# Patient Record
Sex: Female | Born: 1985 | Race: Black or African American | Hispanic: No | Marital: Married | Smoking: Never smoker
Health system: Southern US, Community
[De-identification: ages and names within clinical notes are randomized; demographics above are authoritative.]

## PROBLEM LIST (undated history)

## (undated) DIAGNOSIS — N83209 Unspecified ovarian cyst, unspecified side: Secondary | ICD-10-CM

## (undated) DIAGNOSIS — J45909 Unspecified asthma, uncomplicated: Secondary | ICD-10-CM

## (undated) DIAGNOSIS — E559 Vitamin D deficiency, unspecified: Secondary | ICD-10-CM

## (undated) DIAGNOSIS — F419 Anxiety disorder, unspecified: Secondary | ICD-10-CM

## (undated) DIAGNOSIS — D649 Anemia, unspecified: Secondary | ICD-10-CM

## (undated) DIAGNOSIS — M1990 Unspecified osteoarthritis, unspecified site: Secondary | ICD-10-CM

## (undated) DIAGNOSIS — N809 Endometriosis, unspecified: Secondary | ICD-10-CM

## (undated) DIAGNOSIS — K219 Gastro-esophageal reflux disease without esophagitis: Secondary | ICD-10-CM

## (undated) DIAGNOSIS — T7840XA Allergy, unspecified, initial encounter: Secondary | ICD-10-CM

## (undated) HISTORY — DX: Endometriosis, unspecified: N80.9

## (undated) HISTORY — DX: Allergy, unspecified, initial encounter: T78.40XA

## (undated) HISTORY — DX: Unspecified asthma, uncomplicated: J45.909

## (undated) HISTORY — PX: HERNIA REPAIR: SHX51

## (undated) HISTORY — PX: WISDOM TOOTH EXTRACTION: SHX21

## (undated) HISTORY — DX: Anxiety disorder, unspecified: F41.9

## (undated) HISTORY — DX: Anemia, unspecified: D64.9

## (undated) HISTORY — DX: Unspecified osteoarthritis, unspecified site: M19.90

---
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
## 2011-10-11 ENCOUNTER — Ambulatory Visit (INDEPENDENT_AMBULATORY_CARE_PROVIDER_SITE_OTHER): Payer: BC Managed Care – PPO

## 2011-10-11 DIAGNOSIS — M79609 Pain in unspecified limb: Secondary | ICD-10-CM

## 2011-10-11 DIAGNOSIS — B079 Viral wart, unspecified: Secondary | ICD-10-CM

## 2011-11-13 ENCOUNTER — Ambulatory Visit (INDEPENDENT_AMBULATORY_CARE_PROVIDER_SITE_OTHER): Payer: BC Managed Care – PPO | Admitting: Family Medicine

## 2011-11-13 DIAGNOSIS — N898 Other specified noninflammatory disorders of vagina: Secondary | ICD-10-CM

## 2011-11-13 DIAGNOSIS — K219 Gastro-esophageal reflux disease without esophagitis: Secondary | ICD-10-CM | POA: Insufficient documentation

## 2011-11-13 DIAGNOSIS — N939 Abnormal uterine and vaginal bleeding, unspecified: Secondary | ICD-10-CM

## 2011-11-13 DIAGNOSIS — J329 Chronic sinusitis, unspecified: Secondary | ICD-10-CM

## 2011-11-13 DIAGNOSIS — J302 Other seasonal allergic rhinitis: Secondary | ICD-10-CM

## 2011-11-13 DIAGNOSIS — J019 Acute sinusitis, unspecified: Secondary | ICD-10-CM

## 2011-11-13 DIAGNOSIS — Z9109 Other allergy status, other than to drugs and biological substances: Secondary | ICD-10-CM

## 2011-11-13 HISTORY — DX: Gastro-esophageal reflux disease without esophagitis: K21.9

## 2011-11-13 MED ORDER — AMOXICILLIN 875 MG PO TABS: 875.0000 mg | ORAL_TABLET | Freq: Two times a day (BID) | ORAL | Status: AC

## 2011-11-13 MED ORDER — FLUTICASONE PROPIONATE 50 MCG/ACT NA SUSP: 2.0000 | Freq: Every day | NASAL | Status: DC

## 2011-11-13 MED ORDER — HYDROCODONE-HOMATROPINE 5-1.5 MG/5ML PO SYRP: 5.0000 mL | ORAL_SOLUTION | Freq: Three times a day (TID) | ORAL | Status: AC | PRN

## 2011-11-13 NOTE — Progress Notes (Signed)
  Subjective:    Patient ID: Stacey Key, female    DOB: Feb 15, 1986, 26 y.o.   MRN: [id number]  Sore Throat  This is a new problem. The current episode started 1 to 4 weeks ago. The problem has been gradually worsening. Neither side of throat is experiencing more pain than the other. There has been no fever. The pain is at a severity of 6/10. The pain is moderate. Associated symptoms include congestion, coughing (productive of yellow sputum), ear pain and headaches.  Cough Associated symptoms include ear pain and headaches.  Vaginal Bleeding Associated symptoms include headaches.  On OCP for dysmenorrhea,recent menses 10/28/11 heavy lasting 5 days. New to current OCP(recently started on 2nd pack).  OCP changed as prior OCP(?name) not  reducing duration/ severity of bleeding.   Review of Systems  HENT: Positive for ear pain and congestion.   Respiratory: Positive for cough (productive of yellow sputum).   Genitourinary: Positive for vaginal bleeding.  Neurological: Positive for headaches.       Objective:   Physical Exam  Constitutional: She appears well-developed and well-nourished.  HENT:  Head: Head is with raccoon's eyes.  Right Ear: Tympanic membrane is retracted.  Left Ear: Tympanic membrane is retracted.  Nose: Mucosal edema (blue swollen turbinates) present.  Mouth/Throat: Posterior oropharyngeal edema: copious yellow pnd.  Neck: Neck supple. No thyromegaly present.  Cardiovascular: Normal rate, regular rhythm and normal heart sounds.   Pulmonary/Chest: Effort normal and breath sounds normal.  Abdominal: Soft. Bowel sounds are normal. She exhibits no mass.  Lymphadenopathy:    Cervical adenopathy: shoddy anterior nodes.          Assessment & Plan:   1. GERD (gastroesophageal reflux disease)    2. Sinusitis  amoxicillin (AMOXIL) 875 MG tablet, HYDROcodone-homatropine (HYCODAN) 5-1.5 MG/5ML syrup  3. Seasonal allergies  fluticasone (FLONASE) 50 MCG/ACT nasal  spray  4. Vaginal bleeding  Complete 3 packs of OCP.  If menses still heavy please RTC for follow up with Dr. Audria Nine   Follow up INB in 2 days

## 2011-12-05 ENCOUNTER — Ambulatory Visit: Payer: Self-pay | Admitting: Family Medicine

## 2011-12-27 ENCOUNTER — Telehealth: Payer: Self-pay

## 2011-12-27 NOTE — Telephone Encounter (Signed)
Pt called to ask for RFs of her BCP that Dr Audria Nine had switched her to after Sprintec didn't work for her. She had been given a 3 month trial to see how it did and the Dr Audria Nine was going to give her add'l RFs if it worked well. Pt stated that she is doing well on it. Dr Audria Nine, it looks like you switched pt to Lo estrin Fe on Oct 4. Pt stated she is taking Junel Fe. I put the pt's chart in your box, wanted to make sure you wanted to RF for rest of the year since her PAP on 06/06/11. Pt wants RFs to [facility]

## 2011-12-31 ENCOUNTER — Other Ambulatory Visit: Payer: Self-pay | Admitting: Family Medicine

## 2011-12-31 MED ORDER — NORETHIN ACE-ETH ESTRAD-FE 1-20 MG-MCG PO TABS: 1.0000 | ORAL_TABLET | Freq: Every day | ORAL | Status: DC

## 2011-12-31 NOTE — Telephone Encounter (Signed)
Medication has been refilled and available for pick-up from [facility] (refilled for 1 year)

## 2011-12-31 NOTE — Progress Notes (Signed)
OCP (Junel Fe) has been prescribed and routed to [facility]'s [facility]. She has RFs for 1 year.

## 2012-01-01 NOTE — Telephone Encounter (Signed)
LMOM that RFs were sent in. 

## 2012-01-10 ENCOUNTER — Ambulatory Visit (HOSPITAL_COMMUNITY)
Admission: RE | Admit: 2012-01-10 | Discharge: 2012-01-10 | Disposition: A | Discharge status: Home / Self Care | Payer: BC Managed Care – PPO | Source: Ambulatory Visit | Attending: Family Medicine | Admitting: Family Medicine

## 2012-01-10 ENCOUNTER — Ambulatory Visit (INDEPENDENT_AMBULATORY_CARE_PROVIDER_SITE_OTHER): Payer: BC Managed Care – PPO | Admitting: Family Medicine

## 2012-01-10 VITALS — BP 118/78 | HR 79 | Temp 98.2°F | Resp 16 | Ht 68.0 in | Wt 136.6 lb

## 2012-01-10 DIAGNOSIS — M79609 Pain in unspecified limb: Secondary | ICD-10-CM

## 2012-01-10 DIAGNOSIS — M79669 Pain in unspecified lower leg: Secondary | ICD-10-CM

## 2012-01-10 DIAGNOSIS — S91009A Unspecified open wound, unspecified ankle, initial encounter: Secondary | ICD-10-CM

## 2012-01-10 DIAGNOSIS — S81009A Unspecified open wound, unspecified knee, initial encounter: Secondary | ICD-10-CM

## 2012-01-10 DIAGNOSIS — M7989 Other specified soft tissue disorders: Secondary | ICD-10-CM

## 2012-01-10 MED ORDER — NABUMETONE 750 MG PO TABS: 750.0000 mg | ORAL_TABLET | Freq: Two times a day (BID) | ORAL | Status: DC

## 2012-01-10 NOTE — Progress Notes (Signed)
Subjective: Patient is here with a left ankle injury. Yesterday she went to the screen door and slammed his finger across the back of her heel. She jerked backwards when it into her, but then kept going forward because she needs to turn the alarm off. She works at [employer] when she is on her feet for long hours. She is a Consulting civil engineer at [employer]. No prior injuries or problems with her ankle before this. It hurts a lot today all the way up into her calf did sustain a laceration across her Achilles area this lead. She showed me the photograph of that.  Objective pleasant young lady in no acute distress is alert she is sitting still. She has to extreme tenderness of the calf, with positive Homans sign. There is a small laceration of the skin overlying the lower Achilles area which is not requiring any special care. His only about 6 mm. Pedal pulses are present. Edema is absent.  Assessment: Include left heel Calf pain, and post trauma in a patient on oral contraception.  Plan: Ultrasound of calf Often 750 twice a day Elevate and rest her leg. Her crutches. Labor for 3 days. Thank you Relafen

## 2012-01-10 NOTE — Progress Notes (Signed)
Call report of lower extremity US negative. Patient advised to follow current treatment plan and follow up as directed.  Eula Listen, PA-C 01/10/2012 4:05 PM

## 2012-01-10 NOTE — Patient Instructions (Addendum)
Rest the ankle for the next few days. Will leave off work through Saturday. Advise crutches.  If the study reveals a blood clot we would have to do things differently, but I have prescribed some medication for the pain and inflammation.  Venous Doppler ultrasound at [facility] hospital

## 2012-03-30 ENCOUNTER — Inpatient Hospital Stay (HOSPITAL_COMMUNITY)
Admission: AD | Admit: 2012-03-30 | Discharge: 2012-03-31 | Disposition: A | Discharge status: Home / Self Care | Payer: BC Managed Care – PPO | Source: Ambulatory Visit | Attending: Obstetrics and Gynecology | Admitting: Obstetrics and Gynecology

## 2012-03-30 ENCOUNTER — Ambulatory Visit (HOSPITAL_COMMUNITY)
Admission: RE | Admit: 2012-03-30 | Discharge: 2012-03-30 | Disposition: A | Discharge status: Home / Self Care | Payer: BC Managed Care – PPO | Source: Ambulatory Visit | Attending: Emergency Medicine | Admitting: Emergency Medicine

## 2012-03-30 ENCOUNTER — Encounter (HOSPITAL_COMMUNITY): Payer: Self-pay | Admitting: *Deleted

## 2012-03-30 ENCOUNTER — Ambulatory Visit (INDEPENDENT_AMBULATORY_CARE_PROVIDER_SITE_OTHER): Payer: BC Managed Care – PPO | Admitting: Emergency Medicine

## 2012-03-30 ENCOUNTER — Inpatient Hospital Stay (HOSPITAL_COMMUNITY): Payer: BC Managed Care – PPO

## 2012-03-30 VITALS — BP 100/65 | HR 66 | Temp 98.2°F | Resp 16 | Ht 68.0 in | Wt 133.6 lb

## 2012-03-30 DIAGNOSIS — N83299 Other ovarian cyst, unspecified side: Secondary | ICD-10-CM

## 2012-03-30 DIAGNOSIS — R109 Unspecified abdominal pain: Secondary | ICD-10-CM

## 2012-03-30 DIAGNOSIS — N83209 Unspecified ovarian cyst, unspecified side: Secondary | ICD-10-CM | POA: Insufficient documentation

## 2012-03-30 DIAGNOSIS — R300 Dysuria: Secondary | ICD-10-CM

## 2012-03-30 DIAGNOSIS — N949 Unspecified condition associated with female genital organs and menstrual cycle: Secondary | ICD-10-CM | POA: Insufficient documentation

## 2012-03-30 LAB — POCT UA - MICROSCOPIC ONLY
Casts, Ur, LPF, POC: NEGATIVE
Crystals, Ur, HPF, POC: NEGATIVE
Mucus, UA: POSITIVE

## 2012-03-30 LAB — URINALYSIS, ROUTINE W REFLEX MICROSCOPIC
Bilirubin Urine: NEGATIVE
Ketones, ur: NEGATIVE mg/dL
Nitrite: NEGATIVE
Protein, ur: NEGATIVE mg/dL
Specific Gravity, Urine: <1.005 — ABNORMAL LOW (ref 1.005–1.030)
Urobilinogen, UA: 0.2 mg/dL (ref 0.0–1.0)

## 2012-03-30 LAB — POCT CBC
Granulocyte percent: 49 %G (ref 37–80)
MCH, POC: 28 pg (ref 27–31.2)
MID (cbc): 0.4 (ref 0–0.9)
MPV: 9 fL (ref 0–99.8)
POC MID %: 6.4 %M (ref 0–12)
Platelet Count, POC: 315 10*3/uL (ref 142–424)
RBC: 4.64 M/uL (ref 4.04–5.48)
RDW, POC: 12.9 %
WBC: 7 10*3/uL (ref 4.6–10.2)

## 2012-03-30 LAB — POCT URINALYSIS DIPSTICK
Blood, UA: NEGATIVE
Protein, UA: NEGATIVE
Spec Grav, UA: 1.025
Urobilinogen, UA: 0.2

## 2012-03-30 MED ORDER — SODIUM CHLORIDE 0.9 % IV SOLN
INTRAVENOUS | Status: DC
  Administered 2012-03-30: via INTRAVENOUS

## 2012-03-30 MED ORDER — DEXTROSE 5 % IV SOLN
1.0000 g | Freq: Once | INTRAVENOUS | Status: AC
  Administered 2012-03-31: 1 g via INTRAVENOUS
  Filled 2012-03-30: qty 10

## 2012-03-30 MED ORDER — HYDROMORPHONE HCL PF 1 MG/ML IJ SOLN
1.0000 mg | Freq: Once | INTRAMUSCULAR | Status: AC
  Administered 2012-03-31: 1 mg via INTRAVENOUS
  Filled 2012-03-30: qty 1

## 2012-03-30 MED ORDER — ONDANSETRON HCL 4 MG/2ML IJ SOLN
4.0000 mg | Freq: Once | INTRAMUSCULAR | Status: AC
  Administered 2012-03-31: 4 mg via INTRAVENOUS
  Filled 2012-03-30: qty 2

## 2012-03-30 MED ORDER — IOHEXOL 300 MG/ML  SOLN
80.0000 mL | Freq: Once | INTRAMUSCULAR | Status: AC | PRN
  Administered 2012-03-30: 80 mL via INTRAVENOUS

## 2012-03-30 NOTE — MAU Note (Signed)
Pt seen at [facility], dx with ruptured ovarian cyst, sent to MAU for further evaluation.

## 2012-03-30 NOTE — MAU Provider Note (Signed)
History     CSN: [id number]  Arrival date & time 03/30/12  2114   None     Chief Complaint  Patient presents with  . Abdominal Pain  . Ovarian Cyst    HPI Stacey Key is a 26 y.o. female who was sent to [facility]U for [facility] for evaluation of ruptured ovarian cyst. The patient is not pregnant. LMP 03/23/12 and was normal , last pap < one year ago and was normal , last sexual intercourse = never. Takes OC's for severe cramping with menses. Onset of pain in lower abdomen at 5 am today. Sudden onset across lower abdomen. Rates the the pain as 10/10 at onset and 6/10 now. Nausea but no vomiting. The history was provided by the patient and her medical record.  Past Medical History  Diagnosis Date  . No pertinent past medical history     Past Surgical History  Procedure Date  . Hernia repair     Family History  Problem Relation Age of Onset  . Other Neg Hx     History  Substance Use Topics  . Smoking status: Never Smoker   . Smokeless tobacco: Not on file  . Alcohol Use: No    OB History    Grav Para Term Preterm Abortions TAB SAB Ect Mult Living   0               Review of Systems  Constitutional: Negative for fever, chills, diaphoresis and fatigue.  HENT: Negative for ear pain, congestion, sore throat, facial swelling, neck pain, neck stiffness, dental problem and sinus pressure.   Eyes: Negative for photophobia, pain, discharge and visual disturbance.  Respiratory: Negative for cough, chest tightness and wheezing.   Cardiovascular: Negative for chest pain and palpitations.  Gastrointestinal: Positive for nausea, abdominal pain and constipation. Negative for vomiting, diarrhea and abdominal distention.  Genitourinary: Positive for frequency. Negative for dysuria, flank pain, vaginal bleeding, vaginal discharge and difficulty urinating. Pelvic pain: pain with urination.  Musculoskeletal: Positive for back pain. Negative for myalgias and gait problem.    Skin: Negative for color change and rash.  Neurological: Negative for dizziness, speech difficulty, weakness, light-headedness, numbness and headaches.  Psychiatric/Behavioral: Negative for confusion and agitation. The patient is not nervous/anxious.     Allergies  Review of patient's allergies indicates no known allergies.  Home Medications   Current Outpatient Rx  Name Route Sig Dispense Refill  . HYDROCODONE-ACETAMINOPHEN 5-325 MG PO TABS Oral Take 1 tablet by mouth every 4 (four) hours as needed for pain. 10 tablet 0  . IBUPROFEN 600 MG PO TABS Oral Take 1 tablet (600 mg total) by mouth every 6 (six) hours as needed for pain. 30 tablet 0  . PROMETHAZINE HCL 25 MG PO TABS Oral Take 0.5 tablets (12.5 mg total) by mouth every 6 (six) hours as needed for nausea. 20 tablet 0    BP 117/72  Pulse 83  Temp 98.6 F (37 C) (Oral)  Resp 16  Ht 5\' 8"  (1.727 m)  Wt 135 lb 9.6 oz (61.508 kg)  BMI 20.62 kg/m2  LMP 03/23/2012  Physical Exam  Nursing note and vitals reviewed. Constitutional: She is oriented to person, place, and time. She appears well-developed and well-nourished. No distress.  HENT:  Head: Normocephalic.  Eyes: EOM are normal.  Neck: Neck supple.  Cardiovascular: Normal rate.   Pulmonary/Chest: Effort normal.  Abdominal: Soft. There is tenderness in the right lower quadrant and left lower quadrant.  There is no rebound and no CVA tenderness.  Musculoskeletal: Normal range of motion.  Neurological: She is alert and oriented to person, place, and time. No cranial nerve deficit.  Skin: Skin is warm and dry.  Psychiatric: She has a normal mood and affect. Her behavior is normal. Judgment and thought content normal.    Results for orders placed during the hospital encounter of 03/30/12 (from the past 24 hour(s))  URINALYSIS, ROUTINE W REFLEX MICROSCOPIC     Status: Abnormal   Collection Time   03/30/12  9:55 PM      Component Value Range   Color, Urine YELLOW  YELLOW    APPearance CLEAR  CLEAR   Specific Gravity, Urine <1.005 (*) 1.005 - 1.030   pH 6.0  5.0 - 8.0   Glucose, UA NEGATIVE  NEGATIVE mg/dL   Hgb urine dipstick NEGATIVE  NEGATIVE   Bilirubin Urine NEGATIVE  NEGATIVE   Ketones, ur NEGATIVE  NEGATIVE mg/dL   Protein, ur NEGATIVE  NEGATIVE mg/dL   Urobilinogen, UA 0.2  0.0 - 1.0 mg/dL   Nitrite NEGATIVE  NEGATIVE   Leukocytes, UA NEGATIVE  NEGATIVE   ED Course  Procedures    US Transvaginal Non-ob  03/30/2012  *RADIOLOGY REPORT*  Clinical Data: Pelvic pain  TRANSABDOMINAL AND TRANSVAGINAL ULTRASOUND OF PELVIS Technique:  Both transabdominal and transvaginal ultrasound examinations of the pelvis were performed. Transabdominal technique was performed for global imaging of the pelvis including uterus, ovaries, adnexal regions, and pelvic cul-de-sac.  It was necessary to proceed with endovaginal exam following the transabdominal exam to visualize the endometrium and adnexa.  Comparison:  03/30/2012 CT  Findings:  Uterus: Measures 8.3 x 3.6 x 5.2 cm.  There are two intramural fibroids, measuring 1.0 by 0.6 x 0.8 cm anteriorly and 1.1 x 1.1 x 1.2 cm of the fundus.  Endometrium: Measures 9 mm in thickness, normal sonographic appearance.  Right ovary:  Measures 2.4 x 1.0 x 1.6 cm.  Left ovary: Measures 6.9 x 3.3 x 3.5 cm, with a heterogeneous echogenicity mass measuring 5.1 x 3.3 x 3.1 cm with internal cystic components and no internal vascularity. There is an adjacent anechoic cyst which has either paraovarian or also arising from the ovary.  Other findings: Small amount of free fluid.  Color Doppler flow with arterial and venous wave forms documented to both ovaries.  IMPRESSION: Color Doppler flow with arterial and venous wave forms documented to both ovaries.  The cystic lesion questioned on today's CT corresponds to a complex left ovarian lesion, favored to reflect a hemorrhagic cyst. Recommend 6-week ultrasound follow-up to document resolution.  Small  amount of free fluid.  Original Report Authenticated By: Waneta Martins, M.D.   US Pelvis Complete  03/30/2012  *RADIOLOGY REPORT*  Clinical Data: Pelvic pain  TRANSABDOMINAL AND TRANSVAGINAL ULTRASOUND OF PELVIS Technique:  Both transabdominal and transvaginal ultrasound examinations of the pelvis were performed. Transabdominal technique was performed for global imaging of the pelvis including uterus, ovaries, adnexal regions, and pelvic cul-de-sac.  It was necessary to proceed with endovaginal exam following the transabdominal exam to visualize the endometrium and adnexa.  Comparison:  03/30/2012 CT  Findings:  Uterus: Measures 8.3 x 3.6 x 5.2 cm.  There are two intramural fibroids, measuring 1.0 by 0.6 x 0.8 cm anteriorly and 1.1 x 1.1 x 1.2 cm of the fundus.  Endometrium: Measures 9 mm in thickness, normal sonographic appearance.  Right ovary:  Measures 2.4 x 1.0 x 1.6 cm.  Left ovary:  Measures 6.9 x 3.3 x 3.5 cm, with a heterogeneous echogenicity mass measuring 5.1 x 3.3 x 3.1 cm with internal cystic components and no internal vascularity. There is an adjacent anechoic cyst which has either paraovarian or also arising from the ovary.  Other findings: Small amount of free fluid.  Color Doppler flow with arterial and venous wave forms documented to both ovaries.  IMPRESSION: Color Doppler flow with arterial and venous wave forms documented to both ovaries.  The cystic lesion questioned on today's CT corresponds to a complex left ovarian lesion, favored to reflect a hemorrhagic cyst. Recommend 6-week ultrasound follow-up to document resolution.  Small amount of free fluid.  Original Report Authenticated By: Waneta Martins, M.D.   Ct Abdomen Pelvis W Contrast  03/30/2012  *RADIOLOGY REPORT*  Clinical Data: Right lower quadrant pain.  CT ABDOMEN AND PELVIS WITH CONTRAST  Technique:  Multidetector CT imaging of the abdomen and pelvis was performed following the standard protocol during bolus  administration of intravenous contrast.  Contrast: 80mL OMNIPAQUE IOHEXOL 300 MG/ML  SOLN  Comparison: None.  Findings: Lung bases are clear.  No effusions.  Heart is normal size.  Liver, gallbladder, spleen, pancreas, adrenals and kidneys are normal.  Moderate stool throughout the colon.  Urinary bladder and uterus are unremarkable.  Moderate free fluid in the pelvis.  Dilated tubular structure in the left adnexa measuring 5.4 x 3.0 cm, suspect hydrosalpinx.  No right adnexal mass.  Appendix not definitively seen.  No inflammatory process or secondary signs of appendicitis.  Small bowel is decompressed.  No free air or adenopathy.  Aorta is normal caliber.  Incidentally noted is a circumaortic left renal vein.  No acute bony abnormality.  IMPRESSION: Dilated tubular structure in the left adnexa, likely hydrosalpinx. Moderate free fluid in the pelvis.  Appendix not definitively visualized.  No secondary signs of appendicitis.  Moderate stool burden in the colon.  Original Report Authenticated By: Cyndie Chime, M.D.   [place] Art/ven Flow Abd Pelv Doppler  03/30/2012  *RADIOLOGY REPORT*  Clinical Data: Pelvic pain  TRANSABDOMINAL AND TRANSVAGINAL ULTRASOUND OF PELVIS Technique:  Both transabdominal and transvaginal ultrasound examinations of the pelvis were performed. Transabdominal technique was performed for global imaging of the pelvis including uterus, ovaries, adnexal regions, and pelvic cul-de-sac.  It was necessary to proceed with endovaginal exam following the transabdominal exam to visualize the endometrium and adnexa.  Comparison:  03/30/2012 CT  Findings:  Uterus: Measures 8.3 x 3.6 x 5.2 cm.  There are two intramural fibroids, measuring 1.0 by 0.6 x 0.8 cm anteriorly and 1.1 x 1.1 x 1.2 cm of the fundus.  Endometrium: Measures 9 mm in thickness, normal sonographic appearance.  Right ovary:  Measures 2.4 x 1.0 x 1.6 cm.  Left ovary: Measures 6.9 x 3.3 x 3.5 cm, with a heterogeneous echogenicity mass  measuring 5.1 x 3.3 x 3.1 cm with internal cystic components and no internal vascularity. There is an adjacent anechoic cyst which has either paraovarian or also arising from the ovary.  Other findings: Small amount of free fluid.  Color Doppler flow with arterial and venous wave forms documented to both ovaries.  IMPRESSION: Color Doppler flow with arterial and venous wave forms documented to both ovaries.  The cystic lesion questioned on today's CT corresponds to a complex left ovarian lesion, favored to reflect a hemorrhagic cyst. Recommend 6-week ultrasound follow-up to document resolution.  Small amount of free fluid.  Original Report Authenticated By: Waneta Martins, M.D.  Assessment: 25 y.o. female with complex left ovarian hemorrhagic cyst  Plan:  Dilaudid 1 mg. IV   Rocephin 1 gram IV   Zofran 4 mg IV   MDM: 00:30 patient feeling much better. Discussed results with Dr. Emelda Fear will give Toradol IV and d/c patient home with Hydrocodone and ibuprofen. Follow up in GYN Clinic in 6 weeks. Return here as needed.   I have reviewed this patient's vital signs, nurses notes, appropriate labs and imaging. I have discussed in detail the results with the patient and need for follow up. Patient voices understanding.

## 2012-03-30 NOTE — Progress Notes (Signed)
Subjective:    Patient ID: Stacey Key, female    DOB: 1986-01-13, 26 y.o.   MRN: [id number]  HPI patient's pain started at 5 AM this morning she also said some nausea with this pain she does have some pain at the end of urination but no true burning on urination. Patient denies fever or chills. She is on birth control feels for her irregular menses. She finished her menstrual period on Tuesday . She denies ever being sexually active to    Review of Systems     Objective:   Physical Exam  Constitutional: She appears well-developed.  HENT:  Head: Normocephalic.  Eyes: Pupils are equal, round, and reactive to light.  Neck: No thyromegaly present.  Cardiovascular: Normal rate.   Pulmonary/Chest: Effort normal and breath sounds normal.  Abdominal:       The abdomen is bowel sounds are present. There is significant tenderness in the right adnexal area up into the right lower quadrant. There is localized rebound to this area. There is significant guarding to the right lower abdomen and suprapubic area.   Results for orders placed in visit on 03/30/12  POCT UA - MICROSCOPIC ONLY      Component Value Range   WBC, Ur, HPF, POC 3-9     RBC, urine, microscopic 1-2     Bacteria, U Microscopic 1+     Mucus, UA pos     Epithelial cells, urine per micros neg     Crystals, Ur, HPF, POC neg     Casts, Ur, LPF, POC neg     Yeast, UA neg    POCT URINALYSIS DIPSTICK      Component Value Range   Color, UA yellow     Clarity, UA clear     Glucose, UA neg     Bilirubin, UA neg     Ketones, UA neg     Spec Grav, UA 1.025     Blood, UA neg     pH, UA 6.0     Protein, UA neg     Urobilinogen, UA 0.2     Nitrite, UA neg     Leukocytes, UA Trace    POCT URINE PREGNANCY      Component Value Range   Preg Test, Ur Negative    POCT CBC      Component Value Range   WBC 7.0  4.6 - 10.2 K/uL   Lymph, poc 3.1  0.6 - 3.4   POC LYMPH PERCENT 44.6  10 - 50 %L   MID (cbc) 0.4  0 - 0.9   POC MID  % 6.4  0 - 12 %M   POC Granulocyte 3.4  2 - 6.9   Granulocyte percent 49.0  37 - 80 %G   RBC 4.64  4.04 - 5.48 M/uL   Hemoglobin 13.0  12.2 - 16.2 g/dL   HCT, POC 78.2  95.6 - 47.9 %   MCV 92.0  80 - 97 fL   MCH, POC 28.0  27 - 31.2 pg   MCHC 30.4 (*) 31.8 - 35.4 g/dL   RDW, POC 21.3     Platelet Count, POC 315  142 - 424 K/uL   MPV 9.0  0 - 99.8 fL         Assessment & Plan:  Patient has peritoneal signs in the right lower quadrant with pain normal white count and hemoglobin. Will check CT abdomen and pelvis rule out ruptured cyst versus a cyst with  torsion. Appendicitis is less likely in the absence of fever and elevated white count

## 2012-03-31 MED ORDER — PROMETHAZINE HCL 25 MG PO TABS: 12.5000 mg | ORAL_TABLET | Freq: Four times a day (QID) | ORAL | Status: DC | PRN

## 2012-03-31 MED ORDER — HYDROCODONE-ACETAMINOPHEN 5-325 MG PO TABS: 1.0000 | ORAL_TABLET | ORAL | Status: AC | PRN

## 2012-03-31 MED ORDER — IBUPROFEN 600 MG PO TABS: 600.0000 mg | ORAL_TABLET | Freq: Four times a day (QID) | ORAL | Status: AC | PRN

## 2012-03-31 MED ORDER — KETOROLAC TROMETHAMINE 30 MG/ML IJ SOLN
30.0000 mg | Freq: Once | INTRAMUSCULAR | Status: AC
  Administered 2012-03-31: 30 mg via INTRAVENOUS
  Filled 2012-03-31: qty 1

## 2012-03-31 NOTE — Discharge Instructions (Signed)
Someone will call you from the GYN office to schedule a follow  Up appointment . Return here as needed.  Ovarian Cyst The ovaries are small organs that are on each side of the uterus. The ovaries are the organs that produce the female hormones, estrogen and progesterone. An ovarian cyst is a sac filled with fluid that can vary in its size. It is normal for a small cyst to form in women who are in the childbearing age and who have menstrual periods. This type of cyst is called a follicle cyst that becomes an ovulation cyst (corpus luteum cyst) after it produces the women's egg. It later goes away on its own if the woman does not become pregnant. There are other kinds of ovarian cysts that may cause problems and may need to be treated. The most serious problem is a cyst with cancer. It should be noted that menopausal women who have an ovarian cyst are at a higher risk of it being a cancer cyst. They should be evaluated very quickly, thoroughly and followed closely. This is especially true in menopausal women because of the high rate of ovarian cancer in women in menopause. CAUSES AND TYPES OF OVARIAN CYSTS:  FUNCTIONAL CYST: The follicle/corpus luteum cyst is a functional cyst that occurs every month during ovulation with the menstrual cycle. They go away with the next menstrual cycle if the woman does not get pregnant. Usually, there are no symptoms with a functional cyst.   ENDOMETRIOMA CYST: This cyst develops from the lining of the uterus tissue. This cyst gets in or on the ovary. It grows every month from the bleeding during the menstrual period. It is also called a "chocolate cyst" because it becomes filled with blood that turns brown. This cyst can cause pain in the lower abdomen during intercourse and with your menstrual period.   CYSTADENOMA CYST: This cyst develops from the cells on the outside of the ovary. They usually are not cancerous. They can get very big and cause lower abdomen pain and pain  with intercourse. This type of cyst can twist on itself, cut off its blood supply and cause severe pain. It also can easily rupture and cause a lot of pain.   DERMOID CYST: This type of cyst is sometimes found in both ovaries. They are found to have different kinds of body tissue in the cyst. The tissue includes skin, teeth, hair, and/or cartilage. They usually do not have symptoms unless they get very big. Dermoid cysts are rarely cancerous.   POLYCYSTIC OVARY: This is a rare condition with hormone problems that produces many small cysts on both ovaries. The cysts are follicle-like cysts that never produce an egg and become a corpus luteum. It can cause an increase in body weight, infertility, acne, increase in body and facial hair and lack of menstrual periods or rare menstrual periods. Many women with this problem develop type 2 diabetes. The exact cause of this problem is unknown. A polycystic ovary is rarely cancerous.   THECA LUTEIN CYST: Occurs when too much hormone (human chorionic gonadotropin) is produced and over-stimulates the ovaries to produce an egg. They are frequently seen when doctors stimulate the ovaries for invitro-fertilization (test tube babies).   LUTEOMA CYST: This cyst is seen during pregnancy. Rarely it can cause an obstruction to the birth canal during labor and delivery. They usually go away after delivery.  SYMPTOMS   Pelvic pain or pressure.   Pain during sexual intercourse.   Increasing girth (  swelling) of the abdomen.   Abnormal menstrual periods.   Increasing pain with menstrual periods.   You stop having menstrual periods and you are not pregnant.  DIAGNOSIS  The diagnosis can be made during:  Routine or annual pelvic examination (common).   Ultrasound.   X-ray of the pelvis.   CT Scan.   MRI.   Blood tests.  TREATMENT   Treatment may only be to follow the cyst monthly for 2 to 3 months with your caregiver. Many go away on their own, especially  functional cysts.   May be aspirated (drained) with a long needle with ultrasound, or by laparoscopy (inserting a tube into the pelvis through a small incision).   The whole cyst can be removed by laparoscopy.   Sometimes the cyst may need to be removed through an incision in the lower abdomen.   Hormone treatment is sometimes used to help dissolve certain cysts.   Birth control pills are sometimes used to help dissolve certain cysts.  HOME CARE INSTRUCTIONS  Follow your caregiver's advice regarding:  Medicine.   Follow up visits to evaluate and treat the cyst.   You may need to come back or make an appointment with another caregiver, to find the exact cause of your cyst, if your caregiver is not a gynecologist.   Get your yearly and recommended pelvic examinations and Pap tests.   Let your caregiver know if you have had an ovarian cyst in the past.  SEEK MEDICAL CARE IF:   Your periods are late, irregular, they stop, or are painful.   Your stomach (abdomen) or pelvic pain does not go away.   Your stomach becomes larger or swollen.   You have pressure on your bladder or trouble emptying your bladder completely.   You have painful sexual intercourse.   You have feelings of fullness, pressure, or discomfort in your stomach.   You lose weight for no apparent reason.   You feel generally ill.   You become constipated.   You lose your appetite.   You develop acne.   You have an increase in body and facial hair.   You are gaining weight, without changing your exercise and eating habits.   You think you are pregnant.  SEEK IMMEDIATE MEDICAL CARE IF:   You have increasing abdominal pain.   You feel sick to your stomach (nausea) and/or vomit.   You develop a fever that comes on suddenly.   You develop abdominal pain during a bowel movement.   Your menstrual periods become heavier than usual.  Document Released: 09/17/2005 Document Revised: 09/06/2011 Document  Reviewed: 07/21/2009 [facility] Patient Information 2012 [place], [place].

## 2012-04-02 LAB — URINE CULTURE

## 2012-04-05 ENCOUNTER — Emergency Department (HOSPITAL_COMMUNITY): Payer: BC Managed Care – PPO

## 2012-04-05 ENCOUNTER — Emergency Department (HOSPITAL_COMMUNITY)
Admission: EM | Admit: 2012-04-05 | Discharge: 2012-04-05 | Disposition: A | Discharge status: Home / Self Care | Payer: BC Managed Care – PPO | Attending: Emergency Medicine | Admitting: Emergency Medicine

## 2012-04-05 ENCOUNTER — Encounter (HOSPITAL_COMMUNITY): Payer: Self-pay | Admitting: Emergency Medicine

## 2012-04-05 DIAGNOSIS — Z79899 Other long term (current) drug therapy: Secondary | ICD-10-CM | POA: Insufficient documentation

## 2012-04-05 DIAGNOSIS — R109 Unspecified abdominal pain: Secondary | ICD-10-CM | POA: Insufficient documentation

## 2012-04-05 DIAGNOSIS — M549 Dorsalgia, unspecified: Secondary | ICD-10-CM | POA: Insufficient documentation

## 2012-04-05 HISTORY — DX: Unspecified ovarian cyst, unspecified side: N83.209

## 2012-04-05 MED ORDER — NAPROXEN 500 MG PO TABS: 500.0000 mg | ORAL_TABLET | Freq: Two times a day (BID) | ORAL | Status: DC

## 2012-04-05 MED ORDER — HYDROCODONE-ACETAMINOPHEN 5-325 MG PO TABS
2.0000 | ORAL_TABLET | Freq: Once | ORAL | Status: AC
  Administered 2012-04-05: 2 via ORAL
  Filled 2012-04-05: qty 2

## 2012-04-05 MED ORDER — CYCLOBENZAPRINE HCL 10 MG PO TABS: 10.0000 mg | ORAL_TABLET | Freq: Three times a day (TID) | ORAL | Status: AC | PRN
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
## 2012-04-05 NOTE — ED Notes (Signed)
Pt is crying in pain.

## 2012-04-06 NOTE — [facility] Provider Note (Signed)
Attestation of Attending Supervision of Advanced Practitioner: Evaluation and management procedures were performed by the PA/NP/CNM/OB Fellow under my supervision/collaboration. Chart reviewed and agree with management and plan.  Julienne Vogler V 04/06/2012 11:01 AM

## 2012-04-08 NOTE — ED Provider Notes (Signed)
Medical screening examination/treatment/procedure(s) were performed by non-physician practitioner and as supervising physician I was immediately available for consultation/collaboration.   Benny Lennert, MD 04/08/12 2023

## 2012-05-05 ENCOUNTER — Encounter: Payer: Self-pay | Admitting: Obstetrics and Gynecology

## 2012-05-05 ENCOUNTER — Ambulatory Visit (INDEPENDENT_AMBULATORY_CARE_PROVIDER_SITE_OTHER): Payer: BC Managed Care – PPO | Admitting: Obstetrics and Gynecology

## 2012-05-05 VITALS — BP 115/76 | HR 94 | Temp 97.0°F | Ht 68.0 in | Wt 136.6 lb

## 2012-05-05 DIAGNOSIS — N83202 Unspecified ovarian cyst, left side: Secondary | ICD-10-CM | POA: Insufficient documentation

## 2012-05-05 DIAGNOSIS — N83299 Other ovarian cyst, unspecified side: Secondary | ICD-10-CM

## 2012-05-05 DIAGNOSIS — N83209 Unspecified ovarian cyst, unspecified side: Secondary | ICD-10-CM

## 2012-05-05 HISTORY — DX: Other ovarian cyst, unspecified side: N83.299

## 2012-05-05 NOTE — Progress Notes (Signed)
Patient ID: Fenton Malling, female   DOB: 04/11/1986, 26 y.o.   MRN: [id number] 26 yo G0P0 presenting today as a follow-up from [facility] for evaluation of ovarian cyst. Patient had ultrasound on 03/2012 which demonstrated a normal uterus, and right ovary. There was a 5x3cm complex, left ovarian cyst. Patient reports that since her visit to the MAU in June her pain has improved but she will occasionally experience sharp pains. Patient is currently on OCP.  Pelvic exam: Small uterus, no palpable adnexal mass or  tenderness elicited   A/P 26 yo G0 with a left complex ovarian cyst - Follow-up pelvic ultrasound  - Continue current OCP - RTC in 2-3 weeks

## 2012-05-12 ENCOUNTER — Ambulatory Visit (HOSPITAL_COMMUNITY)
Admission: RE | Admit: 2012-05-12 | Discharge: 2012-05-12 | Disposition: A | Discharge status: Home / Self Care | Payer: BC Managed Care – PPO | Source: Ambulatory Visit | Attending: Obstetrics and Gynecology | Admitting: Obstetrics and Gynecology

## 2012-05-12 DIAGNOSIS — D251 Intramural leiomyoma of uterus: Secondary | ICD-10-CM | POA: Insufficient documentation

## 2012-05-12 DIAGNOSIS — N83299 Other ovarian cyst, unspecified side: Secondary | ICD-10-CM

## 2012-05-12 DIAGNOSIS — N83209 Unspecified ovarian cyst, unspecified side: Secondary | ICD-10-CM | POA: Insufficient documentation

## 2012-05-13 ENCOUNTER — Encounter: Payer: Self-pay | Admitting: Family Medicine

## 2012-05-13 ENCOUNTER — Ambulatory Visit: Payer: BC Managed Care – PPO

## 2012-05-13 ENCOUNTER — Ambulatory Visit (INDEPENDENT_AMBULATORY_CARE_PROVIDER_SITE_OTHER): Payer: BC Managed Care – PPO | Admitting: Family Medicine

## 2012-05-13 VITALS — BP 108/76 | HR 73 | Temp 97.8°F | Resp 16 | Ht 68.0 in | Wt 136.8 lb

## 2012-05-13 DIAGNOSIS — Z0000 Encounter for general adult medical examination without abnormal findings: Secondary | ICD-10-CM

## 2012-05-13 NOTE — Progress Notes (Signed)
@  [facility]@  Patient ID: Stacey Key MRN: [id number], DOB: [birth year], 26 y.o. Date of Encounter: 05/13/2012, 3:51 PM  Primary Physician: No primary provider on file.  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 year old female with history below presents with need for TB test   Past Medical History  Diagnosis Date  . No pertinent past medical history   . Ovarian cyst      Home Meds: Prior to Admission medications   Medication Sig Start Date End Date Taking? Authorizing Provider  cyclobenzaprine (FLEXERIL) 10 MG tablet Take 10 mg by mouth as needed.   Yes Historical Provider, MD  diphenhydrAMINE (BENADRYL) 25 MG tablet Take 25 mg by mouth every 6 (six) hours as needed. allergies   Yes Historical Provider, MD  fluticasone (FLONASE) 50 MCG/ACT nasal spray Place 2 sprays into the nose daily. 11/13/11 11/12/12 Yes Dois Davenport, MD  naproxen (NAPROSYN) 500 MG tablet Take 1 tablet (500 mg total) by mouth 2 (two) times daily with a meal. 04/05/12 04/05/13 Yes Angus Seller, PA  norethindrone-ethinyl estradiol (JUNEL FE 1/20) 1-20 MG-MCG tablet Take 1 tablet by mouth daily. 12/31/11 12/30/12 Yes Maurice March, MD  naproxen sodium (ANAPROX) 220 MG tablet Take 220 mg by mouth 2 (two) times daily with a meal.    Historical Provider, MD  promethazine (PHENERGAN) 25 MG tablet Take 0.5 tablets (12.5 mg total) by mouth every 6 (six) hours as needed for nausea. 03/31/12 04/07/12  Hope Orlene Och, NP    Allergies: No Known Allergies  History   Social History  . Marital Status: Single    Spouse Name: N/A    Number of Children: N/A  . Years of Education: N/A   Occupational History  . Not on file.   Social History Main Topics  . Smoking status: Never Smoker   . Smokeless tobacco: Never Used  . Alcohol Use: No  . Drug Use: No  . Sexually Active: No   Other Topics Concern  . Not on file   Social History Narrative  . No narrative on file     Review of  Systems:  Constitutional: negative for chills, fever, night sweats, weight changes, or fatigue  HEENT: negative for vision changes, hearing loss, congestion, rhinorrhea, ST, epistaxis, or sinus pressure Cardiovascular: negative for chest pain or palpitations Respiratory: negative for hemoptysis, wheezing, shortness of breath, or cough Abdominal: negative for abdominal pain, nausea, vomiting, diarrhea, or constipation Dermatological: negative for rash Neurologic: negative for headache, dizziness, or syncope All other systems reviewed and are otherwise negative with the exception to those above and in the HPI.   Physical Exam:  Blood pressure 108/76, pulse 73, temperature 97.8 F (36.6 C), temperature source Oral, resp. rate 16, height 5\' 8"  (1.727 m), weight 136 lb 12.8 oz (62.052 kg), last menstrual period 04/07/2012, SpO2 100.00%., Body mass index is 20.80 kg/(m^2). General: Well developed, well nourished, in no acute distress. Head: Normocephalic, atraumatic, eyes without discharge, sclera non-icteric Extremities/Skin: Warm and dry. No clubbing or cyanosis. No edema. No rashes or suspicious lesions. Neuro: Alert and oriented X 3. Moves all extremities spontaneously. Gait is normal. CNII-XII grossly in tact. Psych:  Responds to questions appropriately with a normal affect.   Labs:   ASSESSMENT AND PLAN:  26 y.o. year old female with pre-employment screening  1. Healthcare maintenance  TB Skin Test    Signed, Elvina Sidle, MD 05/13/2012 3:51 PM

## 2012-05-15 ENCOUNTER — Other Ambulatory Visit: Payer: Self-pay | Admitting: Obstetrics and Gynecology

## 2012-05-15 ENCOUNTER — Ambulatory Visit (INDEPENDENT_AMBULATORY_CARE_PROVIDER_SITE_OTHER): Payer: BC Managed Care – PPO

## 2012-05-15 ENCOUNTER — Telehealth: Payer: Self-pay

## 2012-05-15 DIAGNOSIS — Z111 Encounter for screening for respiratory tuberculosis: Secondary | ICD-10-CM

## 2012-05-15 DIAGNOSIS — N83201 Unspecified ovarian cyst, right side: Secondary | ICD-10-CM

## 2012-05-15 LAB — TB SKIN TEST
Induration: 0 mm
TB Skin Test: NEGATIVE

## 2012-05-15 NOTE — Telephone Encounter (Signed)
Message copied by Faythe Casa on Thu May 15, 2012  8:43 AM ------      Message from: CONSTANT, Gigi Gin      Created: Thu May 15, 2012  8:25 AM       Please inform patient of ultrasound results which demonstrated a normal left ovary without a cyst but with a new cyst found on her right ovary. She will need a repeat ultrasound in 6-12 weeks to make sure that it has resolved. Patient does not need to keep her 9/9 appointment, unless she desires to discuss a different issue. All that will be done at the 9/9 appointment is discuss the results of her ultrasound and schedule her next ultrasound.            Need repeat ultrasound week of 10/21 and gyn appointment soon after ultrasound is performed.            Thanks      [employer]

## 2012-05-15 NOTE — Telephone Encounter (Signed)
Called pt and informed pt of results of [place] as stated below.  Pt stated understanding.  I informed pt that she did not need to come to her 9/9 appt and that I have scheduled an [place] for 07/21/12 @ 0915 and after that appt she can call to see if the schedule has been released to make a f/u gyn appt. Pt stated understanding and had no further questions.

## 2012-06-09 ENCOUNTER — Encounter: Payer: Self-pay | Admitting: Obstetrics and Gynecology

## 2012-06-09 ENCOUNTER — Ambulatory Visit (INDEPENDENT_AMBULATORY_CARE_PROVIDER_SITE_OTHER): Payer: BC Managed Care – PPO | Admitting: Obstetrics and Gynecology

## 2012-06-09 VITALS — BP 116/74 | HR 88 | Temp 97.8°F | Ht 68.0 in | Wt 137.9 lb

## 2012-06-09 DIAGNOSIS — N83209 Unspecified ovarian cyst, unspecified side: Secondary | ICD-10-CM

## 2012-06-09 DIAGNOSIS — N83201 Unspecified ovarian cyst, right side: Secondary | ICD-10-CM

## 2012-06-09 MED ORDER — NORGESTIMATE-ETH ESTRADIOL 0.25-35 MG-MCG PO TABS: 1.0000 | ORAL_TABLET | Freq: Every day | ORAL | Status: DC

## 2012-06-09 NOTE — Progress Notes (Signed)
Patient ID: Stacey Key, female   DOB: June 18, 1986, 26 y.o.   MRN: [id number] 26 yo G0 presenting today to discuss results of ultrasound ordered to follow-up a left ovarian cyst. Results were reviewed and explained to the patient which demonstrated resolution of the left ovarian cyst but with the presence of a 6 cm complex right ovarian cyst. Explained the need to repeat ultrasound in 6-12 weeks to ensure resolution of this new cyst. Patient currently taking Junel for birth control. Will change to Sprintec

## 2012-06-20 ENCOUNTER — Encounter: Payer: Self-pay | Admitting: Family Medicine

## 2012-06-20 ENCOUNTER — Ambulatory Visit (INDEPENDENT_AMBULATORY_CARE_PROVIDER_SITE_OTHER): Payer: BC Managed Care – PPO | Admitting: Family Medicine

## 2012-06-20 VITALS — BP 102/65 | HR 88 | Temp 98.6°F | Resp 16 | Ht 67.5 in | Wt 137.2 lb

## 2012-06-20 DIAGNOSIS — M1990 Unspecified osteoarthritis, unspecified site: Secondary | ICD-10-CM

## 2012-06-20 DIAGNOSIS — Z0289 Encounter for other administrative examinations: Secondary | ICD-10-CM

## 2012-06-20 MED ORDER — CLOBETASOL PROPIONATE 0.05 % EX CREA: TOPICAL_CREAM | Freq: Two times a day (BID) | CUTANEOUS | Status: DC

## 2012-06-20 NOTE — Patient Instructions (Signed)

## 2012-06-25 DIAGNOSIS — M1990 Unspecified osteoarthritis, unspecified site: Secondary | ICD-10-CM

## 2012-06-25 HISTORY — DX: Unspecified osteoarthritis, unspecified site: M19.90

## 2012-06-25 NOTE — Progress Notes (Signed)
  Subjective:    Patient ID: Stacey Key, female    DOB: [birth year], 26 y.o.   MRN: [id number]  HPI  This 26 y.o. 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 She has   generalized arthritis affecting neck, shoulders, wrists and hands as well as lower extremities.  Otherwise, she is in good health.    GYN exam earlier this year at GYN office. Takes Sprintec OCP daily. No side effects.    Review of Systems  Musculoskeletal: Positive for arthralgias. Negative for myalgias, back pain, joint swelling and gait problem.  All other systems reviewed and are negative.        Objective:   Physical Exam  Nursing note and vitals reviewed. Constitutional: She is oriented to person, place, and time. She appears well-developed and well-nourished. No distress.  HENT:  Head: Normocephalic and atraumatic.  Right Ear: Hearing, tympanic membrane and external ear normal.  Left Ear: Hearing, tympanic membrane, external ear and ear canal normal.  Nose: No nasal deformity or septal deviation.  Mouth/Throat: Uvula is midline, oropharynx is clear and moist and mucous membranes are normal. No oral lesions. Normal dentition.  Eyes: Conjunctivae normal and EOM are normal. Pupils are equal, round, and reactive to light. No scleral icterus.  Neck: Normal range of motion. Neck supple. No thyromegaly present.  Cardiovascular: Normal rate, regular rhythm and normal heart sounds.  Exam reveals no gallop and no friction rub.   No murmur heard. Pulmonary/Chest: Effort normal and breath sounds normal. No respiratory distress.  Abdominal: Soft. Normal appearance and bowel sounds are normal. She exhibits no distension and no mass. There is no hepatosplenomegaly. There is no tenderness. There is no guarding and no CVA tenderness.  Musculoskeletal: Normal range of motion. She exhibits tenderness. She exhibits no edema.  Lymphadenopathy:    She has no cervical adenopathy.  Neurological: She  is alert and oriented to person, place, and time. No cranial nerve deficit. Coordination normal.  Skin: Skin is warm and dry.  Psychiatric: She has a normal mood and affect. Her behavior is normal. Judgment and thought content normal.          Assessment & Plan:   1. 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

## 2012-07-08 ENCOUNTER — Ambulatory Visit (HOSPITAL_COMMUNITY)
Admission: RE | Admit: 2012-07-08 | Discharge: 2012-07-08 | Disposition: A | Discharge status: Home / Self Care | Payer: BC Managed Care – PPO | Source: Ambulatory Visit | Attending: Obstetrics and Gynecology | Admitting: Obstetrics and Gynecology

## 2012-07-08 DIAGNOSIS — N83209 Unspecified ovarian cyst, unspecified side: Secondary | ICD-10-CM | POA: Insufficient documentation

## 2012-07-08 DIAGNOSIS — N83201 Unspecified ovarian cyst, right side: Secondary | ICD-10-CM

## 2012-07-08 DIAGNOSIS — N838 Other noninflammatory disorders of ovary, fallopian tube and broad ligament: Secondary | ICD-10-CM | POA: Insufficient documentation

## 2012-07-08 DIAGNOSIS — D259 Leiomyoma of uterus, unspecified: Secondary | ICD-10-CM | POA: Insufficient documentation

## 2012-07-21 ENCOUNTER — Ambulatory Visit (HOSPITAL_COMMUNITY): Payer: BC Managed Care – PPO

## 2012-09-10 ENCOUNTER — Ambulatory Visit: Payer: BC Managed Care – PPO

## 2012-09-10 ENCOUNTER — Ambulatory Visit (INDEPENDENT_AMBULATORY_CARE_PROVIDER_SITE_OTHER): Payer: BC Managed Care – PPO | Admitting: Emergency Medicine

## 2012-09-10 VITALS — BP 126/76 | HR 81 | Temp 98.4°F | Resp 17 | Ht 68.5 in | Wt 140.0 lb

## 2012-09-10 DIAGNOSIS — M545 Low back pain: Secondary | ICD-10-CM

## 2012-09-10 MED ORDER — CYCLOBENZAPRINE HCL 10 MG PO TABS: ORAL_TABLET | ORAL | Status: DC

## 2012-09-10 MED ORDER — MELOXICAM 15 MG PO TABS: ORAL_TABLET | ORAL | Status: DC
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
## 2012-11-10 ENCOUNTER — Ambulatory Visit: Payer: BC Managed Care – PPO

## 2012-11-10 ENCOUNTER — Ambulatory Visit (INDEPENDENT_AMBULATORY_CARE_PROVIDER_SITE_OTHER): Payer: BC Managed Care – PPO | Admitting: Emergency Medicine

## 2012-11-10 VITALS — BP 93/63 | HR 77 | Temp 98.2°F | Resp 18 | Wt 142.0 lb

## 2012-11-10 DIAGNOSIS — R05 Cough: Secondary | ICD-10-CM

## 2012-11-10 DIAGNOSIS — K219 Gastro-esophageal reflux disease without esophagitis: Secondary | ICD-10-CM

## 2012-11-10 MED ORDER — AZITHROMYCIN 250 MG PO TABS: ORAL_TABLET | ORAL | Status: DC

## 2012-11-10 MED ORDER — HYDROCOD POLST-CHLORPHEN POLST 10-8 MG/5ML PO LQCR: 5.0000 mL | Freq: Two times a day (BID) | ORAL | Status: DC | PRN

## 2012-11-10 MED ORDER — LANSOPRAZOLE 30 MG PO CPDR: 30.0000 mg | DELAYED_RELEASE_CAPSULE | Freq: Every day | ORAL | Status: DC

## 2012-11-10 NOTE — Progress Notes (Signed)
[facility] [address] [place] [address] [phone]  Date:  11/10/2012   Name:  Stacey Key   DOB:  12-07-1985   MRN:  [id number]  PCP:  No primary provider on file.    Chief Complaint: Cough and Chest Pain   History of Present Illness:  Stacey Key is a 27 y.o. very pleasant female patient who presents with the following:  3 month history of cough.  Non productive. No coryza, wheezing or shortness of breath.  Some sore throat.  Denies nausea or vomiting.  No waterbrash.  Not taking medication for GERD currently. No indigestion.  Says cough worse at night.   Patient Active Problem List  Diagnosis  . GERD (gastroesophageal reflux disease)  . Ovarian cyst, complex  . Arthritis    Past Medical History  Diagnosis Date  . No pertinent past medical history   . Ovarian cyst   . Allergy   . Arthritis   . Anemia     Past Surgical History  Procedure Laterality Date  . Hernia repair      History  Substance Use Topics  . Smoking status: Never Smoker   . Smokeless tobacco: Never Used  . Alcohol Use: No    Family History  Problem Relation Age of Onset  . Other Neg Hx   . Anemia Mother   . GER disease Mother   . Arthritis Maternal Grandmother   . Hypertension Maternal Grandmother   . Hypertension Maternal Grandfather   . Clotting disorder Maternal Grandfather   . Hypertension Paternal Grandfather     No Known Allergies  Medication list has been reviewed and updated.  Current Outpatient Prescriptions on File Prior to Visit  Medication Sig Dispense Refill  . fluticasone (FLONASE) 50 MCG/ACT nasal spray Place 2 sprays into the nose daily.  1 g  1  . norgestimate-ethinyl estradiol (ORTHO-CYCLEN,SPRINTEC,PREVIFEM) 0.25-35 MG-MCG tablet Take 1 tablet by mouth daily.  1 Package  11   No current facility-administered medications on file prior to visit.    Review of Systems:  As per HPI, otherwise negative.   Physical  Examination: Filed Vitals:   11/10/12 1328  BP: 93/63  Pulse: 77  Temp: 98.2 F (36.8 C)  Resp: 18   Filed Vitals:   11/10/12 1328  Weight: 142 lb (64.411 kg)   Body mass index is 21.27 kg/(m^2). Ideal Body Weight:    GEN: WDWN, NAD, Non-toxic, A & O x 3 HEENT: Atraumatic, Normocephalic. Neck supple. No masses, No LAD. Ears and Nose: No external deformity. CV: RRR, No M/G/R. No JVD. No thrill. No extra heart sounds. PULM: CTA B, no wheezes, crackles, rhonchi. No retractions. No resp. distress. No accessory muscle use. ABD: S, NT, ND, +BS. No rebound. No HSM. EXTR: No c/c/e NEURO Normal gait.  PSYCH: Normally interactive. Conversant. Not depressed or anxious appearing.  Calm demeanor.     Assessment and Plan: Cough Likely related to reflux Will give round of zithromax for pertussis in case Follow up in one month if not improved  Carmelina Dane, MD

## 2012-11-30 ENCOUNTER — Encounter (HOSPITAL_COMMUNITY): Payer: Self-pay | Admitting: Emergency Medicine

## 2012-11-30 ENCOUNTER — Emergency Department (HOSPITAL_COMMUNITY): Payer: BC Managed Care – PPO

## 2012-11-30 ENCOUNTER — Emergency Department (HOSPITAL_COMMUNITY)
Admission: EM | Admit: 2012-11-30 | Discharge: 2012-11-30 | Disposition: A | Discharge status: Home / Self Care | Payer: BC Managed Care – PPO | Attending: Emergency Medicine | Admitting: Emergency Medicine

## 2012-11-30 DIAGNOSIS — N390 Urinary tract infection, site not specified: Secondary | ICD-10-CM | POA: Insufficient documentation

## 2012-11-30 DIAGNOSIS — Z8742 Personal history of other diseases of the female genital tract: Secondary | ICD-10-CM | POA: Insufficient documentation

## 2012-11-30 DIAGNOSIS — Z862 Personal history of diseases of the blood and blood-forming organs and certain disorders involving the immune mechanism: Secondary | ICD-10-CM | POA: Insufficient documentation

## 2012-11-30 DIAGNOSIS — R55 Syncope and collapse: Secondary | ICD-10-CM | POA: Insufficient documentation

## 2012-11-30 DIAGNOSIS — M549 Dorsalgia, unspecified: Secondary | ICD-10-CM | POA: Insufficient documentation

## 2012-11-30 DIAGNOSIS — M129 Arthropathy, unspecified: Secondary | ICD-10-CM | POA: Insufficient documentation

## 2012-11-30 DIAGNOSIS — Z79899 Other long term (current) drug therapy: Secondary | ICD-10-CM | POA: Insufficient documentation

## 2012-11-30 DIAGNOSIS — R5381 Other malaise: Secondary | ICD-10-CM | POA: Insufficient documentation

## 2012-11-30 DIAGNOSIS — Z3202 Encounter for pregnancy test, result negative: Secondary | ICD-10-CM | POA: Insufficient documentation

## 2012-11-30 HISTORY — DX: Vitamin D deficiency, unspecified: E55.9

## 2012-11-30 LAB — URINE MICROSCOPIC-ADD ON

## 2012-11-30 LAB — CBC WITH DIFFERENTIAL/PLATELET
Basophils Absolute: 0 10*3/uL (ref 0.0–0.1)
Basophils Relative: 0 % (ref 0–1)
Lymphocytes Relative: 31 % (ref 12–46)
MCHC: 32.5 g/dL (ref 30.0–36.0)
Neutro Abs: 3.3 10*3/uL (ref 1.7–7.7)
Platelets: 321 10*3/uL (ref 150–400)
RDW: 12.3 % (ref 11.5–15.5)
WBC: 5.3 10*3/uL (ref 4.0–10.5)

## 2012-11-30 LAB — GLUCOSE, CAPILLARY: Glucose-Capillary: 87 mg/dL (ref 70–99)

## 2012-11-30 LAB — URINALYSIS, ROUTINE W REFLEX MICROSCOPIC
Bilirubin Urine: NEGATIVE
Glucose, UA: NEGATIVE mg/dL
Hgb urine dipstick: NEGATIVE
Specific Gravity, Urine: 1.015 (ref 1.005–1.030)

## 2012-11-30 LAB — PREGNANCY, URINE: Preg Test, Ur: NEGATIVE

## 2012-11-30 LAB — COMPREHENSIVE METABOLIC PANEL
ALT: 11 U/L (ref 0–35)
AST: 20 U/L (ref 0–37)
Alkaline Phosphatase: 46 U/L (ref 39–117)
CO2: 24 mEq/L (ref 19–32)
Calcium: 9.6 mg/dL (ref 8.4–10.5)
GFR calc non Af Amer: >90 mL/min (ref >90)
Potassium: 3.5 mEq/L (ref 3.5–5.1)
Sodium: 137 mEq/L (ref 135–145)
Total Protein: 7.9 g/dL (ref 6.0–8.3)

## 2012-11-30 MED ORDER — SULFAMETHOXAZOLE-TRIMETHOPRIM 800-160 MG PO TABS: 1.0000 | ORAL_TABLET | Freq: Two times a day (BID) | ORAL | Status: DC

## 2012-11-30 MED ORDER — SODIUM CHLORIDE 0.9 % IV BOLUS (SEPSIS)
1000.0000 mL | Freq: Once | INTRAVENOUS | Status: AC
  Administered 2012-11-30: 1000 mL via INTRAVENOUS

## 2012-11-30 MED ORDER — ACETAMINOPHEN 325 MG PO TABS
650.0000 mg | ORAL_TABLET | Freq: Once | ORAL | Status: AC
  Administered 2012-11-30: 650 mg via ORAL
  Filled 2012-11-30: qty 2

## 2012-11-30 MED ORDER — AMMONIA AROMATIC IN INHA
0.3000 mL | Freq: Once | RESPIRATORY_TRACT | Status: AC
  Administered 2012-11-30: 0.3 mL via RESPIRATORY_TRACT
  Filled 2012-11-30: qty 10

## 2012-11-30 NOTE — ED Provider Notes (Signed)
History     CSN: [id number]  Arrival date & time 11/30/12  1413   First MD Initiated Contact with Patient 11/30/12 1432      Chief Complaint  Patient presents with  . Loss of Consciousness    (Consider location/radiation/quality/duration/timing/severity/associated sxs/prior treatment) HPI Comments: Pt states that she was singing at church and got very hot and she passed out:pt states that her father carried her to the car and then inside the hospital:pt states that she has done this one other time:pt denies cp, sob, confusion with the episode:pt states that she just feels generally weak at this time put she feels better:pt denies n/v/d  The history is provided by the patient. No language interpreter was used.    Past Medical History  Diagnosis Date  . No pertinent past medical history   . Ovarian cyst   . Allergy   . Arthritis   . Anemia   . Vitamin D deficiency     Past Surgical History  Procedure Laterality Date  . Hernia repair      Family History  Problem Relation Age of Onset  . Other Neg Hx   . Anemia Mother   . GER disease Mother   . Arthritis Maternal Grandmother   . Hypertension Maternal Grandmother   . Hypertension Maternal Grandfather   . Clotting disorder Maternal Grandfather   . Hypertension Paternal Grandfather     History  Substance Use Topics  . Smoking status: Never Smoker   . Smokeless tobacco: Never Used  . Alcohol Use: No    OB History   Grav Para Term Preterm Abortions TAB SAB Ect Mult Living   0               Review of Systems  Constitutional: Negative.   Respiratory: Negative.   Cardiovascular: Negative.     Allergies  Review of patient's allergies indicates no known allergies.  Home Medications   Current Outpatient Rx  Name  Route  Sig  Dispense  Refill  . azithromycin (ZITHROMAX) 250 MG tablet      Take 2 tabs PO x 1 dose, then 1 tab PO QD x 4 days   6 tablet   0   . chlorpheniramine-HYDROcodone (TUSSIONEX  PENNKINETIC ER) 10-8 MG/5ML LQCR   Oral   Take 5 mLs by mouth every 12 (twelve) hours as needed (cough).   60 mL   0   . EXPIRED: fluticasone (FLONASE) 50 MCG/ACT nasal spray   Nasal   Place 2 sprays into the nose daily.   1 g   1   . lansoprazole (PREVACID) 30 MG capsule   Oral   Take 1 capsule (30 mg total) by mouth daily.   30 capsule   2   . norgestimate-ethinyl estradiol (ORTHO-CYCLEN,SPRINTEC,PREVIFEM) 0.25-35 MG-MCG tablet   Oral   Take 1 tablet by mouth daily.   1 Package   11     BP 119/82  Pulse 94  Temp(Src) 98.6 F (37 C) (Rectal)  Resp 20  SpO2 100%  LMP 11/24/2012  Physical Exam  Nursing note and vitals reviewed. Constitutional: She is oriented to person, place, and time. She appears well-developed and well-nourished.  HENT:  Head: Atraumatic.  Eyes: Conjunctivae and EOM are normal.  Neck: Normal range of motion. Neck supple.  Cardiovascular: Normal rate and regular rhythm.   Pulmonary/Chest: Effort normal and breath sounds normal.  Abdominal: Soft. Bowel sounds are normal.  Musculoskeletal: Normal range of motion.  Cervical back: She exhibits bony tenderness.       Thoracic back: Normal.       Lumbar back: Normal.  Neurological: She is alert and oriented to person, place, and time. Coordination normal.  Skin: Skin is warm and dry.    ED Course  Procedures (including critical care time)  Labs Reviewed  URINALYSIS, ROUTINE W REFLEX MICROSCOPIC - Abnormal; Notable for the following:    APPearance CLOUDY (*)    Leukocytes, UA MODERATE (*)    All other components within normal limits  URINE CULTURE  GLUCOSE, CAPILLARY  CBC WITH DIFFERENTIAL  COMPREHENSIVE METABOLIC PANEL  TROPONIN I  PREGNANCY, URINE  URINE MICROSCOPIC-ADD ON   No results found.  Date: 11/30/2012  Rate: 109  Rhythm: sinus tachycardia  QRS Axis: normal  Intervals: normal  ST/T Wave abnormalities: normal  Conduction Disutrbances:none  Narrative Interpretation:    Old EKG Reviewed: none available    1. Syncope   2. UTI (lower urinary tract infection)       MDM  Likely vasovagal:pt is ambulatory without any problem:pt is okay to go home will treat for uti       Teressa Lower, NP 11/30/12 1629

## 2012-11-30 NOTE — ED Notes (Signed)
Per Family pt was at church and had syncopal episode, with LOC. Pt was not responding to sternal rub, pt did become alert and arousable with ammonia swab.  Pt states she did eat breakfast this morning. Pt was standing when she fell. Family states pt did hit her head.

## 2012-12-01 NOTE — ED Provider Notes (Signed)
Medical screening examination/treatment/procedure(s) were performed by non-physician practitioner and as supervising physician I was immediately available for consultation/collaboration.   Richardean Canal, MD 12/01/12 [phone]

## 2012-12-02 LAB — URINE CULTURE

## 2013-03-16 ENCOUNTER — Ambulatory Visit: Payer: BC Managed Care – PPO

## 2013-03-16 ENCOUNTER — Ambulatory Visit (INDEPENDENT_AMBULATORY_CARE_PROVIDER_SITE_OTHER): Payer: BC Managed Care – PPO | Admitting: Family Medicine

## 2013-03-16 VITALS — BP 92/68 | HR 77 | Temp 98.7°F | Resp 18 | Wt 140.0 lb

## 2013-03-16 DIAGNOSIS — M25569 Pain in unspecified knee: Secondary | ICD-10-CM

## 2013-03-16 DIAGNOSIS — M25561 Pain in right knee: Secondary | ICD-10-CM

## 2013-03-16 DIAGNOSIS — M2240 Chondromalacia patellae, unspecified knee: Secondary | ICD-10-CM

## 2013-03-16 DIAGNOSIS — M2241 Chondromalacia patellae, right knee: Secondary | ICD-10-CM

## 2013-03-16 MED ORDER — PREDNISONE 20 MG PO TABS: ORAL_TABLET | ORAL | Status: DC

## 2013-03-16 NOTE — Patient Instructions (Signed)
Chondromalacia Your exam shows your knee pain is likely due to a cartilage swelling and irritation under the knee cap called chondromalacia. The knee cap moves up and down in its groove when you walk, run, or squat. It can become irritated from sports or work activities if the knee cap is not lined up perfectly or your quadriceps muscle is relatively weak. This can cause pain, usually around the knee cap but sometimes the back of the knee. It is most common in young and active people. Climbing stairs, prolonged sitting and rising from a chair will often make the pain worse. Treatment includes rest from activities which make it worse. The pain can be reduced with ice packs and anti-inflammatory pain medicine. Exercises to strengthen the thigh (quadriceps) muscle may help prevent further episodes of this condition. Shoe inserts to correct imbalances in the legs or feet may be prescribed by your doctor or a specialist. Support for the knee cap with a light brace may also be helpful. Call your caregiver if you are not improving after 2 - 3 weeks of treatment.  SEEK MEDICAL CARE IF:  You have increasing pain or your knee becomes hot, swollen, red, or begins to give out or lock up on you. Document Released: 10/25/2004 Document Revised: 12/10/2011 Document Reviewed: 03/15/2009 Exit[employer] Patient Information 2014 [employer].  

## 2013-03-16 NOTE — Progress Notes (Signed)
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  She has to get down on the floor with the infants and getting up and down is painful.  Some swelling and pain radiates down back of calf.  Objective:  NAD Right Knee:  Pain with flexion beyond 90 degrees.  Nontender.  No definite effusion.  No ligamentous laxity. Skin: normal [facility] reading (PRIMARY) by  Dr. Milus Glazier  Neg right knee.    Assessment:   Knee pain, right - Plan: DG Knee Complete 4 Views Right, predniSONE (DELTASONE) 20 MG tablet  Signed, Elvina Sidle, MD

## 2013-04-03 ENCOUNTER — Ambulatory Visit (INDEPENDENT_AMBULATORY_CARE_PROVIDER_SITE_OTHER): Payer: BC Managed Care – PPO | Admitting: Family Medicine

## 2013-04-03 VITALS — BP 100/66 | HR 80 | Temp 98.1°F | Resp 16 | Ht 67.0 in | Wt 145.0 lb

## 2013-04-03 DIAGNOSIS — J302 Other seasonal allergic rhinitis: Secondary | ICD-10-CM

## 2013-04-03 DIAGNOSIS — N920 Excessive and frequent menstruation with regular cycle: Secondary | ICD-10-CM

## 2013-04-03 DIAGNOSIS — R05 Cough: Secondary | ICD-10-CM

## 2013-04-03 LAB — POCT CBC
Lymph, poc: 2 (ref 0.6–3.4)
MCH, POC: 28.2 pg (ref 27–31.2)
MCHC: 30.8 g/dL — AB (ref 31.8–35.4)
MCV: 91.6 fL (ref 80–97)
MID (cbc): 0.5 (ref 0–0.9)
POC LYMPH PERCENT: 38.4 %L (ref 10–50)
Platelet Count, POC: 321 10*3/uL (ref 142–424)
RDW, POC: 12.2 %
WBC: 5.3 10*3/uL (ref 4.6–10.2)

## 2013-04-03 MED ORDER — FLUTICASONE PROPIONATE 50 MCG/ACT NA SUSP: 2.0000 | Freq: Every day | NASAL | Status: DC

## 2013-04-03 MED ORDER — AZITHROMYCIN 250 MG PO TABS: ORAL_TABLET | ORAL | Status: DC

## 2013-04-03 NOTE — Patient Instructions (Addendum)
Go back on your nasal spray for nasal allergies, and take claritin or zyrtec daily.  Use the azithromycin as directed.    If your bleeding continues to be a problem please let me know   Let me know if your cough is not better soon!

## 2013-04-03 NOTE — Progress Notes (Signed)
[facility] [address] [place] [address] [phone]  Date:  04/03/2013   Name:  Stacey Key   DOB:  June 03, 1986   MRN:  [id number]  PCP:  No primary provider on file.    Chief Complaint: Cough   History of Present Illness:  Stacey Key is a 27 y.o. very pleasant female patient who presents with the following:  She has noted a cough for about 2 weeks.  It started with a ST, but now she just has a persistently productive cough.  Also notes rhinorrhea off an on.  She has noted chills, but no fever.  She has noted some back aches.   She has had post- tussive emesis- she will spit up a small amount after a coughing spell.    She is generally healthy  She had a negative CXR in February of this year.  She has tried some alka- seltzer plus OTC LMP was about 2 weeks ago She is on OCP, but has had her pill changed a lot due to bleeding over the last few years.    She states she uses contraception for bleeding problems, but not for contraception as she has NEVER had sex.  Therefore, she denies any risk of pregnancy  Patient Active Problem List   Diagnosis Date Noted  . Arthritis 06/25/2012  . Ovarian cyst, complex 05/05/2012  . GERD (gastroesophageal reflux disease) 11/13/2011    Past Medical History  Diagnosis Date  . No pertinent past medical history   . Ovarian cyst   . Allergy   . Arthritis   . Anemia   . Vitamin D deficiency     Past Surgical History  Procedure Laterality Date  . Hernia repair      History  Substance Use Topics  . Smoking status: Never Smoker   . Smokeless tobacco: Never Used  . Alcohol Use: No    Family History  Problem Relation Age of Onset  . Other Neg Hx   . Anemia Mother   . GER disease Mother   . Arthritis Maternal Grandmother   . Hypertension Maternal Grandmother   . Hypertension Maternal Grandfather   . Clotting disorder Maternal Grandfather   . Hypertension Paternal Grandfather     No Known  Allergies  Medication list has been reviewed and updated.  Current Outpatient Prescriptions on File Prior to Visit  Medication Sig Dispense Refill  . norgestimate-ethinyl estradiol (ORTHO-CYCLEN,SPRINTEC,PREVIFEM) 0.25-35 MG-MCG tablet Take 1 tablet by mouth daily.  1 Package  11  . fluticasone (FLONASE) 50 MCG/ACT nasal spray Place 2 sprays into the nose daily.  1 g  1  . lansoprazole (PREVACID) 30 MG capsule Take 1 capsule (30 mg total) by mouth daily.  30 capsule  2  . predniSONE (DELTASONE) 20 MG tablet 2 daily with food  10 tablet  1   No current facility-administered medications on file prior to visit.    Review of Systems:  As per HPI- otherwise negative.   Physical Examination: Filed Vitals:   04/03/13 1226  BP: 100/66  Pulse: 80  Temp: 98.1 F (36.7 C)  Resp: 16   Filed Vitals:   04/03/13 1226  Height: 5\' 7"  (1.702 m)  Weight: 145 lb (65.772 kg)   Body mass index is 22.71 kg/(m^2). Ideal Body Weight: Weight in (lb) to have BMI = 25: 159.3  GEN: WDWN, NAD, Non-toxic, A & O x 3 HEENT: Atraumatic, Normocephalic. Neck supple. No masses, No LAD.  Bilateral TM wnl, oropharynx normal.  PEERL,EOMI.   Nasal cavity shows stringy mucus typical of allergic rhinitis Ears and Nose: No external deformity. CV: RRR, No M/G/R. No JVD. No thrill. No extra heart sounds. PULM: CTA B, no wheezes, crackles, rhonchi. No retractions. No resp. distress. No accessory muscle use. ABD: S, NT, ND EXTR: No c/c/e NEURO Normal gait.  PSYCH: Normally interactive. Conversant. Not depressed or anxious appearing.  Calm demeanor.   Results for orders placed in visit on 04/03/13  POCT CBC      Result Value Range   WBC 5.3  4.6 - 10.2 K/uL   Lymph, poc 2.0  0.6 - 3.4   POC LYMPH PERCENT 38.4  10 - 50 %L   MID (cbc) 0.5  0 - 0.9   POC MID % 10.0  0 - 12 %M   POC Granulocyte 2.7  2 - 6.9   Granulocyte percent 51.6  37 - 80 %G   RBC 4.75  4.04 - 5.48 M/uL   Hemoglobin 13.4  12.2 - 16.2 g/dL    HCT, POC 16.1  09.6 - 47.9 %   MCV 91.6  80 - 97 fL   MCH, POC 28.2  27 - 31.2 pg   MCHC 30.8 (*) 31.8 - 35.4 g/dL   RDW, POC 04.5     Platelet Count, POC 321  142 - 424 K/uL   MPV 9.1  0 - 99.8 fL    Assessment and Plan: Cough - Plan: POCT CBC, azithromycin (ZITHROMAX) 250 MG tablet  Menorrhagia - Plan: POCT CBC  Seasonal allergies - Plan: fluticasone (FLONASE) 50 MCG/ACT nasal spray  No anemia.  She will continue her current OCP, and will follow- up with [place] as necessary if her menorrhagia continues.    Signed Abbe Amsterdam, MD

## 2013-04-11 ENCOUNTER — Other Ambulatory Visit: Payer: Self-pay | Admitting: Emergency Medicine

## 2013-06-19 ENCOUNTER — Other Ambulatory Visit: Payer: Self-pay | Admitting: Obstetrics and Gynecology

## 2013-07-27 ENCOUNTER — Ambulatory Visit (INDEPENDENT_AMBULATORY_CARE_PROVIDER_SITE_OTHER): Payer: BC Managed Care – PPO | Admitting: Family Medicine

## 2013-07-27 ENCOUNTER — Ambulatory Visit: Payer: BC Managed Care – PPO

## 2013-07-27 VITALS — BP 118/70 | HR 84 | Temp 98.0°F | Resp 16 | Ht 67.0 in | Wt 145.0 lb

## 2013-07-27 DIAGNOSIS — R05 Cough: Secondary | ICD-10-CM

## 2013-07-27 DIAGNOSIS — R079 Chest pain, unspecified: Secondary | ICD-10-CM

## 2013-07-27 DIAGNOSIS — R059 Cough, unspecified: Secondary | ICD-10-CM

## 2013-07-27 LAB — POCT CBC
Granulocyte percent: 40.5 %G (ref 37–80)
HCT, POC: 42 % (ref 37.7–47.9)
Hemoglobin: 12.8 g/dL (ref 12.2–16.2)
Lymph, poc: 1.7 (ref 0.6–3.4)
MCH, POC: 28.2 pg (ref 27–31.2)
MCHC: 30.5 g/dL — AB (ref 31.8–35.4)
MCV: 92.5 fL (ref 80–97)
MID (cbc): 0.3 (ref 0–0.9)
MPV: 9 fL (ref 0–99.8)
POC Granulocyte: 1.4 — AB (ref 2–6.9)
POC LYMPH PERCENT: 49.7 %L (ref 10–50)
POC MID %: 9.8 %M (ref 0–12)
Platelet Count, POC: 260 10*3/uL (ref 142–424)
RBC: 4.54 M/uL (ref 4.04–5.48)
RDW, POC: 12.7 %
WBC: 3.5 10*3/uL — AB (ref 4.6–10.2)

## 2013-07-27 LAB — TSH: TSH: 1.312 u[IU]/mL (ref 0.350–4.500)

## 2013-07-27 MED ORDER — PREDNISONE 20 MG PO TABS: 40.0000 mg | ORAL_TABLET | Freq: Every day | ORAL | Status: DC

## 2013-07-27 MED ORDER — AZITHROMYCIN 250 MG PO TABS: ORAL_TABLET | ORAL | Status: DC

## 2013-07-27 NOTE — Progress Notes (Signed)
  Subjective:    Patient ID: Stacey Key, female    DOB: 1986/06/08, 27 y.o.   MRN: [id number]  HPI Patient presents with complaints of chest pain for three weeks. For the past couple days has had cough which has increased her pain. She works in a daycare. She complains of pain with cough, the cough is slightly productive, has had some nasal drainage also. She does note she has some seasonal allergies. She denies any travel. She denies any fevers. She is also complaining of aching of her joints as well. She states when she lies down the pain gets worse. 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 Patient denies any wheezing. Patient does indicate she feels cold chills at times. She has no family history of lung disease or sarcoid   Review of Systems     Objective:   Physical Exam  Healthy appearing 27 year old female. Well developed well nourished in no distress. Seated comfortably on the exam table, walks easily from chair to table.  Chest xray and CBC ordered today.   [facility] reading (PRIMARY) by  Dr. Milus Glazier: increased interstitial markings bilaterally. Results for orders placed in visit on 07/27/13  POCT CBC      Result Value Range   WBC 3.5 (*) 4.6 - 10.2 K/uL   Lymph, poc 1.7  0.6 - 3.4   POC LYMPH PERCENT 49.7  10 - 50 %L   MID (cbc) 0.3  0 - 0.9   POC MID % 9.8  0 - 12 %M   POC Granulocyte 1.4 (*) 2 - 6.9   Granulocyte percent 40.5  37 - 80 %G   RBC 4.54  4.04 - 5.48 M/uL   Hemoglobin 12.8  12.2 - 16.2 g/dL   HCT, POC 21.3  08.6 - 47.9 %   MCV 92.5  80 - 97 fL   MCH, POC 28.2  27 - 31.2 pg   MCHC 30.5 (*) 31.8 - 35.4 g/dL   RDW, POC 57.8     Platelet Count, POC 260  142 - 424 K/uL   MPV 9.0  0 - 99.8 fL   .      Assessment & Plan:

## 2013-09-27 ENCOUNTER — Ambulatory Visit: Payer: BC Managed Care – PPO

## 2013-09-27 ENCOUNTER — Ambulatory Visit (INDEPENDENT_AMBULATORY_CARE_PROVIDER_SITE_OTHER): Payer: BC Managed Care – PPO | Admitting: Internal Medicine

## 2013-09-27 VITALS — BP 112/78 | HR 87 | Temp 99.0°F | Resp 17 | Wt 142.0 lb

## 2013-09-27 DIAGNOSIS — R05 Cough: Secondary | ICD-10-CM

## 2013-09-27 DIAGNOSIS — R079 Chest pain, unspecified: Secondary | ICD-10-CM

## 2013-09-27 DIAGNOSIS — J111 Influenza due to unidentified influenza virus with other respiratory manifestations: Secondary | ICD-10-CM

## 2013-09-27 LAB — POCT CBC
Hemoglobin: 13.4 g/dL (ref 12.2–16.2)
MCH, POC: 28.2 pg (ref 27–31.2)
MCV: 91.9 fL (ref 80–97)
MID (cbc): 0.4 (ref 0–0.9)
Platelet Count, POC: 264 10*3/uL (ref 142–424)
RBC: 4.75 M/uL (ref 4.04–5.48)
WBC: 5.5 10*3/uL (ref 4.6–10.2)

## 2013-09-27 IMAGING — CT CT ABD-PELV W/ CM
1 of 2 series · 15 of 32 positions shown, 19 images · IV contrast (OMNIPAQUE 300)
Comparison: None.

CLINICAL DATA: Right lower quadrant pain.

CT ABDOMEN AND PELVIS WITH CONTRAST
TECHNIQUE: Multidetector CT imaging of the abdomen and pelvis was
performed following the standard protocol during bolus
administration of intravenous contrast.
Contrast: 80mL OMNIPAQUE IOHEXOL 300 MG/ML  SOLN

[Series 2: abd/pel with · axial · 0.62mm/px · z∈[+1186,+1571]mm · 15 of 85 slices shown, 19 images]
[im 4/85  soft-tissue]
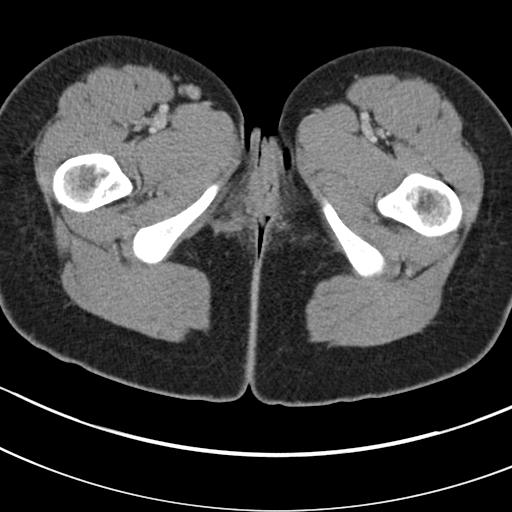
[im 4/85  bone]
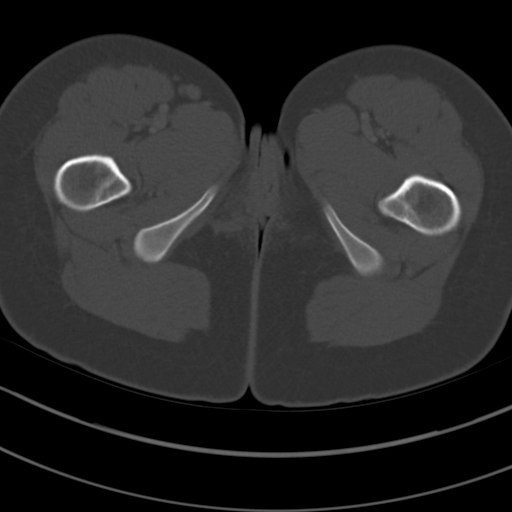
[im 10/85  soft-tissue]
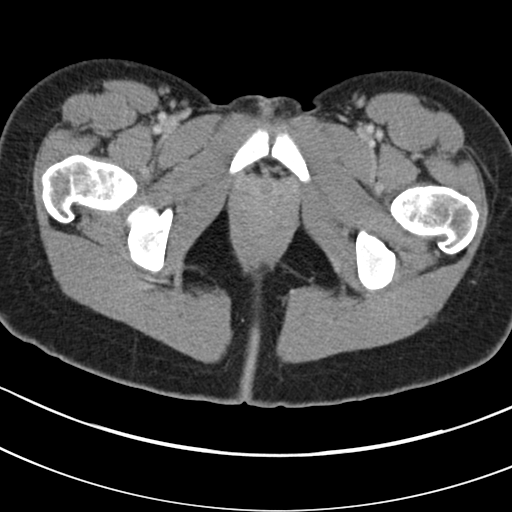
[im 17/85  soft-tissue]
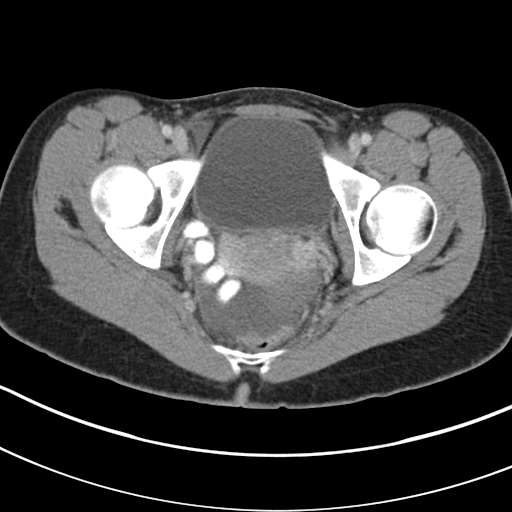
[im 23/85  soft-tissue]
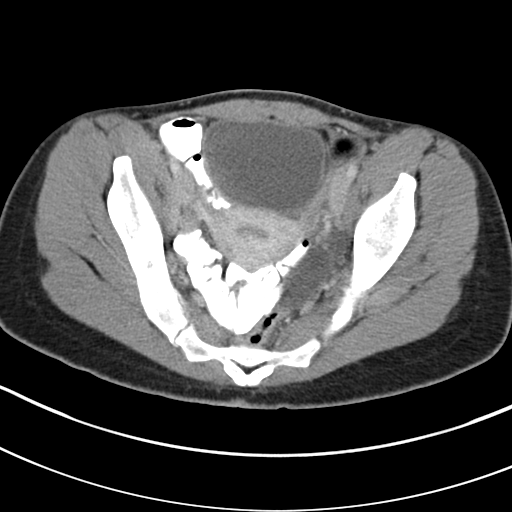
[im 30/85  soft-tissue]
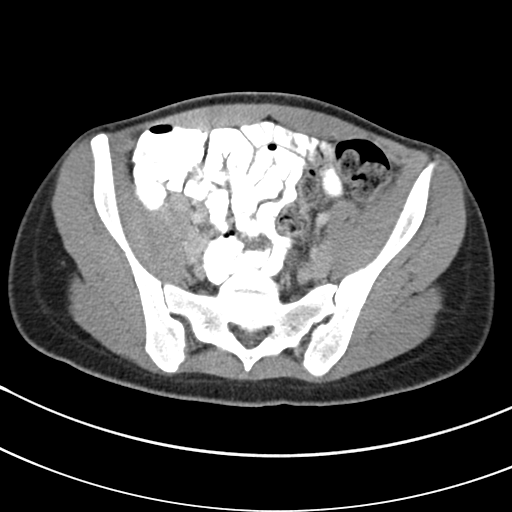
[im 36/85  soft-tissue]
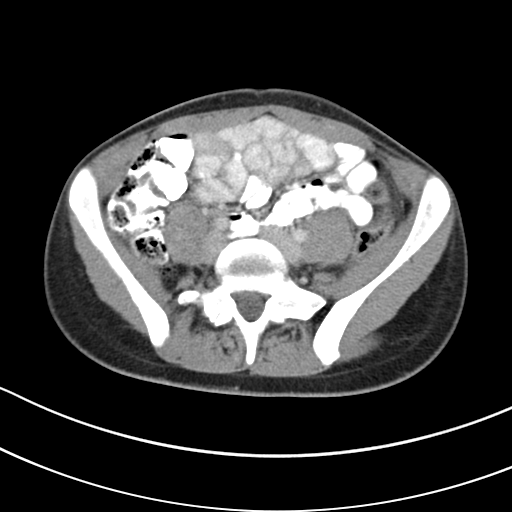
[im 43/85  soft-tissue]
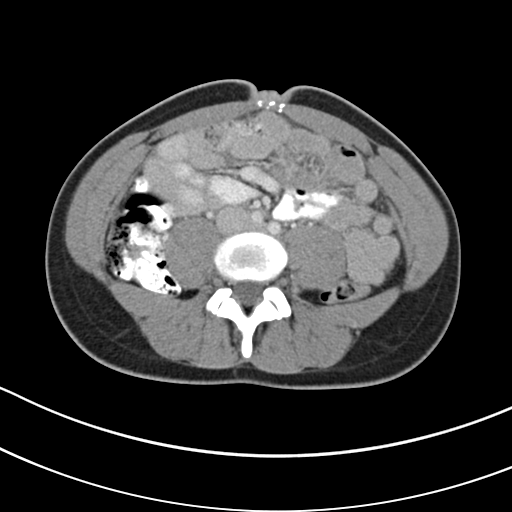
[im 49/85  soft-tissue]
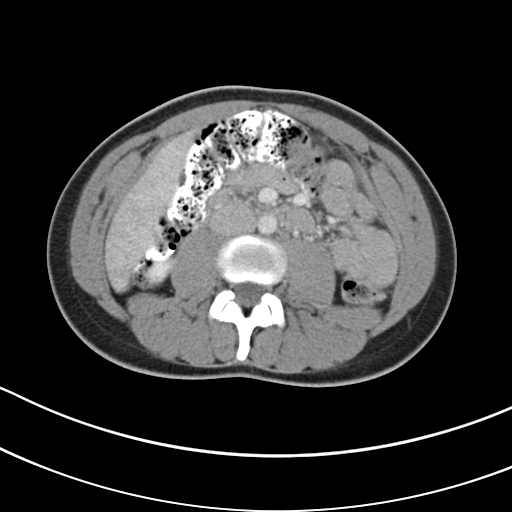
[im 55/85  soft-tissue]
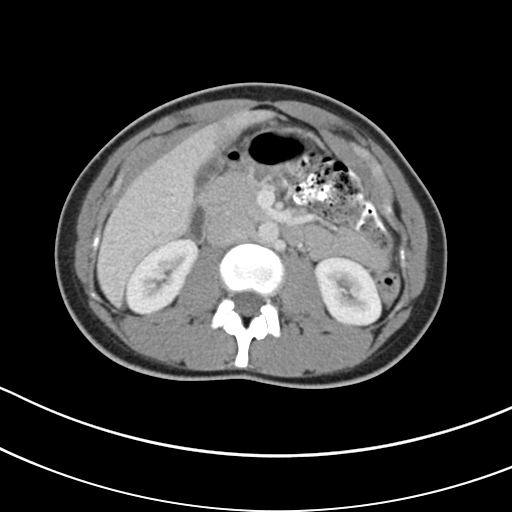
[im 55/85  bone]
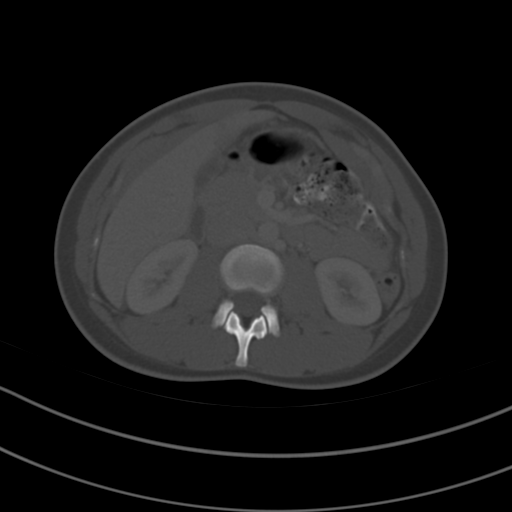
[im 62/85  soft-tissue]
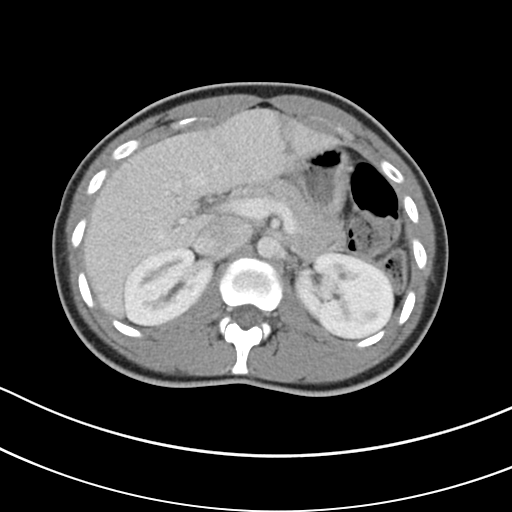
[im 68/85  soft-tissue]
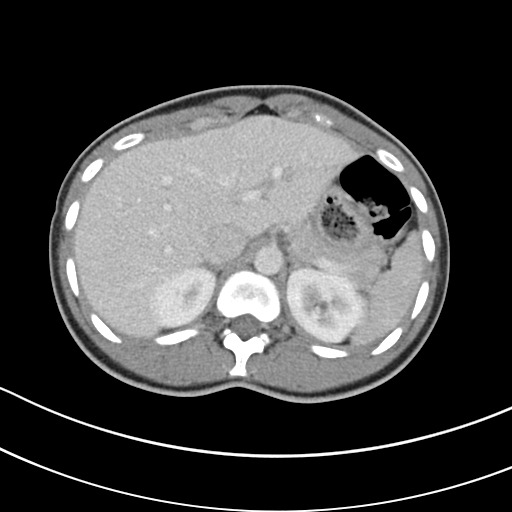
[im 72/85  lung]
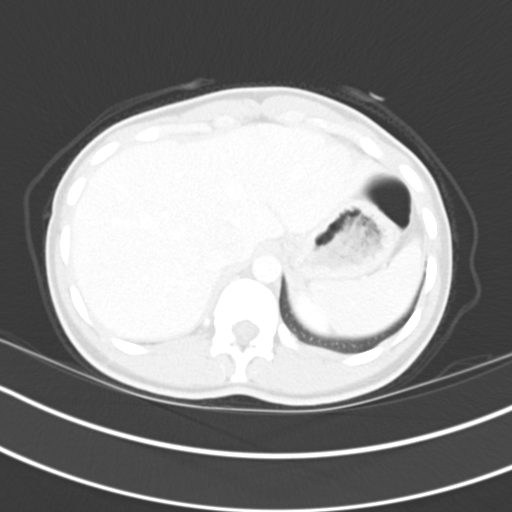
[im 75/85  soft-tissue]
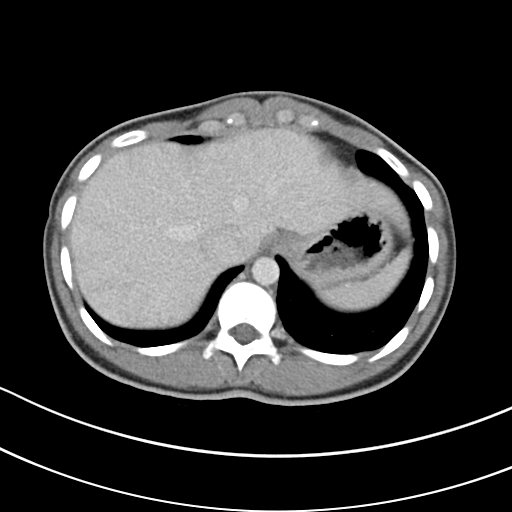
[im 75/85  lung]
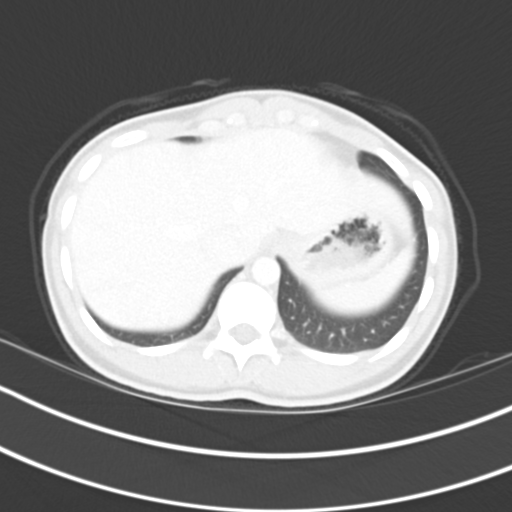
[im 78/85  lung]
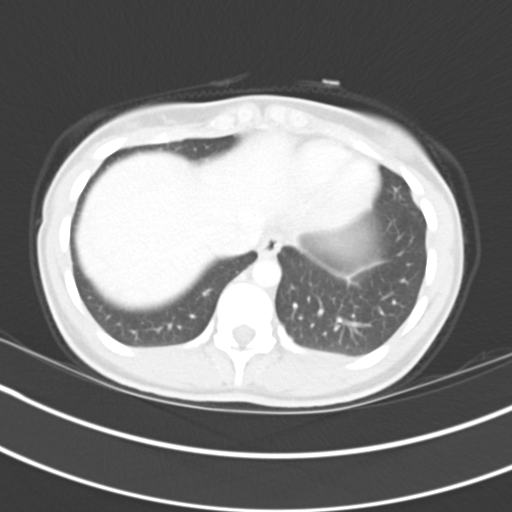
[im 81/85  soft-tissue]
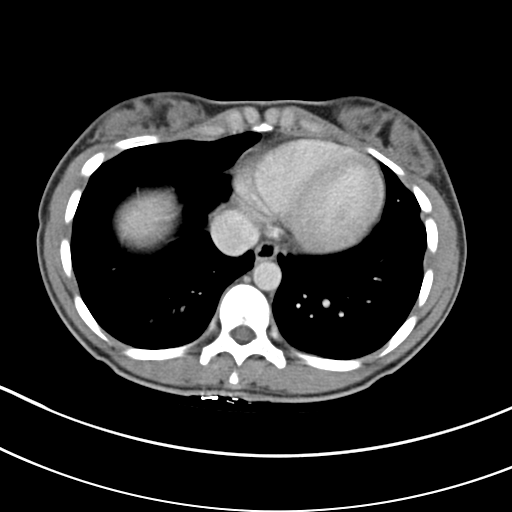
[im 81/85  lung]
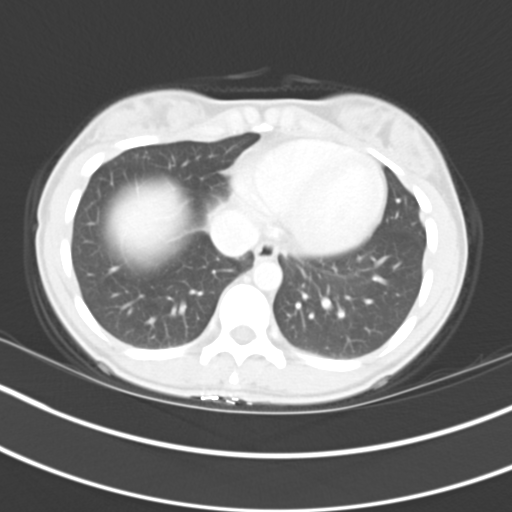

[15 of 32 positions shown; findings below may reference images not displayed]

FINDINGS: Lung bases are clear.  No effusions.  Heart is normal
size.

Liver, gallbladder, spleen, pancreas, adrenals and kidneys are
normal.

Moderate stool throughout the colon.  Urinary bladder and uterus
are unremarkable.  Moderate free fluid in the pelvis.  Dilated
tubular structure in the left adnexa measuring 5.4 x 3.0 cm,
suspect hydrosalpinx.  No right adnexal mass.

Appendix not definitively seen.  No inflammatory process or
secondary signs of appendicitis.  Small bowel is decompressed.  No
free air or adenopathy.  Aorta is normal caliber.  Incidentally
noted is a circumaortic left renal vein.

No acute bony abnormality.
IMPRESSION: Dilated tubular structure in the left adnexa, likely hydrosalpinx.
Moderate free fluid in the pelvis.

Appendix not definitively visualized.  No secondary signs of
appendicitis.

Moderate stool burden in the colon.

## 2013-09-27 MED ORDER — HYDROCODONE-HOMATROPINE 5-1.5 MG/5ML PO SYRP: 5.0000 mL | ORAL_SOLUTION | Freq: Four times a day (QID) | ORAL | Status: DC | PRN

## 2013-09-27 NOTE — Progress Notes (Addendum)
Subjective:    Patient ID: Stacey Key, female    DOB: Mar 02, 1986, 27 y.o.   MRN: [id number]  HPI This chart was scribed for [facility] by Smiley Houseman, Scribe. This patient was seen in room 10 and the patient's care was started at 12:10 PM.  HPI Comments: Stacey Key is a 27 y.o. female who [facility] complaining of constant moderate chest pain that started yesterday and she sneezed very hard. She states pain does radiate between her shoulder blades and into her lower back.  She has associated productive cough, nausea, rhinorrhea, over the past 5 days.  She denies emesis and diarrhea.  She states she does not know if she had a fever at home, temperature now is 77F.  chills last night. Pain with deep breathing today. Mild shortness of breath. No history of asthma    Past Medical History  Diagnosis Date  . No pertinent past medical history   . Ovarian cyst   . Allergy   . Arthritis   . Anemia   . Vitamin D deficiency     Past Surgical History  Procedure Laterality Date  . Hernia repair      Family History  Problem Relation Age of Onset  . Other Neg Hx   . Anemia Mother   . GER disease Mother   . Arthritis Maternal Grandmother   . Hypertension Maternal Grandmother   . Hypertension Maternal Grandfather   . Clotting disorder Maternal Grandfather   . Hypertension Paternal Grandfather     History   Social History  . Marital Status: Single    Spouse Name: N/A    Number of Children: N/A  . Years of Education: N/A   Occupational History  . Not on file.   Social History Main Topics  . Smoking status: Never Smoker   . Smokeless tobacco: Never Used  . Alcohol Use: No  . Drug Use: No  . Sexual Activity: No   Other Topics Concern  . Not on file   Social History Narrative  . No narrative on file    No Known Allergies  Patient Active Problem List   Diagnosis Date Noted  . Arthritis 06/25/2012  . Ovarian  cyst, complex 05/05/2012  . GERD (gastroesophageal reflux disease) 11/13/2011          Review of Systems  Constitutional: Positive for chills. Negative for fever.  HENT: Positive for congestion (nasal) and rhinorrhea.   Respiratory: Negative for cough and shortness of breath.   Cardiovascular: Negative for chest pain.  Gastrointestinal: Positive for nausea. Negative for vomiting, abdominal pain and diarrhea.  Musculoskeletal: Positive for back pain (lower back pain).  Skin: Negative for color change and rash.  All other systems reviewed and are negative.       Objective:   Physical Exam  Nursing note and vitals reviewed. Constitutional: She is oriented to person, place, and time. She appears well-developed and well-nourished.  Appears mildly uncomfortable but not breathless  HENT:  Head: Normocephalic and atraumatic.  Right Ear: External ear normal.  Left Ear: External ear normal.  Nose: Nose normal.  Mouth/Throat: Oropharynx is clear and moist. No oropharyngeal exudate.  Eyes: Conjunctivae and EOM are normal. Right eye exhibits no discharge. Left eye exhibits no discharge.  Neck: Normal range of motion.  Cardiovascular: Normal rate, regular rhythm and normal heart sounds.   No murmur heard. Pulmonary/Chest: Effort normal and breath sounds normal. No respiratory distress. She  has no wheezes. She has no rales.  She is tender to palpation along the costosternal junctions 34 and 5 bilaterally as well as along the parascapular borders bilaterally and  Abdominal: There is no tenderness.  Musculoskeletal: Normal range of motion.  Lymphadenopathy:    She has no cervical adenopathy.  Neurological: She is alert and oriented to person, place, and time.  Skin: Skin is warm and dry.  Psychiatric: She has a normal mood and affect. Her behavior is normal.   DIAGNOSTIC STUDIES: Oxygen Saturation is 96% on RA, normal by my interpretation.    COORDINATION OF CARE: 12:15 PM-Patient  informed of current plan of treatment and evaluation and agrees with plan.     Triage Vitals: BP 112/78  Pulse 87  Temp(Src) 99 F (37.2 C) (Oral)  Resp 17  Wt 142 lb (64.411 kg)  SpO2 96%  LMP 09/13/2013  [facility] reading (PRIMARY) by  Dr.Doolittle= no active infiltrate/no rib fracture seen/no pneumothorax.  Results for orders placed in visit on 09/27/13  POCT CBC      Result Value Range   WBC 5.5  4.6 - 10.2 K/uL   Lymph, poc 2.1  0.6 - 3.4   POC LYMPH PERCENT 37.8  10 - 50 %L   MID (cbc) 0.4  0 - 0.9   POC MID % 6.7  0 - 12 %M   POC Granulocyte 3.1  2 - 6.9   Granulocyte percent 55.5  37 - 80 %G   RBC 4.75  4.04 - 5.48 M/uL   Hemoglobin 13.4  12.2 - 16.2 g/dL   HCT, POC 14.7  82.9 - 47.9 %   MCV 91.9  80 - 97 fL   MCH, POC 28.2  27 - 31.2 pg   MCHC 30.7 (*) 31.8 - 35.4 g/dL   RDW, POC 56.2     Platelet Count, POC 264  142 - 424 K/uL   MPV 9.1  0 - 99.8 fL       Assessment & Plan:  I have completed the patient encounter in its entirety as documented by the scribe, with editing by me where necessary. Robert P. Merla Riches, M.D. Cough secondary to influenza chest wall pain secondary to pulled muscle  Hydrocodone cough syrup Tylenol Fluids Work release

## 2013-10-14 ENCOUNTER — Ambulatory Visit: Payer: BC Managed Care – PPO

## 2013-10-14 ENCOUNTER — Encounter: Payer: Self-pay | Admitting: Family Medicine

## 2013-10-14 ENCOUNTER — Ambulatory Visit (INDEPENDENT_AMBULATORY_CARE_PROVIDER_SITE_OTHER): Payer: BC Managed Care – PPO | Admitting: Family Medicine

## 2013-10-14 VITALS — BP 110/68 | HR 76 | Temp 99.0°F | Resp 16 | Ht 67.5 in | Wt 141.0 lb

## 2013-10-14 DIAGNOSIS — Z309 Encounter for contraceptive management, unspecified: Secondary | ICD-10-CM

## 2013-10-14 DIAGNOSIS — R079 Chest pain, unspecified: Secondary | ICD-10-CM

## 2013-10-14 DIAGNOSIS — IMO0001 Reserved for inherently not codable concepts without codable children: Secondary | ICD-10-CM

## 2013-10-14 DIAGNOSIS — R091 Pleurisy: Secondary | ICD-10-CM

## 2013-10-14 DIAGNOSIS — M940 Chondrocostal junction syndrome [Tietze]: Secondary | ICD-10-CM

## 2013-10-14 LAB — POCT URINALYSIS DIPSTICK
Bilirubin, UA: NEGATIVE
Glucose, UA: NEGATIVE
Leukocytes, UA: NEGATIVE
Nitrite, UA: NEGATIVE
PH UA: 6
PROTEIN UA: NEGATIVE
Urobilinogen, UA: 1

## 2013-10-14 LAB — POCT CBC
Granulocyte percent: 40.8 % (ref 37–80)
HCT, POC: 41 % (ref 37.7–47.9)
Hemoglobin: 12.6 g/dL (ref 12.2–16.2)
Lymph, poc: 2.8 (ref 0.6–3.4)
MCH, POC: 28 pg (ref 27–31.2)
MCHC: 30.7 g/dL — AB (ref 31.8–35.4)
MCV: 91 fL (ref 80–97)
MID (cbc): 0.5 (ref 0–0.9)
MPV: 9.5 fL (ref 0–99.8)
POC Granulocyte: 2.2 (ref 2–6.9)
POC LYMPH PERCENT: 50.3 % — AB (ref 10–50)
POC MID %: 8.9 % (ref 0–12)
Platelet Count, POC: 273 K/uL (ref 142–424)
RBC: 4.5 M/uL (ref 4.04–5.48)
RDW, POC: 12.5 %
WBC: 5.5 K/uL (ref 4.6–10.2)

## 2013-10-14 LAB — POCT UA - MICROSCOPIC ONLY
Bacteria, U Microscopic: NEGATIVE
Casts, Ur, LPF, POC: NEGATIVE
Crystals, Ur, HPF, POC: NEGATIVE
Yeast, UA: NEGATIVE

## 2013-10-14 LAB — POCT URINE PREGNANCY: PREG TEST UR: NEGATIVE

## 2013-10-14 MED ORDER — NAPROXEN 500 MG PO TABS: 500.0000 mg | ORAL_TABLET | Freq: Two times a day (BID) | ORAL | Status: DC

## 2013-10-14 MED ORDER — MEDROXYPROGESTERONE ACETATE 150 MG/ML IM SUSP
150.0000 mg | Freq: Once | INTRAMUSCULAR | Status: AC
  Administered 2013-10-14: 150 mg via INTRAMUSCULAR

## 2013-10-14 MED ORDER — HYDROCODONE-ACETAMINOPHEN 5-325 MG PO TABS
1.0000 | ORAL_TABLET | Freq: Four times a day (QID) | ORAL | Status: DC | PRN
Start: 2013-10-14 — End: 2013-12-10

## 2013-10-14 MED ORDER — PREDNISONE 10 MG PO TABS: ORAL_TABLET | ORAL | Status: DC

## 2013-10-14 NOTE — Progress Notes (Addendum)
This chart was scribed for Delman Cheadle, MD by Einar Pheasant, ED Scribe. This patient was seen in room 2 and the patient's care was started at 9:08 PM. Subjective:    Patient ID: Stacey Key, female    DOB: [birth year], 28 y.o.   MRN: [id number]  Chief Complaint  Patient presents with  . Chest Pain    since  11:00 am today last time this occured pt had the flu    HPI HPI Comments: Stacey Key is a 28 y.o. female who presents to [facility] complaining of Chest pain that started 9 hours ago. Pt states that her current symptom is similar to a pain she experienced when she had the flu. She states that the pain started very sudden onset - really struck her in the center of her chest and now radiates up to her shoulder, down her left arm occ, and is now radiating through to her back. Pt states that she was sitting at the computer typing when she had a sudden onset of chest pain started. She quickly grabbed her chest and bent forward due to the severity of the pain and noticed that her heart was racing. She states that the pain is her chest is exacerbated by deep breathing. Pt was seen 2 weeks ago and again 2 months ago with similar pain. The chest pain from her previous visits were associated to costochondritis and URI type symptoms. She was treated with initially treated w/ Prednisone and azithro and then put on hycodan and tylenol at last visit.    She is also complaining of upper thigh pain that radiates down her legs. She states that the pain started 1 week ago. Pt reports coming to [facility] for a pinched nerve, so she thought this episode was due to her nerve. However, the pain never subsided.   She states that she takes Clinical biochemist for birth control. She just started bleeding when she started the placebo wk on her pills but now her periods hasn't stopped even though she is on a new pack - has been on her cycle for almost 2 wks now.  She started birth  control about 6 mos ago and has been on 4-5 different medications in that time. She had irregular bleeding, more cramping, nausea, headaches - a wide variety of reactions to OCPs. She has been seeing Dr. Leward Quan for this and they discussed that if she didn't tolerate sprintec she may want to switch to Depo-Provera which pt would like to consider.  Had some borderline anemia prior so trying to do high iron diet.   Pt states that recently she always feels cold, and has had to dress in layer at home. 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 Pt denies any recent travels.  No h/o any sexual activity prior.  Birth control started just to control menses and dysmenorhea.    Past Medical History  Diagnosis Date  . No pertinent past medical history   . Ovarian cyst   . Allergy   . Arthritis   . Anemia   . Vitamin D deficiency    No Known Allergies Current Outpatient Prescriptions on File Prior to Visit  Medication Sig Dispense Refill  . fluticasone (FLONASE) 50 MCG/ACT nasal spray Place 2 sprays into the nose daily.  1 g  11  . lansoprazole (PREVACID) 30 MG capsule TAKE ONE CAPSULE BY MOUTH EVERY DAY  30 capsule  2  .  SPRINTEC 28 0.25-35 MG-MCG tablet TAKE 1 TABLET BY MOUTH DAILY.  28 tablet  9  . HYDROcodone-homatropine (HYCODAN) 5-1.5 MG/5ML syrup Take 5 mLs by mouth every 6 (six) hours as needed for cough.  120 mL  0   No current facility-administered medications on file prior to visit.    Review of Systems  Constitutional: Negative for fever, chills, diaphoresis and appetite change.  HENT: Positive for congestion, rhinorrhea and sinus pressure.   Respiratory: Positive for cough, chest tightness and shortness of breath.   Cardiovascular: Positive for chest pain and palpitations. Negative for leg swelling.  Gastrointestinal: Negative for nausea, vomiting and diarrhea.  Genitourinary: Positive for vaginal bleeding and menstrual problem. Negative for dysuria,  vaginal discharge and difficulty urinating.  Musculoskeletal: Positive for arthralgias, back pain and myalgias. Negative for gait problem and joint swelling.  Skin: Negative for rash.  Neurological: Negative for weakness and numbness.  Hematological: Negative for adenopathy. Does not bruise/bleed easily.      Triage vitals: BP 110/68  Pulse 76  Temp(Src) 99 F (37.2 C) (Oral)  Resp 16  Ht 5' 7.5" (1.715 m)  Wt 141 lb (63.957 kg)  BMI 21.75 kg/m2  SpO2 100%  LMP 10/03/2013 Objective:   Physical Exam  Nursing note and vitals reviewed. Constitutional: She appears well-developed and well-nourished. No distress.  HENT:  Head: Normocephalic and atraumatic.  Nose: Mucosal edema and rhinorrhea present.  Mouth/Throat: Oropharynx is clear and moist. No oropharyngeal exudate, posterior oropharyngeal edema or posterior oropharyngeal erythema.  Eyes: Conjunctivae are normal. Right eye exhibits no discharge. Left eye exhibits no discharge.  Neck: Neck supple. No mass and no thyromegaly present.  Cardiovascular: Normal rate, regular rhythm, S1 normal, S2 normal and normal heart sounds.  Exam reveals no gallop and no friction rub.   No murmur heard. Pulmonary/Chest: Effort normal and breath sounds normal. No respiratory distress. She exhibits tenderness.  Left Costal-sternal joint tenderness especially over 4th rib.  Abdominal: Soft. She exhibits no distension. There is no tenderness.  Musculoskeletal: She exhibits no edema and no tenderness.  Legs measured: 7 cm distal to distal patellar edge with right leg at 38.5 and Left leg at 38.5  Lymphadenopathy:       Head (right side): No submandibular, no tonsillar, no preauricular and no posterior auricular adenopathy present.       Head (left side): No submandibular, no tonsillar, no preauricular and no posterior auricular adenopathy present.    She has cervical adenopathy.       Right cervical: Superficial cervical adenopathy present.       Left  cervical: Superficial cervical adenopathy present.       Right: No supraclavicular adenopathy present.       Left: No supraclavicular adenopathy present.  Neurological: She is alert.  Skin: Skin is warm and dry.  Psychiatric: She has a normal mood and affect. Her behavior is normal. Thought content normal.      Results for orders placed in visit on 10/14/13  POCT CBC      Result Value Range   WBC 5.5  4.6 - 10.2 K/uL   Lymph, poc 2.8  0.6 - 3.4   POC LYMPH PERCENT 50.3 (*) 10 - 50 %L   MID (cbc) 0.5  0 - 0.9   POC MID % 8.9  0 - 12 %M   POC Granulocyte 2.2  2 - 6.9   Granulocyte percent 40.8  37 - 80 %G   RBC 4.50  4.04 - 5.48  M/uL   Hemoglobin 12.6  12.2 - 16.2 g/dL   HCT, POC 41.0  37.7 - 47.9 %   MCV 91.0  80 - 97 fL   MCH, POC 28.0  27 - 31.2 pg   MCHC 30.7 (*) 31.8 - 35.4 g/dL   RDW, POC 12.5     Platelet Count, POC 273  142 - 424 K/uL   MPV 9.5  0 - 99.8 fL  POCT UA - MICROSCOPIC ONLY      Result Value Range   WBC, Ur, HPF, POC 1-5     RBC, urine, microscopic 2-4     Bacteria, U Microscopic neg     Mucus, UA small     Epithelial cells, urine per micros 0-2     Crystals, Ur, HPF, POC neg     Casts, Ur, LPF, POC neg     Yeast, UA neg    POCT URINALYSIS DIPSTICK      Result Value Range   Color, UA yellow     Clarity, UA clear     Glucose, UA neg     Bilirubin, UA neg     Ketones, UA trace     Spec Grav, UA >=1.030     Blood, UA mod     pH, UA 6.0     Protein, UA neg     Urobilinogen, UA 1.0     Nitrite, UA neg     Leukocytes, UA Negative    POCT URINE PREGNANCY      Result Value Range   Preg Test, Ur Negative     [facility] reading (PRIMARY) by  Dr. Brigitte Pulse. CXR: Normal EKG: NSR, no ischemic changes, completely unchanged from prior EKG of 12/11/12.  Assessment & Plan:  Chest pain - Plan: EKG 12-Lead, DG Chest 2 View, POCT CBC, Sedimentation Rate, POCT UA - Microscopic Only, POCT urinalysis dipstick, POCT urine pregnancy  Pleurisy - susect pleurisy as cause of  sxs, start naproxen tonight w/ prn hydrocodone. Then tomorrow transition to prednisone taper - can resume naprosyn when done.  Reviewed w/ pt that main concern in ddx if PE as she has been on so many different types of birth control over the past sev mos. However, eval for this is completely neg - absolutely no swelling in legs, EKG completley unchanged from prior, VS nml so will hold off on CTA and try treatment for pleurisy but pt counselled that if no sig improvement in 24 hrs of prednisone or if worsening at all, RTC for CTA arrangement or to ER.  Costochondritis  Family planning/mennorrhagia - has failed 4-5 different OCPs due to side effects or continued irreg bleeding. Will switch to Depo-Provera alternatively - gave inj today due to neg UPT and no h/o sexual activity. If sxs cont, rec f/u w/ gyn to cons further w/u needed and poss IUD.  Meds ordered this encounter  Medications  . predniSONE (DELTASONE) 10 MG tablet    Sig: 6 tabs po qam d1, 5 tabs po d2, 4 tabs po d3, 3 tab po d4, 2 tab po d5, 1 tab po d6    Dispense:  21 tablet    Refill:  0  . naproxen (NAPROSYN) 500 MG tablet    Sig: Take 1 tablet (500 mg total) by mouth 2 (two) times daily with a meal.    Dispense:  30 tablet    Refill:  0    Do not take while on prednisone.  Marland Kitchen HYDROcodone-acetaminophen (NORCO/VICODIN) 5-325 MG  per tablet    Sig: Take 1 tablet by mouth every 6 (six) hours as needed for moderate pain.    Dispense:  10 tablet    Refill:  0    I personally performed the services described in this documentation, which was scribed in my presence. The recorded information has been reviewed and considered, and addended by me as needed.  Delman Cheadle, MD MPH

## 2013-10-14 NOTE — Patient Instructions (Signed)
Pleurisy Pleurisy is an inflammation and swelling of the lining of the lungs (pleura). Because of this inflammation, it hurts to breathe. It can be aggravated by coughing, laughing, or deep breathing. Pleurisy is often caused by an underlying infection or disease.  HOME CARE INSTRUCTIONS  Monitor your pleurisy for any changes. The following actions may help to alleviate any discomfort you are experiencing:  Medicine may help with pain. Only take over-the-counter or prescription medicines for pain, discomfort, or fever as directed by your health care provider.  Only take antibiotic medicine as directed. Make sure to finish it even if you start to feel better. SEEK MEDICAL CARE IF:   Your pain is not controlled with medicine or is increasing.  You have an increase in pus-like (purulent) secretions brought up with coughing. SEEK IMMEDIATE MEDICAL CARE IF:   You have blue or dark lips, fingernails, or toenails.  You are coughing up blood.  You have increased difficulty breathing.  You have continuing pain unrelieved by medicine or pain lasting more than 1 week.  You have pain that radiates into your neck, arms, or jaw.  You develop increased shortness of breath or wheezing.  You develop a fever, rash, vomiting, fainting, or other serious symptoms. MAKE SURE YOU:  Understand these instructions.   Will watch your condition.   Will get help right away if you are not doing well or get worse.  Document Released: 09/17/2005 Document Revised: 05/20/2013 Document Reviewed: 03/01/2013 [facility] Patient Information 2014 [place]. Costochondritis Costochondritis, sometimes called Tietze syndrome, is a swelling and irritation (inflammation) of the tissue (cartilage) that connects your ribs with your breastbone (sternum). It causes pain in the chest and rib area. Costochondritis usually goes away on its own over time. It can take up to 6 weeks or longer to get better, especially if you  are unable to limit your activities. CAUSES  Some cases of costochondritis have no known cause. Possible causes include:  Injury (trauma).  Exercise or activity such as lifting.  Severe coughing. SIGNS AND SYMPTOMS  Pain and tenderness in the chest and rib area.  Pain that gets worse when coughing or taking deep breaths.  Pain that gets worse with specific movements. DIAGNOSIS  Your health care provider will do a physical exam and ask about your symptoms. Chest X-rays or other tests may be done to rule out other problems. TREATMENT  Costochondritis usually goes away on its own over time. Your health care provider may prescribe medicine to help relieve pain. HOME CARE INSTRUCTIONS   Avoid exhausting physical activity. Try not to strain your ribs during normal activity. This would include any activities using chest, abdominal, and side muscles, especially if heavy weights are used.  Apply ice to the affected area for the first 2 days after the pain begins.  Put ice in a plastic bag.  Place a towel between your skin and the bag.  Leave the ice on for 20 minutes, 2 3 times a day.  Only take over-the-counter or prescription medicines as directed by your health care provider. SEEK MEDICAL CARE IF:  You have redness or swelling at the rib joints. These are signs of infection.  Your pain does not go away despite rest or medicine. SEEK IMMEDIATE MEDICAL CARE IF:   Your pain increases or you are very uncomfortable.  You have shortness of breath or difficulty breathing.  You cough up blood.  You have worse chest pains, sweating, or vomiting.  You have a fever or  persistent symptoms for more than 2 3 days.  You have a fever and your symptoms suddenly get worse. MAKE SURE YOU:   Understand these instructions.  Will watch your condition.  Will get help right away if you are not doing well or get worse. Document Released: 06/27/2005 Document Revised: 07/08/2013 Document  Reviewed: 04/21/2013 [facility] Patient Information 2014 [place].

## 2013-10-15 LAB — SEDIMENTATION RATE: Sed Rate: 4 mm/hr (ref 0–22)

## 2013-12-10 ENCOUNTER — Ambulatory Visit (INDEPENDENT_AMBULATORY_CARE_PROVIDER_SITE_OTHER): Payer: BC Managed Care – PPO | Admitting: Family Medicine

## 2013-12-10 VITALS — BP 110/74 | HR 90 | Temp 98.5°F | Resp 16 | Ht 68.75 in | Wt 147.6 lb

## 2013-12-10 DIAGNOSIS — M5430 Sciatica, unspecified side: Secondary | ICD-10-CM

## 2013-12-10 DIAGNOSIS — M79609 Pain in unspecified limb: Secondary | ICD-10-CM

## 2013-12-10 DIAGNOSIS — M549 Dorsalgia, unspecified: Secondary | ICD-10-CM

## 2013-12-10 DIAGNOSIS — M79604 Pain in right leg: Secondary | ICD-10-CM

## 2013-12-10 MED ORDER — CYCLOBENZAPRINE HCL 10 MG PO TABS: 10.0000 mg | ORAL_TABLET | Freq: Two times a day (BID) | ORAL | Status: DC | PRN

## 2013-12-10 MED ORDER — PREDNISONE 20 MG PO TABS: ORAL_TABLET | ORAL | Status: DC

## 2013-12-10 MED ORDER — HYDROCODONE-ACETAMINOPHEN 5-325 MG PO TABS: 1.0000 | ORAL_TABLET | Freq: Four times a day (QID) | ORAL | Status: DC | PRN

## 2013-12-10 NOTE — Patient Instructions (Signed)
We are going to treat you for sciatica today- use the prednisone as directed (while you are on prednisone do not use NSAID medications such as aleve or ibuprofen).  You can use the muscle relaxer and/ or the pain medication as needed for pain. Remember both of these can make you feel sleepy; avoid taking both together.    Let me know if you are not better in the next few days- Sooner if worse.   We will be glad to do your Depo shot during your window as on your calender- I'm afraid you are really too early today

## 2013-12-10 NOTE — Progress Notes (Signed)
[facility] [address] [place] [address] [phone]  Date:  12/10/2013   Name:  Stacey Key   DOB:  [birth year]   MRN:  [id number]  PCP:  No PCP Per Patient    Chief Complaint: Back Pain and Depo Shot   History of Present Illness:  Stacey Key is a 28 y.o. very pleasant female patient who presents with the following:  Here today with a back problem.  She had this same sort of problem last year and it eventually got better.  She has had some minor flares over the last year, but worse over the last week or so.   She is not aware of any injury. 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   She has pain in her left lower back and down her left leg.  The left leg feels heavy and painful when she walks  She may notice some difficulty with bending her left knee while walking.  No bowel or bladder incontinence.   She thinks she did have some x-ray in the past- they were ok as far as she knows.  She has never had an MRI She is otherwise generally healthy She would like to get her Depo today if she can- however she is fairly early  She had depo on 10/14/2013; this was her first shot.  She is not due until 4/1  Patient Active Problem List   Diagnosis Date Noted  . Arthritis 06/25/2012  . Ovarian cyst, complex 05/05/2012  . GERD (gastroesophageal reflux disease) 11/13/2011    Past Medical History  Diagnosis Date  . No pertinent past medical history   . Ovarian cyst   . Allergy   . Arthritis   . Anemia   . Vitamin D deficiency     Past Surgical History  Procedure Laterality Date  . Hernia repair      History  Substance Use Topics  . Smoking status: Never Smoker   . Smokeless tobacco: Never Used  . Alcohol Use: No    Family History  Problem Relation Age of Onset  . Other Neg Hx   . Anemia Mother   . GER disease Mother   . Arthritis Maternal Grandmother   . Hypertension Maternal Grandmother   .  Hypertension Maternal Grandfather   . Clotting disorder Maternal Grandfather   . Hypertension Paternal Grandfather     No Known Allergies  Medication list has been reviewed and updated.  Current Outpatient Prescriptions on File Prior to Visit  Medication Sig Dispense Refill  . fluticasone (FLONASE) 50 MCG/ACT nasal spray Place 2 sprays into the nose daily.  1 g  11  . lansoprazole (PREVACID) 30 MG capsule TAKE ONE CAPSULE BY MOUTH EVERY DAY  30 capsule  2  . naproxen (NAPROSYN) 500 MG tablet Take 1 tablet (500 mg total) by mouth 2 (two) times daily with a meal.  30 tablet  0  . HYDROcodone-acetaminophen (NORCO/VICODIN) 5-325 MG per tablet Take 1 tablet by mouth every 6 (six) hours as needed for moderate pain.  10 tablet  0  . HYDROcodone-homatropine (HYCODAN) 5-1.5 MG/5ML syrup Take 5 mLs by mouth every 6 (six) hours as needed for cough.  120 mL  0  . predniSONE (DELTASONE) 10 MG tablet 6 tabs po qam d1, 5 tabs po d2, 4 tabs po d3, 3 tab po d4, 2 tab po d5, 1 tab po  d6  21 tablet  0  . SPRINTEC 28 0.25-35 MG-MCG tablet TAKE 1 TABLET BY MOUTH DAILY.  28 tablet  9   No current facility-administered medications on file prior to visit.    Review of Systems:  As per HPI- otherwise negative.   Physical Examination: Filed Vitals:   12/10/13 1031  BP: 110/74  Pulse: 90  Temp: 98.5 F (36.9 C)  Resp: 16   Filed Vitals:   12/10/13 1031  Height: 5' 8.75" (1.746 m)  Weight: 147 lb 9.6 oz (66.951 kg)   Body mass index is 21.96 kg/(m^2). Ideal Body Weight: Weight in (lb) to have BMI = 25: 167.7  GEN: WDWN, NAD, Non-toxic, A & O x 3, looks well HEENT: Atraumatic, Normocephalic. Neck supple. No masses, No LAD.  Ears and Nose: No external deformity. CV: RRR, No M/G/R. No JVD. No thrill. No extra heart sounds. PULM: CTA B, no wheezes, crackles, rhonchi. No retractions. No resp. distress. No accessory muscle use. EXTR: No c/c/e NEURO Normal gait.  PSYCH: Normally interactive.  Conversant. Not depressed or anxious appearing.  Calm demeanor.  No inner thigh numbness She is tender over the left sciatic notch. She has restricted flexion of her spine. Decreased strength left leg, but normal sensation and DTR.  No saddle anesthesia.  Positive SLR on the left only  Assessment and Plan: Sciatica - Plan: predniSONE (DELTASONE) 20 MG tablet, cyclobenzaprine (FLEXERIL) 10 MG tablet  Pain of back and right lower extremity - Plan: HYDROcodone-acetaminophen (NORCO/VICODIN) 5-325 MG per tablet  Likely sciatica causing nerve pain down the left leg.  Prednisone as directed, vicodin and flexeril as needed.  Cautioned regarding sedation with these medication; if used together take 1/2 of each only.    She reports having had x-rays in the recent past  See patient instructions for more details.      Signed Lamar Blinks, MD

## 2013-12-30 ENCOUNTER — Ambulatory Visit (INDEPENDENT_AMBULATORY_CARE_PROVIDER_SITE_OTHER): Payer: BC Managed Care – PPO | Admitting: *Deleted

## 2013-12-30 DIAGNOSIS — Z309 Encounter for contraceptive management, unspecified: Secondary | ICD-10-CM

## 2013-12-30 MED ORDER — MEDROXYPROGESTERONE ACETATE 150 MG/ML IM SUSP
150.0000 mg | Freq: Once | INTRAMUSCULAR | Status: AC
  Administered 2013-12-30: 150 mg via INTRAMUSCULAR

## 2013-12-30 NOTE — Addendum Note (Signed)
Addended by: Dan Humphreys on: 12/30/2013 09:24 AM   Modules accepted: Level of Service

## 2013-12-30 NOTE — Addendum Note (Signed)
Addended by: Dan Humphreys on: 12/30/2013 09:07 AM   Modules accepted: Orders

## 2014-03-16 ENCOUNTER — Encounter (HOSPITAL_COMMUNITY): Payer: Self-pay | Admitting: Emergency Medicine

## 2014-03-16 ENCOUNTER — Emergency Department (HOSPITAL_COMMUNITY)
Admission: EM | Admit: 2014-03-16 | Discharge: 2014-03-17 | Disposition: A | Discharge status: Home / Self Care | Payer: BC Managed Care – PPO | Attending: Emergency Medicine | Admitting: Emergency Medicine

## 2014-03-16 DIAGNOSIS — Z8742 Personal history of other diseases of the female genital tract: Secondary | ICD-10-CM | POA: Insufficient documentation

## 2014-03-16 DIAGNOSIS — Z862 Personal history of diseases of the blood and blood-forming organs and certain disorders involving the immune mechanism: Secondary | ICD-10-CM | POA: Insufficient documentation

## 2014-03-16 DIAGNOSIS — E860 Dehydration: Secondary | ICD-10-CM | POA: Insufficient documentation

## 2014-03-16 DIAGNOSIS — IMO0002 Reserved for concepts with insufficient information to code with codable children: Secondary | ICD-10-CM | POA: Insufficient documentation

## 2014-03-16 DIAGNOSIS — Z8739 Personal history of other diseases of the musculoskeletal system and connective tissue: Secondary | ICD-10-CM | POA: Insufficient documentation

## 2014-03-16 DIAGNOSIS — Z3202 Encounter for pregnancy test, result negative: Secondary | ICD-10-CM | POA: Insufficient documentation

## 2014-03-16 DIAGNOSIS — R55 Syncope and collapse: Secondary | ICD-10-CM | POA: Insufficient documentation

## 2014-03-16 LAB — CBC
HEMATOCRIT: 37.5 % (ref 36.0–46.0)
Hemoglobin: 12.1 g/dL (ref 12.0–15.0)
MCH: 28.1 pg (ref 26.0–34.0)
MCHC: 32.3 g/dL (ref 30.0–36.0)
MCV: 87 fL (ref 78.0–100.0)
Platelets: 260 10*3/uL (ref 150–400)
RBC: 4.31 MIL/uL (ref 3.87–5.11)
RDW: 12.1 % (ref 11.5–15.5)
WBC: 6.1 10*3/uL (ref 4.0–10.5)

## 2014-03-16 LAB — URINE MICROSCOPIC-ADD ON

## 2014-03-16 LAB — BASIC METABOLIC PANEL WITH GFR
BUN: 8 mg/dL (ref 6–23)
CO2: 23 meq/L (ref 19–32)
Calcium: 9.2 mg/dL (ref 8.4–10.5)
Chloride: 102 meq/L (ref 96–112)
Creatinine, Ser: 0.8 mg/dL (ref 0.50–1.10)
GFR calc Af Amer: >90 mL/min
GFR calc non Af Amer: >90 mL/min
Glucose, Bld: 93 mg/dL (ref 70–99)
Potassium: 4.4 meq/L (ref 3.7–5.3)
Sodium: 140 meq/L (ref 137–147)

## 2014-03-16 LAB — URINALYSIS, ROUTINE W REFLEX MICROSCOPIC
Bilirubin Urine: NEGATIVE
Glucose, UA: NEGATIVE mg/dL
Hgb urine dipstick: NEGATIVE
Ketones, ur: NEGATIVE mg/dL
Nitrite: NEGATIVE
Protein, ur: NEGATIVE mg/dL
Specific Gravity, Urine: 1.015 (ref 1.005–1.030)
Urobilinogen, UA: 1 mg/dL (ref 0.0–1.0)
pH: 6 (ref 5.0–8.0)

## 2014-03-16 LAB — CBG MONITORING, ED: Glucose-Capillary: 97 mg/dL (ref 70–99)

## 2014-03-16 LAB — PREGNANCY, URINE: PREG TEST UR: NEGATIVE

## 2014-03-16 MED ORDER — METOCLOPRAMIDE HCL 5 MG/ML IJ SOLN
10.0000 mg | Freq: Once | INTRAMUSCULAR | Status: AC
  Administered 2014-03-16: 10 mg via INTRAVENOUS
  Filled 2014-03-16: qty 2

## 2014-03-16 MED ORDER — BUTALBITAL-APAP-CAFFEINE 50-325-40 MG PO TABS: 1.0000 | ORAL_TABLET | Freq: Four times a day (QID) | ORAL | Status: DC | PRN

## 2014-03-16 MED ORDER — SODIUM CHLORIDE 0.9 % IV SOLN: INTRAVENOUS | Status: DC

## 2014-03-16 MED ORDER — SODIUM CHLORIDE 0.9 % IV BOLUS (SEPSIS)
1000.0000 mL | Freq: Once | INTRAVENOUS | Status: AC
  Administered 2014-03-16: 1000 mL via INTRAVENOUS

## 2014-03-16 MED ORDER — DEXAMETHASONE SODIUM PHOSPHATE 10 MG/ML IJ SOLN
10.0000 mg | Freq: Once | INTRAMUSCULAR | Status: AC
  Administered 2014-03-16: 10 mg via INTRAVENOUS
  Filled 2014-03-16: qty 1

## 2014-03-16 MED ORDER — DIPHENHYDRAMINE HCL 50 MG/ML IJ SOLN
25.0000 mg | Freq: Once | INTRAMUSCULAR | Status: AC
  Administered 2014-03-16: 25 mg via INTRAVENOUS
  Filled 2014-03-16: qty 1

## 2014-03-16 NOTE — ED Notes (Signed)
Patient is alert and oriented, c spine cleared at scene, c/o right hip/groin pain and

## 2014-03-16 NOTE — ED Notes (Signed)
Per EMS:  At church, syncopal episode, loc for 5 mins, c/o headache before to friends, it was hot at church.  Upon ems arrival patient was unconsciousness, now is awake.  C/o right hip pain, blurred vision with headache and sensitive to light at this time.  CBG 115

## 2014-03-16 NOTE — Discharge Instructions (Signed)
Push fluids: take small frequent sips of water or Gatorade, do not drink any soda, juice or caffeinated beverages.    Please follow with your primary care doctor in the next 2 days for a check-up. They must obtain records for further management.   Do not hesitate to return to the Emergency Department for any new, worsening or concerning symptoms.    Dehydration, Adult Dehydration is when you lose more fluids from the body than you take in. Vital organs like the kidneys, brain, and heart cannot function without a proper amount of fluids and salt. Any loss of fluids from the body can cause dehydration.  CAUSES   Vomiting.  Diarrhea.  Excessive sweating.  Excessive urine output.  Fever. SYMPTOMS  Mild dehydration  Thirst.  Dry lips.  Slightly dry mouth. Moderate dehydration  Very dry mouth.  Sunken eyes.  Skin does not bounce back quickly when lightly pinched and released.  Dark urine and decreased urine production.  Decreased tear production.  Headache. Severe dehydration  Very dry mouth.  Extreme thirst.  Rapid, weak pulse (more than 100 beats per minute at rest).  Cold hands and feet.  Not able to sweat in spite of heat and temperature.  Rapid breathing.  Blue lips.  Confusion and lethargy.  Difficulty being awakened.  Minimal urine production.  No tears. DIAGNOSIS  Your caregiver will diagnose dehydration based on your symptoms and your exam. Blood and urine tests will help confirm the diagnosis. The diagnostic evaluation should also identify the cause of dehydration. TREATMENT  Treatment of mild or moderate dehydration can often be done at home by increasing the amount of fluids that you drink. It is best to drink small amounts of fluid more often. Drinking too much at one time can make vomiting worse. Refer to the home care instructions below. Severe dehydration needs to be treated at the hospital where you will probably be given intravenous (IV)  fluids that contain water and electrolytes. HOME CARE INSTRUCTIONS   Ask your caregiver about specific rehydration instructions.  Drink enough fluids to keep your urine clear or pale yellow.  Drink small amounts frequently if you have nausea and vomiting.  Eat as you normally do.  Avoid:  Foods or drinks high in sugar.  Carbonated drinks.  Juice.  Extremely hot or cold fluids.  Drinks with caffeine.  Fatty, greasy foods.  Alcohol.  Tobacco.  Overeating.  Gelatin desserts.  Wash your hands well to avoid spreading bacteria and viruses.  Only take over-the-counter or prescription medicines for pain, discomfort, or fever as directed by your caregiver.  Ask your caregiver if you should continue all prescribed and over-the-counter medicines.  Keep all follow-up appointments with your caregiver. SEEK MEDICAL CARE IF:  You have abdominal pain and it increases or stays in one area (localizes).  You have a rash, stiff neck, or severe headache.  You are irritable, sleepy, or difficult to awaken.  You are weak, dizzy, or extremely thirsty. SEEK IMMEDIATE MEDICAL CARE IF:   You are unable to keep fluids down or you get worse despite treatment.  You have frequent episodes of vomiting or diarrhea.  You have blood or green matter (bile) in your vomit.  You have blood in your stool or your stool looks black and tarry.  You have not urinated in 6 to 8 hours, or you have only urinated a small amount of very dark urine.  You have a fever.  You faint. MAKE SURE YOU:   Understand  these instructions.  Will watch your condition.  Will get help right away if you are not doing well or get worse. Document Released: 09/17/2005 Document Revised: 12/10/2011 Document Reviewed: 05/07/2011 [facility] Patient Information 2014 [place], [place].

## 2014-03-16 NOTE — ED Provider Notes (Signed)
CSN: [id number]     Arrival date & time 03/16/14  2053 History   None    Chief Complaint  Patient presents with  . Loss of Consciousness     (Consider location/radiation/quality/duration/timing/severity/associated sxs/prior Treatment) HPI   Stacey Key is a 28 y.o. female complaining of syncopal event at church happened about 8 PM tonight. Patient had a headache, was feeling nauseous and thirsty before this happened, she stood up to go to a glass of water and passed out. There was witnessed but none of the people that witnessed the event her in the ED and they cannot be reached on there phones. Unclear if there was head trauma. Patient was told that she was out for 5 minutes. When patient woke up she was not postictal, she did not lose control of bowel or bladder continence. She has a headache which is slightly more severe than her typical headache, it is frontal and she has photophobia. Patient states that she's been eating normally but she only drinks water with meals, states it was very hot in church.  Past Medical History  Diagnosis Date  . No pertinent past medical history   . Ovarian cyst   . Allergy   . Arthritis   . Anemia   . Vitamin D deficiency    Past Surgical History  Procedure Laterality Date  . Hernia repair     Family History  Problem Relation Age of Onset  . Other Neg Hx   . Anemia Mother   . GER disease Mother   . Arthritis Maternal Grandmother   . Hypertension Maternal Grandmother   . Hypertension Maternal Grandfather   . Clotting disorder Maternal Grandfather   . Hypertension Paternal Grandfather    History  Substance Use Topics  . Smoking status: Never Smoker   . Smokeless tobacco: Never Used  . Alcohol Use: No   OB History   Grav Para Term Preterm Abortions TAB SAB Ect Mult Living   0              Review of Systems  10 systems reviewed and found to be negative, except as noted in the HPI.   Allergies  Review of patient's allergies  indicates no known allergies.  Home Medications   Prior to Admission medications   Medication Sig Start Date End Date Taking? Authorizing Provider  butalbital-acetaminophen-caffeine (FIORICET) 50-325-40 MG per tablet Take 1-2 tablets by mouth every 6 (six) hours as needed for headache. 03/16/14 03/16/15  Elmyra Ricks Pisciotta, PA-C  Diphenhyd-Hydrocort-Nystatin (FIRST-DUKES MOUTHWASH) SUSP 1 teaspoon as rinse gargle and spit 4 times a day 03/22/14   Darlyne Russian, MD  fluticasone (FLONASE) 50 MCG/ACT nasal spray Place 2 sprays into both nostrils daily. 03/22/14   Darlyne Russian, MD   BP 112/72  Pulse 90  Temp(Src) 98.9 F (37.2 C) (Rectal)  Resp 18  SpO2 100% Physical Exam  Nursing note and vitals reviewed. Constitutional: She is oriented to person, place, and time. She appears well-developed and well-nourished. No distress.  HENT:  Head: Normocephalic.  Eyes: Conjunctivae and EOM are normal. Pupils are equal, round, and reactive to light.  Neck: Normal range of motion.  No midline C-spine  tenderness to palpation or step-offs appreciated. Patient has full range of motion without pain.   Cardiovascular: Normal rate, regular rhythm and intact distal pulses.   Pulmonary/Chest: Effort normal and breath sounds normal. No stridor. No respiratory distress. She has no wheezes. She has no rales. She exhibits no tenderness.  Abdominal: Soft. Bowel sounds are normal. She exhibits no distension and no mass. There is no tenderness. There is no rebound and no guarding.  Musculoskeletal: Normal range of motion.  Neurological: She is alert and oriented to person, place, and time.  II-Visual fields grossly intact. III/IV/VI-Extraocular movements intact.  Pupils reactive bilaterally. V/VII-Smile symmetric, equal eyebrow raise,  facial sensation intact VIII- Hearing grossly intact IX/X-Normal gag XI-bilateral shoulder shrug XII-midline tongue extension Motor: 5/5 bilaterally with normal tone and  bulk Cerebellar: Normal finger-to-nose  and normal heel-to-shin test.   Romberg negative Ambulates with a coordinated gait   Psychiatric: She has a normal mood and affect.    ED Course  Procedures (including critical care time) Labs Review Labs Reviewed  URINALYSIS, ROUTINE W REFLEX MICROSCOPIC - Abnormal; Notable for the following:    APPearance CLOUDY (*)    Leukocytes, UA SMALL (*)    All other components within normal limits  URINE MICROSCOPIC-ADD ON - Abnormal; Notable for the following:    Squamous Epithelial / LPF FEW (*)    Bacteria, UA FEW (*)    All other components within normal limits  BASIC METABOLIC PANEL  CBC  PREGNANCY, URINE  CBG MONITORING, ED    Imaging Review No results found.   EKG Interpretation   Date/Time:  Tuesday March 16 2014 21:35:20 EDT Ventricular Rate:  97 PR Interval:  178 QRS Duration: 72 QT Interval:  364 QTC Calculation: 462 R Axis:   85 Text Interpretation:  Normal sinus rhythm Normal ECG ED PHYSICIAN  INTERPRETATION AVAILABLE IN [facility] Confirmed by TEST, Record  ([id number]) on 03/18/2014 12:31:10 PM      MDM   Final diagnoses:  Dehydration  Syncope    Filed Vitals:   03/16/14 2107 03/16/14 2245 03/16/14 2259 03/17/14 0001  BP: 117/83  101/67 112/72  Pulse: 97  86 90  Temp: 99 F (37.2 C) 98.9 F (37.2 C)    TempSrc: Oral Rectal    Resp: 20  16 18   SpO2: 100%  100% 100%    Medications  sodium chloride 0.9 % bolus 1,000 mL (0 mLs Intravenous Stopped 03/17/14 0000)  dexamethasone (DECADRON) injection 10 mg (10 mg Intravenous Given 03/16/14 2310)  metoCLOPramide (REGLAN) injection 10 mg (10 mg Intravenous Given 03/16/14 2313)  diphenhydrAMINE (BENADRYL) injection 25 mg (25 mg Intravenous Given 03/16/14 2308)    Stacey Key is a 28 y.o. female presenting with syncope. Mentating at her baseline. No loss of bowel or bladder control or post-ictal state: doubt seizure. EKG with no arrythmia. Pt is not pregnant, no  concern for ectopic.   Evaluation does not show pathology that would require ongoing emergent intervention or inpatient treatment. Pt is hemodynamically stable and mentating appropriately. Discussed findings and plan with patient/guardian, who agrees with care plan. All questions answered. Return precautions discussed and outpatient follow up given.   Discharge Medication List as of 03/16/2014 11:55 PM    START taking these medications   Details  butalbital-acetaminophen-caffeine (FIORICET) 50-325-40 MG per tablet Take 1-2 tablets by mouth every 6 (six) hours as needed for headache., Starting 03/16/2014, Until Wed 03/16/15, [employer], PA-C 03/23/14 1649

## 2014-03-17 NOTE — ED Notes (Signed)
Pt A&O4, wheeled out of ED via wheelchair, NAD 

## 2014-03-22 ENCOUNTER — Ambulatory Visit (INDEPENDENT_AMBULATORY_CARE_PROVIDER_SITE_OTHER): Payer: BC Managed Care – PPO | Admitting: Emergency Medicine

## 2014-03-22 VITALS — HR 96 | Temp 98.4°F | Resp 16 | Ht 68.0 in | Wt 152.6 lb

## 2014-03-22 DIAGNOSIS — J3489 Other specified disorders of nose and nasal sinuses: Secondary | ICD-10-CM

## 2014-03-22 DIAGNOSIS — J029 Acute pharyngitis, unspecified: Secondary | ICD-10-CM

## 2014-03-22 DIAGNOSIS — Z309 Encounter for contraceptive management, unspecified: Secondary | ICD-10-CM

## 2014-03-22 DIAGNOSIS — R0981 Nasal congestion: Secondary | ICD-10-CM

## 2014-03-22 LAB — POCT RAPID STREP A (OFFICE): Rapid Strep A Screen: NEGATIVE

## 2014-03-22 MED ORDER — FIRST-DUKES MOUTHWASH MT SUSP: OROMUCOSAL | Status: DC

## 2014-03-22 MED ORDER — FLUTICASONE PROPIONATE 50 MCG/ACT NA SUSP: 2.0000 | Freq: Every day | NASAL | Status: DC

## 2014-03-22 MED ORDER — MEDROXYPROGESTERONE ACETATE 150 MG/ML IM SUSP
150.0000 mg | Freq: Once | INTRAMUSCULAR | Status: AC
  Administered 2014-03-22: 150 mg via INTRAMUSCULAR

## 2014-03-22 NOTE — Progress Notes (Signed)
   Subjective:    Patient ID: Stacey Key, female    DOB: [birth year], 28 y.o.   MRN: [id number]  HPI 28 year old female pt presents with headache, sore throat, sinus congestion, and depo provera injection. Pt started noticing symptoms last night. Went to the ED by ambulance last Tuesday for heat exhaustion. Was given IV fluids. When she blows her nose she gets brown mucous out. Pt has seasonal allergies. She has had pain in her left ear. Uses flonase for her allergies.    Review of Systems     Objective:   Physical Exam  Constitutional: She appears well-developed and well-nourished.  HENT:  Head: Normocephalic.  Right Ear: External ear normal.  Left Ear: External ear normal.  There is significant nasal congest  Eyes: Pupils are equal, round, and reactive to light.  Neck: Normal range of motion. No tracheal deviation present. No thyromegaly present.  Cardiovascular: Normal rate and regular rhythm.   Skin: Skin is warm and dry.   Results for orders placed in visit on 03/22/14  POCT RAPID STREP A (OFFICE)      Result Value Ref Range   Rapid Strep A Screen Negative  Negative         Assessment & Plan:  Patient with symptoms of allergic rhinitis versus viral pharyngitis. She she was advised to take either Claritin or Zyrtec. Prescription for called in for dukes mouthwash and Flonase.

## 2014-03-22 NOTE — Patient Instructions (Signed)

## 2014-03-24 LAB — CULTURE, GROUP A STREP: Organism ID, Bacteria: NORMAL

## 2014-03-24 NOTE — ED Provider Notes (Signed)
Medical screening examination/treatment/procedure(s) were performed by non-physician practitioner and as supervising physician I was immediately available for consultation/collaboration.   EKG Interpretation   Date/Time:  Tuesday March 16 2014 21:35:20 EDT Ventricular Rate:  97 PR Interval:  178 QRS Duration: 72 QT Interval:  364 QTC Calculation: 462 R Axis:   85 Text Interpretation:  Normal sinus rhythm Normal ECG ED PHYSICIAN  INTERPRETATION AVAILABLE IN [facility] Confirmed by TEST, Record  ([id number]) on 03/18/2014 12:31:10 PM       Richarda Blade, MD 03/24/14 [phone]

## 2014-05-21 ENCOUNTER — Ambulatory Visit (INDEPENDENT_AMBULATORY_CARE_PROVIDER_SITE_OTHER): Payer: BC Managed Care – PPO | Admitting: Internal Medicine

## 2014-05-21 VITALS — BP 118/64 | HR 82 | Temp 98.2°F | Resp 16 | Ht 68.0 in | Wt 157.4 lb

## 2014-05-21 DIAGNOSIS — S90569A Insect bite (nonvenomous), unspecified ankle, initial encounter: Secondary | ICD-10-CM

## 2014-05-21 DIAGNOSIS — S80862A Insect bite (nonvenomous), left lower leg, initial encounter: Secondary | ICD-10-CM

## 2014-05-21 DIAGNOSIS — M5430 Sciatica, unspecified side: Secondary | ICD-10-CM

## 2014-05-21 DIAGNOSIS — M5442 Lumbago with sciatica, left side: Secondary | ICD-10-CM

## 2014-05-21 DIAGNOSIS — W57XXXA Bitten or stung by nonvenomous insect and other nonvenomous arthropods, initial encounter: Secondary | ICD-10-CM

## 2014-05-21 DIAGNOSIS — J019 Acute sinusitis, unspecified: Secondary | ICD-10-CM

## 2014-05-21 MED ORDER — FLUOCINONIDE 0.05 % EX CREA: 1.0000 "application " | TOPICAL_CREAM | Freq: Two times a day (BID) | CUTANEOUS | Status: DC

## 2014-05-21 MED ORDER — AMOXICILLIN 875 MG PO TABS: 875.0000 mg | ORAL_TABLET | Freq: Two times a day (BID) | ORAL | Status: DC

## 2014-05-21 MED ORDER — PREDNISONE 20 MG PO TABS: ORAL_TABLET | ORAL | Status: DC

## 2014-05-21 NOTE — Progress Notes (Signed)
Subjective:  This chart was scribed for Stacey Koyanagi, MD by Stacey Key, ED Scribe. This patient was seen in room Room/bed info 8 and the patient's care was started at 1:57 PM.   Patient ID: Stacey Key, female    DOB: [birth year], 28 y.o.   MRN: [id number]  HPI   HPI Comments: Stacey Key is a 28 y.o. female who presents to the [facility] complaining of insect bite to the left leg that occurred last night. She reports she was fine when she went to sleep, however, upon waking, she noticed a small bump on her left leg. She states that, throughout the day, the bump became painful and swollen. Patient denies contact with any wooded areas. She denies fever, chills, nausea, or emesis. Patient has history of arthritis, ovarian cyst, and GERD.  Left ear intermittently pain for 2 months wit ringing. Frequent HA in the frontal areas for the same interval. Pressure pain in head when bending over. Frequent allergy problems since Spring. No fever or cough.  Left leg hurts when the ear hurts with pain radiating down the posterior aspect from thigh to foot. History of college athletics with volleyball, high jump, and long jump, but currently very out of shape. Has some nocturnal and early morning lumbar stiffness. No sensory or motor losses. Now much more sedentary as a college student    Patient Active Problem List   Diagnosis Date Noted  . Arthritis 06/25/2012  . Ovarian cyst, complex 05/05/2012  . GERD (gastroesophageal reflux disease) 11/13/2011   all problems stable No current medications   Review of Systems  Constitutional: Negative for fever and chills.  HENT: Positive for ear pain (left ear) and tinnitus (left ear).   Respiratory: Negative for cough.   Musculoskeletal: Positive for myalgias (left leg).  Neurological: Positive for headaches.       Objective:   Physical Exam  HENT:  Eyes clear. TMs intact. Nares boggy. Throat clear. Tender left  frontal sinus to percussion. No cervical nodes. Thyroid nontender without nodules.  Musculoskeletal:  Straight leg raise positive on the left at 75 degrees with radicular symptoms. DTRs and sensory intact.  Skin:  On the posteriour aspect of the left lower extermity is a bullous lesion 1 cm with slight redness. Clear fluid drained with 18g.    BP 118/64  Pulse 82  Temp(Src) 98.2 F (36.8 C) (Oral)  Resp 16  Ht 5\' 8"  (1.727 m)  Wt 157 lb 6.4 oz (71.396 kg)  BMI 23.94 kg/m2  SpO2 100%     Assessment & Plan:   Left-sided low back pain with left-sided sciatica  2 start a stretching and strengthening program Acute sinusitis, unspecified  Meds below Insect bite of leg, left, initial encounter  Keep clean Meds ordered this encounter  Medications  . fluocinonide cream (LIDEX) 0.05 %    Sig: Apply 1 application topically 2 (two) times daily.    Dispense:  30 g    Refill:  0  . amoxicillin (AMOXIL) 875 MG tablet    Sig: Take 1 tablet (875 mg total) by mouth 2 (two) times daily.    Dispense:  20 tablet    Refill:  0  . predniSONE (DELTASONE) 20 MG tablet    Sig: 3/3/2/2/1/1 single daily dose for 6 days    Dispense:  12 tablet    Refill:  0     I have completed the patient encounter in its entirety as documented  by the scribe, with editing by me where necessary. Stacey Key P. Laney Pastor, M.D.

## 2014-06-05 ENCOUNTER — Ambulatory Visit (INDEPENDENT_AMBULATORY_CARE_PROVIDER_SITE_OTHER): Payer: BC Managed Care – PPO

## 2014-06-05 DIAGNOSIS — Z309 Encounter for contraceptive management, unspecified: Secondary | ICD-10-CM

## 2014-06-05 MED ORDER — MEDROXYPROGESTERONE ACETATE 150 MG/ML IM SUSP
150.0000 mg | Freq: Once | INTRAMUSCULAR | Status: AC
  Administered 2014-06-05: 150 mg via INTRAMUSCULAR

## 2014-06-05 NOTE — Progress Notes (Signed)
   Subjective:    Patient ID: Stacey Key, female    DOB: 26-Nov-1985, 26 y.o.   MRN: [id number]  HPI Pt here for Depo-Provera shot. Two days early, but able to get it per [employer].   Review of Systems     Objective:   Physical Exam        Assessment & Plan:  Administered Depo-Provera shot.

## 2014-08-14 ENCOUNTER — Ambulatory Visit (INDEPENDENT_AMBULATORY_CARE_PROVIDER_SITE_OTHER): Payer: BC Managed Care – PPO | Admitting: Family Medicine

## 2014-08-14 VITALS — BP 110/70 | HR 85 | Temp 98.8°F | Resp 16 | Ht 69.0 in | Wt 163.0 lb

## 2014-08-14 DIAGNOSIS — H6522 Chronic serous otitis media, left ear: Secondary | ICD-10-CM

## 2014-08-14 MED ORDER — LEVOFLOXACIN 500 MG PO TABS: 500.0000 mg | ORAL_TABLET | Freq: Every day | ORAL | Status: DC

## 2014-08-14 NOTE — Patient Instructions (Signed)
Continue the Flonase (fluticasone) nasal spray. Take the new antibiotic once a day. Call by Monday if the pain is not decreasing significantly.

## 2014-08-14 NOTE — Progress Notes (Signed)
   Subjective:    Patient ID: Stacey Key, female    DOB: 1986/06/26, 28 y.o.   MRN: [id number]  HPI Chief Complaint  Patient presents with  . Otalgia    Left ear x 2 weeks   This chart was scribed for [name], MD by Thea Alken, ED Scribe. This patient was seen in room 2 and the patient's care was started at 3:15 PM.  HPI Comments: Stacey Key is a 28 y.o. female who presents to the [facility] complaining of sharp, stabbing left otalgia with associated nausea, cough, dizziness, tinnitus, and congestion. Pt states she was seen her in the past for similar symptoms and was diagnosed with sinus infection and has tried multiple medications. Pt states she was doing well while taking the medication but as soon as she finished the dose symptoms came back.  Past Medical History  Diagnosis Date  . No pertinent past medical history   . Ovarian cyst   . Allergy   . Arthritis   . Anemia   . Vitamin D deficiency    No Known Allergies Prior to Admission medications   Medication Sig Start Date End Date Taking? Authorizing Provider  fluticasone (FLONASE) 50 MCG/ACT nasal spray Place 2 sprays into both nostrils daily. 03/22/14  Yes Darlyne Russian, MD  butalbital-acetaminophen-caffeine (FIORICET) [phone] MG per tablet Take 1-2 tablets by mouth every 6 (six) hours as needed for headache. 03/16/14 03/16/15  Monico Blitz, PA-C   Review of Systems  HENT: Positive for congestion, ear pain and tinnitus.   Respiratory: Positive for cough.   Gastrointestinal: Positive for nausea.  Neurological: Positive for dizziness.    Objective:   Physical Exam  Constitutional: She is oriented to person, place, and time. She appears well-developed and well-nourished. No distress.  HENT:  Head: Normocephalic and atraumatic.  Dull left ear drum without erythema or significant effusion.  Eyes: Conjunctivae and EOM are normal.  Neck: Neck supple.  Cardiovascular: Normal rate.     Pulmonary/Chest: Effort normal.  Musculoskeletal: Normal range of motion.  Neurological: She is alert and oriented to person, place, and time.  Skin: Skin is warm and dry.  Psychiatric: She has a normal mood and affect. Her behavior is normal.  Nursing note and vitals reviewed.  Assessment & Plan:   1. Left chronic serous otitis media     Meds ordered this encounter  Medications  . levofloxacin (LEVAQUIN) 500 MG tablet    Sig: Take 1 tablet (500 mg total) by mouth daily.    Dispense:  7 tablet    Refill:  0   .if not improving in the next 2 days, patient will need an ENT referral  Signed, Carola Frost.D.

## 2014-08-28 ENCOUNTER — Ambulatory Visit (INDEPENDENT_AMBULATORY_CARE_PROVIDER_SITE_OTHER): Payer: BC Managed Care – PPO | Admitting: Family Medicine

## 2014-08-28 VITALS — BP 116/62 | HR 86 | Temp 98.3°F | Resp 18 | Ht 68.0 in | Wt 161.4 lb

## 2014-08-28 DIAGNOSIS — K120 Recurrent oral aphthae: Secondary | ICD-10-CM

## 2014-08-28 DIAGNOSIS — H9202 Otalgia, left ear: Secondary | ICD-10-CM

## 2014-08-28 DIAGNOSIS — Z3042 Encounter for surveillance of injectable contraceptive: Secondary | ICD-10-CM

## 2014-08-28 DIAGNOSIS — H6982 Other specified disorders of Eustachian tube, left ear: Secondary | ICD-10-CM

## 2014-08-28 MED ORDER — PREDNISONE 20 MG PO TABS: ORAL_TABLET | ORAL | Status: DC

## 2014-08-28 MED ORDER — MEDROXYPROGESTERONE ACETATE 150 MG/ML IM SUSP
150.0000 mg | Freq: Once | INTRAMUSCULAR | Status: AC
  Administered 2014-08-28: 150 mg via INTRAMUSCULAR

## 2014-08-28 MED ORDER — VALACYCLOVIR HCL 1 G PO TABS: 1000.0000 mg | ORAL_TABLET | Freq: Two times a day (BID) | ORAL | Status: DC

## 2014-08-28 NOTE — Progress Notes (Signed)
Subjective: 28 year old lady who's been having problems with the left ear off and on for a long stretch. She was treated with a tapered dose of prednisone back in the summer. It felt better transiently and then symptoms returned immediately upon completion of the prednisone course. She's been on fluticasone nose spray. She was treated with oral antibiotics a few weeks ago here. She is not really gotten fully well from her symptoms. She notices that the sounds like the children bouncing on the trampolines at work causes her problems and dizziness. She has itching of her ears and tries to clean it with a Q-tip.  Objective: TMs are entirely normal in appearance today. Throat clear. She does have a aphthous ulcer just on the soft palate anterior to the uvula. This appears to be a viral process. Chest clear. Heart regular without murmurs. Carotid bruits.  Assessment Left otalgia Viral ulcer in mouth  Plan: Valtrex for the oral sore ENT referral for evaluation of the ear Repeat one more round of tapered prednisone

## 2014-08-28 NOTE — Patient Instructions (Signed)
Your DepoProvera shot is given you today.  Take the prednisone 3 daily for 2 days, then 2 daily for 2 days, then 1 daily for 2 days. It is best taken earlier in the day, so recommend taking it after breakfast usually  Referral is being made to an ear nose and throat specialist  Return if worse at any time  You have a virus ulcer on the roof of your mouth. These can become very painful. I giving you a antiviral medication to take one twice daily for this.

## 2014-11-22 ENCOUNTER — Ambulatory Visit: Payer: BLUE CROSS/BLUE SHIELD

## 2014-12-02 ENCOUNTER — Ambulatory Visit (INDEPENDENT_AMBULATORY_CARE_PROVIDER_SITE_OTHER): Payer: BLUE CROSS/BLUE SHIELD | Admitting: Physician Assistant

## 2014-12-02 VITALS — HR 85 | Temp 98.5°F | Ht 68.5 in | Wt 164.0 lb

## 2014-12-02 DIAGNOSIS — Z309 Encounter for contraceptive management, unspecified: Secondary | ICD-10-CM

## 2014-12-02 DIAGNOSIS — J309 Allergic rhinitis, unspecified: Secondary | ICD-10-CM

## 2014-12-02 MED ORDER — CETIRIZINE HCL 10 MG PO TABS: 10.0000 mg | ORAL_TABLET | Freq: Every day | ORAL | Status: DC

## 2014-12-02 MED ORDER — MEDROXYPROGESTERONE ACETATE 150 MG/ML IM SUSP
150.0000 mg | Freq: Once | INTRAMUSCULAR | Status: AC
  Administered 2014-12-02: 150 mg via INTRAMUSCULAR

## 2014-12-02 NOTE — Patient Instructions (Signed)
Start taking zyrtec daily. Continue with flonase daily. Put vaseline in nose to moisture the mucosa. Return with any further problems concerns.

## 2014-12-02 NOTE — Progress Notes (Signed)
Subjective:    Patient ID: Stacey Key, female    DOB: 1985/11/05, 29 y.o.   MRN: [id number]  HPI  This is a 29 year old female presenting for with sinus issues and needing a depo injection.  States 1 week ago she woke up with a nosebleed. Since then has had small amount of blood on tissue paper with blowing her nose. No more nosebleeds. Left nostril is very sore. Pt has a history of allergic rhinitis, worse in the winter. She is having some slight cough as well. No sore throat, fever or chills.  She is also presenting for her depo-provera shot. She has been on the depo for the past year and not having any problems.  Review of Systems  Constitutional: Negative for fever and chills.  HENT: Positive for congestion and nosebleeds. Negative for sinus pressure and sore throat.   Eyes: Negative for pain.  Respiratory: Positive for cough.   Gastrointestinal: Negative for nausea, vomiting and diarrhea.  Skin: Negative for rash.  Allergic/Immunologic: Positive for environmental allergies.  Hematological: Negative for adenopathy.   Patient Active Problem List   Diagnosis Date Noted  . Arthritis 06/25/2012  . Ovarian cyst, complex 05/05/2012  . GERD (gastroesophageal reflux disease) 11/13/2011   Prior to Admission medications   Medication Sig Start Date End Date Taking? Authorizing Provider  fluticasone (FLONASE) 50 MCG/ACT nasal spray Place 2 sprays into both nostrils daily. 03/22/14  Yes Darlyne Russian, MD   No Known Allergies  Patient's social and family history were reviewed.     Objective:   Physical Exam  Constitutional: She is oriented to person, place, and time. She appears well-developed and well-nourished. No distress.  HENT:  Head: Normocephalic and atraumatic.  Right Ear: Hearing, tympanic membrane, external ear and ear canal normal.  Left Ear: Hearing, tympanic membrane, external ear and ear canal normal.  Nose: Mucosal edema present. No sinus tenderness.    Mouth/Throat: Uvula is midline, oropharynx is clear and moist and mucous membranes are normal.  Eyes: Conjunctivae and lids are normal. Right eye exhibits no discharge. Left eye exhibits no discharge. No scleral icterus.  Cardiovascular: Normal rate, regular rhythm, normal heart sounds, intact distal pulses and normal pulses.   No murmur heard. Pulmonary/Chest: Effort normal and breath sounds normal. No respiratory distress. She has no wheezes. She has no rhonchi. She has no rales.  Musculoskeletal: Normal range of motion.  Lymphadenopathy:    She has no cervical adenopathy.  Neurological: She is alert and oriented to person, place, and time.  Skin: Skin is warm, dry and intact. No lesion and no rash noted.  Psychiatric: She has a normal mood and affect. Her speech is normal and behavior is normal. Thought content normal.   Pulse 85  Temp(Src) 98.5 F (36.9 C) (Oral)  Ht 5' 8.5" (1.74 m)  Wt 164 lb (74.39 kg)  BMI 24.57 kg/m2  SpO2 98%     Assessment & Plan:  1. Allergic rhinitis, unspecified allergic rhinitis type Encouraged pt to continue daily flonase and start taking zyrtec daily. She will apply vaseline to nasal mucosa for dryness. She will return with further problems/concerns. - cetirizine (ZYRTEC) 10 MG tablet; Take 1 tablet (10 mg total) by mouth daily.  Dispense: 30 tablet; Refill: 11  2. Encounter for contraceptive management, unspecified encounter - medroxyPROGESTERone (DEPO-PROVERA) injection 150 mg; Inject 1 mL (150 mg total) into the muscle once.   Benjaman Pott Drenda Freeze, MHS [facility]  12/02/2014

## 2015-01-03 ENCOUNTER — Ambulatory Visit (INDEPENDENT_AMBULATORY_CARE_PROVIDER_SITE_OTHER): Payer: BLUE CROSS/BLUE SHIELD | Admitting: Physician Assistant

## 2015-01-03 VITALS — BP 100/72 | HR 103 | Temp 98.3°F | Resp 16 | Ht 68.5 in | Wt 169.8 lb

## 2015-01-03 DIAGNOSIS — J029 Acute pharyngitis, unspecified: Secondary | ICD-10-CM

## 2015-01-03 DIAGNOSIS — J302 Other seasonal allergic rhinitis: Secondary | ICD-10-CM | POA: Diagnosis not present

## 2015-01-03 DIAGNOSIS — R0981 Nasal congestion: Secondary | ICD-10-CM

## 2015-01-03 LAB — POCT CBC
GRANULOCYTE PERCENT: 56.9 % (ref 37–80)
HCT, POC: 41.6 % (ref 37.7–47.9)
Hemoglobin: 13.1 g/dL (ref 12.2–16.2)
Lymph, poc: 2.2 (ref 0.6–3.4)
MCH, POC: 27 pg (ref 27–31.2)
MCHC: 31.6 g/dL — AB (ref 31.8–35.4)
MCV: 85.7 fL (ref 80–97)
MID (CBC): 0.4 (ref 0–0.9)
MPV: 7.4 fL (ref 0–99.8)
PLATELET COUNT, POC: 294 10*3/uL (ref 142–424)
POC GRANULOCYTE: 3.4 (ref 2–6.9)
POC LYMPH %: 36.7 % (ref 10–50)
POC MID %: 6.4 %M (ref 0–12)
RBC: 4.86 M/uL (ref 4.04–5.48)
RDW, POC: 13.3 %
WBC: 5.9 10*3/uL (ref 4.6–10.2)

## 2015-01-03 LAB — POCT RAPID STREP A (OFFICE): Rapid Strep A Screen: NEGATIVE

## 2015-01-03 MED ORDER — METHYLPREDNISOLONE ACETATE 80 MG/ML IJ SUSP
80.0000 mg | Freq: Once | INTRAMUSCULAR | Status: AC
  Administered 2015-01-03: 80 mg via INTRAMUSCULAR

## 2015-01-03 NOTE — Patient Instructions (Signed)
Take your Flonase daily 2 sprays in each nostril every morning and night, in times of severe allergies.  You may decrease the dosage to 1 spray in each nostril in the morning and night when your symptoms are well controlled. You may take Zyrtec and Claritin together in times of severe allergies as well. I recommend you try these things in the future to control your symptoms, and it may save you an office visit.

## 2015-01-03 NOTE — Progress Notes (Signed)
01/03/2015 at 4:42 PM  Stacey Key / DOB: 05-04-1986 / MRN: [id number]  The patient has GERD (gastroesophageal reflux disease); Ovarian cyst, complex; and Arthritis on her problem list.  SUBJECTIVE  Chief complaint: Cough; Sore Throat; and Sinus Problem   History of present illness: Stacey Key is 29 y.o. well appearing female presenting for sinus congestion that started 7 days ago.  Stacey Key has a history of "terrible allergies" and started taking zyrtec and Flonase prn at the onset of the illness, and reports that Thursday her symptoms became worse. Stacey Key denies fever, but reports feeling chilled and endorses sore throat, myalgia, and has just started coughing today.  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 Stacey Key has also been taking Tylenol pm at night and reports that it has helped her symptoms mildly. Stacey Key had to take off work today.   Stacey Key  has a past medical history of No pertinent past medical history; Ovarian cyst; Allergy; Arthritis; Anemia; and Vitamin D deficiency.    Stacey Key has a current medication list which includes the following prescription(s): cetirizine and fluticasone, and the following Facility-Administered Medications: methylprednisolone acetate.  Stacey Key has No Known Allergies. Stacey Key  reports that Stacey Key has never smoked. Stacey Key has never used smokeless tobacco. Stacey Key reports that Stacey Key does not drink alcohol or use illicit drugs. Stacey Key  reports that Stacey Key does not engage in sexual activity. The patient  has past surgical history that includes Hernia repair.  Her family history includes Anemia in her mother; Arthritis in her maternal grandmother; Clotting disorder in her maternal grandfather; GER disease in her mother; Hypertension in her maternal grandfather, maternal grandmother, and paternal grandfather. There is no history of Other.  Review of Systems  Constitutional: Positive for fever and chills. Negative for diaphoresis.    HENT: Positive for congestion and sore throat.   Eyes:       Itching  Respiratory: Positive for cough. Negative for sputum production, shortness of breath and wheezing.   Cardiovascular: Negative.   Gastrointestinal: Negative.   Genitourinary: Negative.   Musculoskeletal: Positive for myalgias.  Skin: Negative.   Neurological: Positive for headaches. Negative for dizziness.  Endo/Heme/Allergies: Positive for environmental allergies.    OBJECTIVE  Her  height is 5' 8.5" (1.74 m) and weight is 169 lb 12.8 oz (77.021 kg). Her oral temperature is 98.3 F (36.8 C). Her blood pressure is 100/72 and her pulse is 103. Her respiration is 16 and oxygen saturation is 100%.  The patient's body mass index is 25.44 kg/(m^2). Heart rate checked by me at 90 bpm on auscultation.   Physical Exam  Constitutional: Stacey Key is oriented to person, place, and time. Stacey Key appears well-developed and well-nourished. No distress.  HENT:  Right Ear: Hearing, tympanic membrane, external ear and ear canal normal.  Left Ear: Hearing, tympanic membrane, external ear and ear canal normal.  Nose: Mucosal edema (bluish hue) present. No septal deviation. No epistaxis. Right sinus exhibits no maxillary sinus tenderness and no frontal sinus tenderness. Left sinus exhibits no maxillary sinus tenderness and no frontal sinus tenderness.  Mouth/Throat: Uvula is midline, oropharynx is clear and moist and mucous membranes are normal.  Cardiovascular: Normal rate, regular rhythm and normal heart sounds.   Respiratory: Effort normal and breath sounds normal. Stacey Key has no wheezes. Stacey Key has no rales.  Neurological: Stacey Key is alert and oriented to person, place, and time.  Skin: Skin is warm and dry. Stacey Key  is not diaphoretic.  Psychiatric: Stacey Key has a normal mood and affect.    Results for orders placed or performed in visit on 01/03/15 (from the past 24 hour(s))  POCT rapid strep A     Status: None   Collection Time: 01/03/15  4:27 PM  Result  Value Ref Range   Rapid Strep A Screen Negative Negative  POCT CBC     Status: Abnormal   Collection Time: 01/03/15  4:27 PM  Result Value Ref Range   WBC 5.9 4.6 - 10.2 K/uL   Lymph, poc 2.2 0.6 - 3.4   POC LYMPH PERCENT 36.7 10 - 50 %L   MID (cbc) 0.4 0 - 0.9   POC MID % 6.4 0 - 12 %M   POC Granulocyte 3.4 2 - 6.9   Granulocyte percent 56.9 37 - 80 %G   RBC 4.86 4.04 - 5.48 M/uL   Hemoglobin 13.1 12.2 - 16.2 g/dL   HCT, POC 41.6 37.7 - 47.9 %   MCV 85.7 80 - 97 fL   MCH, POC 27.0 27 - 31.2 pg   MCHC 31.6 (A) 31.8 - 35.4 g/dL   RDW, POC 13.3 %   Platelet Count, POC 294 142 - 424 K/uL   MPV 7.4 0 - 99.8 fL    ASSESSMENT & PLAN  Stacey Key was seen today for cough, sore throat and sinus problem.  Diagnoses and all orders for this visit:  Nasal congestion: 2/2 problem 3.  Orders: -     POCT CBC -     methylPREDNISolone acetate (DEPO-MEDROL) injection 80 mg; Inject 1 mL (80 mg total) into the muscle once.  Sore throat: Likely 2/2 post nasal drip and problem 3 Orders: -     POCT rapid strep A -     POCT CBC -     Culture, Group A Strep  Seasonal allergies: Advised that patient should take her flonase daily, and not symptomatically. Will treat her with the below and advised that this is not the normal treatment course for allergies, as repeated steroid administrations come with long term risks.  Advised via AVS that Stacey Key adjust her allergy treatment plan.  Orders: -     methylPREDNISolone acetate (DEPO-MEDROL) injection 80 mg; Inject 1 mL (80 mg total) into the muscle once.    The patient was advised to call or come back to clinic if Stacey Key does not see an improvement in symptoms, or worsens with the above plan.   Philis Fendt, MHS, PA-C [facility] 01/03/2015 4:42 PM

## 2015-01-04 NOTE — Progress Notes (Signed)
  Medical screening examination/treatment/procedure(s) were performed by non-physician practitioner and as supervising physician I was immediately available for consultation/collaboration.     

## 2015-01-05 LAB — CULTURE, GROUP A STREP: Organism ID, Bacteria: NORMAL

## 2015-02-08 ENCOUNTER — Ambulatory Visit (INDEPENDENT_AMBULATORY_CARE_PROVIDER_SITE_OTHER): Payer: BLUE CROSS/BLUE SHIELD | Admitting: *Deleted

## 2015-02-08 DIAGNOSIS — Z309 Encounter for contraceptive management, unspecified: Secondary | ICD-10-CM | POA: Diagnosis not present

## 2015-02-08 MED ORDER — MEDROXYPROGESTERONE ACETATE 150 MG/ML IM SUSP
150.0000 mg | Freq: Once | INTRAMUSCULAR | Status: AC
  Administered 2015-02-08: 150 mg via INTRAMUSCULAR

## 2015-10-06 ENCOUNTER — Ambulatory Visit (INDEPENDENT_AMBULATORY_CARE_PROVIDER_SITE_OTHER): Payer: BLUE CROSS/BLUE SHIELD

## 2015-10-06 ENCOUNTER — Ambulatory Visit (INDEPENDENT_AMBULATORY_CARE_PROVIDER_SITE_OTHER): Payer: BLUE CROSS/BLUE SHIELD | Admitting: Emergency Medicine

## 2015-10-06 VITALS — BP 108/68 | HR 76 | Temp 98.0°F | Resp 16 | Ht 68.75 in | Wt 164.0 lb

## 2015-10-06 DIAGNOSIS — S161XXA Strain of muscle, fascia and tendon at neck level, initial encounter: Secondary | ICD-10-CM

## 2015-10-06 DIAGNOSIS — S335XXA Sprain of ligaments of lumbar spine, initial encounter: Secondary | ICD-10-CM

## 2015-10-06 MED ORDER — CYCLOBENZAPRINE HCL 5 MG PO TABS: 5.0000 mg | ORAL_TABLET | Freq: Three times a day (TID) | ORAL | Status: DC | PRN

## 2015-10-06 MED ORDER — NAPROXEN SODIUM 550 MG PO TABS: 550.0000 mg | ORAL_TABLET | Freq: Two times a day (BID) | ORAL | Status: DC

## 2015-10-06 MED ORDER — HYDROCODONE-ACETAMINOPHEN 5-325 MG PO TABS: 1.0000 | ORAL_TABLET | ORAL | Status: DC | PRN

## 2015-10-06 NOTE — Patient Instructions (Signed)
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
## 2015-11-15 ENCOUNTER — Encounter: Payer: Self-pay | Admitting: Urgent Care

## 2015-11-15 ENCOUNTER — Ambulatory Visit (INDEPENDENT_AMBULATORY_CARE_PROVIDER_SITE_OTHER): Payer: BLUE CROSS/BLUE SHIELD | Admitting: Urgent Care

## 2015-11-15 VITALS — BP 120/76 | HR 89 | Temp 98.4°F | Resp 16 | Ht 69.0 in | Wt 159.0 lb

## 2015-11-15 DIAGNOSIS — D649 Anemia, unspecified: Secondary | ICD-10-CM

## 2015-11-15 DIAGNOSIS — N83299 Other ovarian cyst, unspecified side: Secondary | ICD-10-CM | POA: Diagnosis not present

## 2015-11-15 DIAGNOSIS — D251 Intramural leiomyoma of uterus: Secondary | ICD-10-CM | POA: Diagnosis not present

## 2015-11-15 DIAGNOSIS — R102 Pelvic and perineal pain: Secondary | ICD-10-CM

## 2015-11-15 DIAGNOSIS — N926 Irregular menstruation, unspecified: Secondary | ICD-10-CM | POA: Diagnosis not present

## 2015-11-15 LAB — POCT CBC
Granulocyte percent: 48.7 %G (ref 37–80)
HCT, POC: 38.4 % (ref 37.7–47.9)
Hemoglobin: 12.8 g/dL (ref 12.2–16.2)
LYMPH, POC: 2 (ref 0.6–3.4)
MCH, POC: 28.1 pg (ref 27–31.2)
MCHC: 33.4 g/dL (ref 31.8–35.4)
MCV: 84.2 fL (ref 80–97)
MID (CBC): 0.4 (ref 0–0.9)
MPV: 7.3 fL (ref 0–99.8)
PLATELET COUNT, POC: 264 10*3/uL (ref 142–424)
POC Granulocyte: 2.3 (ref 2–6.9)
POC LYMPH %: 42.1 % (ref 10–50)
POC MID %: 9.2 %M (ref 0–12)
RBC: 4.57 M/uL (ref 4.04–5.48)
RDW, POC: 13 %
WBC: 4.7 10*3/uL (ref 4.6–10.2)

## 2015-11-15 LAB — FERRITIN: Ferritin: 34 ng/mL (ref 10–154)

## 2015-11-15 MED ORDER — MEDROXYPROGESTERONE ACETATE 150 MG/ML IM SUSP
150.0000 mg | Freq: Once | INTRAMUSCULAR | Status: AC
  Administered 2015-11-15: 150 mg via INTRAMUSCULAR

## 2015-11-15 MED ORDER — NAPROXEN SODIUM 550 MG PO TABS: 550.0000 mg | ORAL_TABLET | Freq: Two times a day (BID) | ORAL | Status: DC

## 2015-11-15 NOTE — Progress Notes (Signed)
    MRN: [id number] DOB: 01-12-1986  Subjective:   Stacey Key is a 30 y.o. female presenting for chief complaint of depo shot  Reports that she would like to restart Depo injections. Patient was started on this several years ago to manage irregular cycles. She had originally got started on OCP but stopped this due to no improvement in her pelvic discomfort secondary to irregular cycles. Subsequent US showed that she had developed complex ovarian cyst, uterine fibroids. She had significant improvement with Depo shots. However, patient lost her insurance and was unable to pay for the shots herself. Since then, patient has started to have irregular cycles again, heavy bleeding, pelvic discomfort very similar to what she had before. She uses Alleve intermittently with some relief. Admits history of anemia, takes iron supplement inconsistently for this.  Madysun has a current medication list which includes the following Facility-Administered Medications: medroxyprogesterone. Also has No Known Allergies.  Thomasene  has a past medical history of No pertinent past medical history; Ovarian cyst; Allergy; Arthritis; Anemia; and Vitamin D deficiency. Also  has past surgical history that includes Hernia repair.  Objective:   Vitals: BP 120/76 mmHg  Pulse 89  Temp(Src) 98.4 F (36.9 C) (Oral)  Resp 16  Ht 5\' 9"  (1.753 m)  Wt 159 lb (72.122 kg)  BMI 23.47 kg/m2  SpO2 98%  Physical Exam  Constitutional: She is oriented to person, place, and time. She appears well-developed and well-nourished.  Cardiovascular: Normal rate, regular rhythm and intact distal pulses.  Exam reveals no gallop and no friction rub.   No murmur heard. Pulmonary/Chest: No respiratory distress. She has no wheezes. She has no rales.  Abdominal: Soft. Bowel sounds are normal. She exhibits no distension and no mass. There is tenderness (left pelvic).  Neurological: She is alert and oriented to person, place, and time.  Skin:  Skin is warm and dry.   Results for orders placed or performed in visit on 11/15/15 (from the past 24 hour(s))  POCT CBC     Status: None   Collection Time: 11/15/15 10:52 AM  Result Value Ref Range   WBC 4.7 4.6 - 10.2 K/uL   Lymph, poc 2.0 0.6 - 3.4   POC LYMPH PERCENT 42.1 10 - 50 %L   MID (cbc) 0.4 0 - 0.9   POC MID % 9.2 0 - 12 %M   POC Granulocyte 2.3 2 - 6.9   Granulocyte percent 48.7 37 - 80 %G   RBC 4.57 4.04 - 5.48 M/uL   Hemoglobin 12.8 12.2 - 16.2 g/dL   HCT, POC 38.4 37.7 - 47.9 %   MCV 84.2 80 - 97 fL   MCH, POC 28.1 27 - 31.2 pg   MCHC 33.4 31.8 - 35.4 g/dL   RDW, POC 13.0 %   Platelet Count, POC 264 142 - 424 K/uL   MPV 7.3 0 - 99.8 fL   Assessment and Plan :   1. Intramural leiomyoma of uterus 2. Complex ovarian cyst 3. Irregular menstrual cycle 4. Anemia, unspecified anemia type 5. Pelvic pain in female - Restart Depo. Pelvic US pending. Start Anaprox for pelvic pain which is likely due to her fibroids. Patient has an ob/gyn that she plans on seeing again for repeat of her pap smear. She denies having had abnormal pap before. RTC in 3 months for repeat Depo injection.  Jaynee Eagles, PA-C [facility] [phone] 11/15/2015 10:29 AM

## 2015-11-15 NOTE — Patient Instructions (Addendum)
Medroxyprogesterone injection [Contraceptive] What is this medicine? MEDROXYPROGESTERONE (me DROX ee proe JES te rone) contraceptive injections prevent pregnancy. They provide effective birth control for 3 months. Depo-subQ Provera 104 is also used for treating pain related to endometriosis. This medicine may be used for other purposes; ask your health care provider or pharmacist if you have questions. What should I tell my health care provider before I take this medicine? They need to know if you have any of these conditions: -frequently drink alcohol -asthma -blood vessel disease or a history of a blood clot in the lungs or legs -bone disease such as osteoporosis -breast cancer -diabetes -eating disorder (anorexia nervosa or bulimia) -high blood pressure -HIV infection or AIDS -kidney disease -liver disease -mental depression -migraine -seizures (convulsions) -stroke -tobacco smoker -vaginal bleeding -an unusual or allergic reaction to medroxyprogesterone, other hormones, medicines, foods, dyes, or preservatives -pregnant or trying to get pregnant -breast-feeding How should I use this medicine? Depo-Provera Contraceptive injection is given into a muscle. Depo-subQ Provera 104 injection is given under the skin. These injections are given by a health care professional. You must not be pregnant before getting an injection. The injection is usually given during the first 5 days after the start of a menstrual period or 6 weeks after delivery of a baby. Talk to your pediatrician regarding the use of this medicine in children. Special care may be needed. These injections have been used in female children who have started having menstrual periods. Overdosage: If you think you have taken too much of this medicine contact a poison control center or emergency room at once. NOTE: This medicine is only for you. Do not share this medicine with others. What if I miss a dose? Try not to miss a  dose. You must get an injection once every 3 months to maintain birth control. If you cannot keep an appointment, call and reschedule it. If you wait longer than 13 weeks between Depo-Provera contraceptive injections or longer than 14 weeks between Depo-subQ Provera 104 injections, you could get pregnant. Use another method for birth control if you miss your appointment. You may also need a pregnancy test before receiving another injection. What may interact with this medicine? Do not take this medicine with any of the following medications: -bosentan This medicine may also interact with the following medications: -aminoglutethimide -antibiotics or medicines for infections, especially rifampin, rifabutin, rifapentine, and griseofulvin -aprepitant -barbiturate medicines such as phenobarbital or primidone -bexarotene -carbamazepine -medicines for seizures like ethotoin, felbamate, oxcarbazepine, phenytoin, topiramate -modafinil -St. John's wort This list may not describe all possible interactions. Give your health care provider a list of all the medicines, herbs, non-prescription drugs, or dietary supplements you use. Also tell them if you smoke, drink alcohol, or use illegal drugs. Some items may interact with your medicine. What should I watch for while using this medicine? This drug does not protect you against HIV infection (AIDS) or other sexually transmitted diseases. Use of this product may cause you to lose calcium from your bones. Loss of calcium may cause weak bones (osteoporosis). Only use this product for more than 2 years if other forms of birth control are not right for you. The longer you use this product for birth control the more likely you will be at risk for weak bones. Ask your health care professional how you can keep strong bones. You may have a change in bleeding pattern or irregular periods. Many females stop having periods while taking this drug. If you have  received your  injections on time, your chance of being pregnant is very low. If you think you may be pregnant, see your health care professional as soon as possible. Tell your health care professional if you want to get pregnant within the next year. The effect of this medicine may last a long time after you get your last injection. What side effects may I notice from receiving this medicine? Side effects that you should report to your doctor or health care professional as soon as possible: -allergic reactions like skin rash, itching or hives, swelling of the face, lips, or tongue -breast tenderness or discharge -breathing problems -changes in vision -depression -feeling faint or lightheaded, falls -fever -pain in the abdomen, chest, groin, or leg -problems with balance, talking, walking -unusually weak or tired -yellowing of the eyes or skin Side effects that usually do not require medical attention (report to your doctor or health care professional if they continue or are bothersome): -acne -fluid retention and swelling -headache -irregular periods, spotting, or absent periods -temporary pain, itching, or skin reaction at site where injected -weight gain This list may not describe all possible side effects. Call your doctor for medical advice about side effects. You may report side effects to FDA at 1-800-FDA-1088. Where should I keep my medicine? This does not apply. The injection will be given to you by a health care professional. NOTE: This sheet is a summary. It may not cover all possible information. If you have questions about this medicine, talk to your doctor, pharmacist, or health care provider.    2016, Elsevier/Gold Standard. (2008-10-08 18:37:56)   Uterine Fibroids Uterine fibroids are tissue masses (tumors) that can develop in the womb (uterus). They are also called leiomyomas. This type of tumor is not cancerous (benign) and does not spread to other parts of the body outside of the  pelvic area, which is between the hip bones. Occasionally, fibroids may develop in the fallopian tubes, in the cervix, or on the support structures (ligaments) that surround the uterus. You can have one or many fibroids. Fibroids can vary in size, weight, and where they grow in the uterus. Some can become quite large. Most fibroids do not require medical treatment. CAUSES A fibroid can develop when a single uterine cell keeps growing (replicating). Most cells in the human body have a control mechanism that keeps them from replicating without control. SIGNS AND SYMPTOMS Symptoms may include:   Heavy bleeding during your period.  Bleeding or spotting between periods.  Pelvic pain and pressure.  Bladder problems, such as needing to urinate more often (urinary frequency) or urgently.  Inability to reproduce offspring (infertility).  Miscarriages. DIAGNOSIS Uterine fibroids are diagnosed through a physical exam. Your health care provider may feel the lumpy tumors during a pelvic exam. Ultrasonography and an MRI may be done to determine the size, location, and number of fibroids. TREATMENT Treatment may include:  Watchful waiting. This involves getting the fibroid checked by your health care provider to see if it grows or shrinks. Follow your health care provider's recommendations for how often to have this checked.  Hormone medicines. These can be taken by mouth or given through an intrauterine device (IUD).  Surgery.  Removing the fibroids (myomectomy) or the uterus (hysterectomy).  Removing blood supply to the fibroids (uterine artery embolization). If fibroids interfere with your fertility and you want to become pregnant, your health care provider may recommend having the fibroids removed.  HOME CARE INSTRUCTIONS  Keep all follow-up visits  as directed by your health care provider. This is important.  Take medicines only as directed by your health care provider.  If you were  prescribed a hormone treatment, take the hormone medicines exactly as directed.  Do not take aspirin, because it can cause bleeding.  Ask your health care provider about taking iron pills and increasing the amount of dark green, leafy vegetables in your diet. These actions can help to boost your blood iron levels, which may be affected by heavy menstrual bleeding.  Pay close attention to your period and tell your health care provider about any changes, such as:  Increased blood flow that requires you to use more pads or tampons than usual per month.  A change in the number of days that your period lasts per month.  A change in symptoms that are associated with your period, such as abdominal cramping or back pain. SEEK MEDICAL CARE IF:  You have pelvic pain, back pain, or abdominal cramps that cannot be controlled with medicines.  You have an increase in bleeding between and during periods.  You soak tampons or pads in a half hour or less.  You feel lightheaded, extra tired, or weak. SEEK IMMEDIATE MEDICAL CARE IF:  You faint.  You have a sudden increase in pelvic pain.   This information is not intended to replace advice given to you by your health care provider. Make sure you discuss any questions you have with your health care provider.   Document Released: 09/14/2000 Document Revised: 10/08/2014 Document Reviewed: 03/16/2014 Elsevier Interactive Patient Education [employer].

## 2015-11-16 ENCOUNTER — Telehealth: Payer: Self-pay

## 2015-11-16 DIAGNOSIS — D251 Intramural leiomyoma of uterus: Secondary | ICD-10-CM

## 2015-11-16 DIAGNOSIS — N83299 Other ovarian cyst, unspecified side: Secondary | ICD-10-CM

## 2015-11-16 NOTE — Telephone Encounter (Signed)
[facility] called stating that a transvaginal ultasound needs to be added to the original referral   Best number [id number]

## 2015-11-16 NOTE — Telephone Encounter (Signed)
I added the transvaginal US.

## 2015-11-21 ENCOUNTER — Ambulatory Visit (INDEPENDENT_AMBULATORY_CARE_PROVIDER_SITE_OTHER): Payer: BLUE CROSS/BLUE SHIELD | Admitting: Family Medicine

## 2015-11-21 VITALS — BP 107/70 | HR 76 | Temp 98.4°F | Resp 16 | Ht 69.0 in | Wt 159.0 lb

## 2015-11-21 DIAGNOSIS — N63 Unspecified lump in unspecified breast: Secondary | ICD-10-CM
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
## 2015-11-21 NOTE — Patient Instructions (Signed)
Breast Cyst A breast cyst is a sac in the breast that is filled with fluid. Breast cysts are common in women. Women can have one or many cysts. When the breasts contain many cysts, it is usually due to a noncancerous (benign) condition called fibrocystic change. These lumps form under the influence of female hormones (estrogen and progesterone). The lumps are most often located in the upper, outer portion of the breast. They are often more swollen, painful, and tender before your period starts. They usually disappear after menopause, unless you are on hormone therapy.  There are several types of cysts:  Macrocyst. This is a cyst that is about 2 in. (5.1 cm) in diameter.   Microcyst. This is a tiny cyst that you cannot feel but can be seen with a mammogram or an ultrasound.   Galactocele. This is a cyst containing milk that may develop if you suddenly stop breastfeeding.   Sebaceous cyst of the skin. This type of cyst is not in the breast tissue itself. Breast cysts do not increase your risk of breast cancer. However, they must be monitored closely because they can be cancerous.  CAUSES  It is not known exactly what causes a breast cyst to form. Possible causes include:  An overgrowth of milk glands and connective tissue in the breast can block the milk glands, causing them to fill with fluid.   Scar tissue in the breast from previous surgery may block the glands, causing a cyst.  RISK FACTORS Estrogen may influence the development of a breast cyst.  SIGNS AND SYMPTOMS   Feeling a smooth, round, soft lump (like a grape) in the breast that is easily moveable.   Breast discomfort or pain.  Increase in size of the lump before your menstrual period and decrease in its size after your menstrual period.  DIAGNOSIS  A cyst can be felt during a physical exam by your health care provider. A breast X-ray exam (mammogram) and ultrasonography will be done to confirm the diagnosis. Fluid may  be removed from the cyst with a needle (fine needle aspiration) to make sure the cyst is not cancerous.  TREATMENT  Treatment may not be necessary. Your health care provider may monitor the cyst to see if it goes away on its own. If treatment is needed, it may include:  Hormone treatment.   Needle aspiration. There is a chance of the cyst coming back after aspiration.   Surgery to remove the whole cyst.  HOME CARE INSTRUCTIONS   Keep all follow-up appointments with your health care provider.  See your health care provider regularly:  Get a yearly exam by your health care provider.  Have a clinical breast exam by a health care provider every 1-3 years if you are 20-40 years of age. After age 40 years, you should have the exam every year.   Get mammogram tests as directed by your health care provider.   Understand the normal appearance and feel of your breasts and perform breast self-exams.   Only take over-the-counter or prescription medicines as directed by your health care provider.   Wear a supportive bra, especially when exercising.   Avoid caffeine.   Reduce your salt intake, especially before your menstrual period. Too much salt can cause fluid retention, breast swelling, and discomfort.  SEEK MEDICAL CARE IF:   You feel, or think you feel, a lump in your breast.   You notice that both breasts look or feel different than usual.   Your   breast is still causing pain after your menstrual period is over.   You need medicine for breast pain and swelling that occurs with your menstrual period.  SEEK IMMEDIATE MEDICAL CARE IF:   You have severe pain, tenderness, redness, or warmth in your breast.   You have nipple discharge or bleeding.   Your breast lump becomes hard and painful.   You find new lumps or bumps that were not there before.   You feel lumps in your armpit (axilla).   You notice dimpling or wrinkling of the breast or nipple.   You  have a fever.  MAKE SURE YOU:  Understand these instructions.  Will watch your condition.  Will get help right away if you are not doing well or get worse.   This information is not intended to replace advice given to you by your health care provider. Make sure you discuss any questions you have with your health care provider.   Document Released: 09/17/2005 Document Revised: 05/20/2013 Document Reviewed: 04/16/2013 Elsevier Interactive Patient Education 2016 Elsevier Inc.  Fibrocystic Breast Changes Fibrocystic breast changes occur when breast ducts become blocked, causing painful, fluid-filled lumps (cysts) to form in the breast. This is a common condition that is noncancerous (benign). It occurs when women go through hormonal changes during their menstrual cycle. Fibrocystic breast changes can affect one or both breasts. CAUSES  The exact cause of fibrocystic breast changes is not known, but it may be related to the female hormones estrogen and progesterone. Family traits that get passed from parent to child (genetics) may also be a factor in some cases. SIGNS AND SYMPTOMS   Tenderness, mild discomfort, or pain.   Swelling.   Rope-like feeling when touching the breast.   Lumpy breast, one or both sides.   Changes in breast size, especially before (larger) and after (smaller) the menstrual period.   Green or dark brown nipple discharge (not blood).  Symptoms are usually worse before menstrual periods start and get better toward the end of the menstrual period.  DIAGNOSIS  To make a diagnosis, your health care provider will ask you questions and perform a physical exam of your breasts. The health care provider may recommend other tests that can examine inside your breasts, such as:  A breast X-ray (mammogram).   Ultrasonography.  An MRI.  If something more than fibrocystic breast changes is suspected, your health care provider may take a breast tissue sample  (breast biopsy) to examine. TREATMENT  Often, treatment is not needed. Your health care provider may recommend over-the-counter pain relievers to help lessen pain or discomfort caused by the fibrocystic breast changes. You may also be asked to change your diet to limit or stop eating foods or drinking beverages that contain caffeine. Foods and beverages that contain caffeine include chocolate, soda, coffee, and tea. Reducing sugar and fat in your diet may also help. Your health care provider may also recommend:  Fine needle aspiration to remove fluid from a cyst that is causing pain.   Surgery to remove a large, persistent, and tender cyst. HOME CARE INSTRUCTIONS   Examine your breasts after every menstrual period. If you do not have menstrual periods, check your breasts the first day of every month. Feel for changes, such as more tenderness, a new growth, a change in breast size, or a change in a lump that has always been there.   Only take over-the-counter or prescription medicine as directed by your health care provider.   Wear a  well-fitted support or sports bra, especially when exercising.   Decrease or avoid caffeine, fat, and sugar in your diet as directed by your health care provider.  SEEK MEDICAL CARE IF:   You have fluid leaking (discharge) from your nipples, especially bloody discharge.   You have new lumps or bumps in the breast.   Your breast or breasts become enlarged, red, and painful.   You have areas of your breast that pucker in.   Your nipples appear flat or indented.    This information is not intended to replace advice given to you by your health care provider. Make sure you discuss any questions you have with your health care provider.   Document Released: 07/04/2006 Document Revised: 06/08/2015 Document Reviewed: 03/08/2013 Elsevier Interactive Patient Education [employer].

## 2015-11-27 ENCOUNTER — Encounter: Payer: Self-pay | Admitting: Urgent Care

## 2015-11-29 ENCOUNTER — Ambulatory Visit
Admission: RE | Admit: 2015-11-29 | Discharge: 2015-11-29 | Disposition: A | Discharge status: Home / Self Care | Payer: BLUE CROSS/BLUE SHIELD | Source: Ambulatory Visit | Attending: Urgent Care | Admitting: Urgent Care

## 2015-11-29 DIAGNOSIS — N83299 Other ovarian cyst, unspecified side: Secondary | ICD-10-CM

## 2015-11-29 DIAGNOSIS — D251 Intramural leiomyoma of uterus: Secondary | ICD-10-CM

## 2015-12-08 ENCOUNTER — Ambulatory Visit
Admission: RE | Admit: 2015-12-08 | Discharge: 2015-12-08 | Disposition: A | Discharge status: Home / Self Care | Payer: BLUE CROSS/BLUE SHIELD | Source: Ambulatory Visit | Attending: Family Medicine | Admitting: Family Medicine

## 2015-12-08 DIAGNOSIS — N63 Unspecified lump in unspecified breast: Secondary | ICD-10-CM

## 2015-12-22 ENCOUNTER — Telehealth: Payer: Self-pay

## 2015-12-22 NOTE — Telephone Encounter (Signed)
Pt. Would like phone consultation regarding Breast ultrasound on 11/21/15   [phone] please call// VM okay// she will be working until 6:30 pm

## 2015-12-23 ENCOUNTER — Encounter: Payer: Self-pay | Admitting: *Deleted

## 2015-12-23 NOTE — Telephone Encounter (Signed)
Spoke with pt, advised ultrasound normal.

## 2015-12-26 ENCOUNTER — Ambulatory Visit (INDEPENDENT_AMBULATORY_CARE_PROVIDER_SITE_OTHER): Payer: BLUE CROSS/BLUE SHIELD | Admitting: Family Medicine

## 2015-12-26 VITALS — BP 126/80 | HR 116 | Temp 100.0°F | Resp 17 | Ht 68.5 in | Wt 165.0 lb

## 2015-12-26 DIAGNOSIS — J09X2 Influenza due to identified novel influenza A virus with other respiratory manifestations: Secondary | ICD-10-CM | POA: Diagnosis not present

## 2015-12-26 DIAGNOSIS — M791 Myalgia, unspecified site: Secondary | ICD-10-CM

## 2015-12-26 DIAGNOSIS — J029 Acute pharyngitis, unspecified: Secondary | ICD-10-CM

## 2015-12-26 DIAGNOSIS — R509 Fever, unspecified: Secondary | ICD-10-CM | POA: Diagnosis not present

## 2015-12-26 LAB — POCT INFLUENZA A/B
Influenza A, POC: POSITIVE — AB
Influenza B, POC: NEGATIVE

## 2015-12-26 MED ORDER — IBUPROFEN 200 MG PO TABS
200.0000 mg | ORAL_TABLET | Freq: Once | ORAL | Status: AC
  Administered 2015-12-26: 200 mg via ORAL

## 2015-12-26 MED ORDER — OSELTAMIVIR PHOSPHATE 75 MG PO CAPS: 75.0000 mg | ORAL_CAPSULE | Freq: Two times a day (BID) | ORAL | Status: DC

## 2015-12-26 NOTE — Patient Instructions (Signed)
For muscle aches, headache, sore throat you can take over the counter acetaminophen or ibuprofen as directed on the package For nasal congestion you can use Afrin nasal spray twice a day for up to 3 days, and /or sudafed, and/or saline nasal spray For cough you can use Delsym cough syrup Please come back to see [place] or go to the emergency department if you are not better in 5 to 7 days or if you develop fever over 101 for more than 48 hours or if you develop wheezing or shortness of breath.     Influenza, Adult Influenza ("the flu") is a viral infection of the respiratory tract. It occurs more often in winter months because people spend more time in close contact with one another. Influenza can make you feel very sick. Influenza easily spreads from person to person (contagious). CAUSES  Influenza is caused by a virus that infects the respiratory tract. You can catch the virus by breathing in droplets from an infected person's cough or sneeze. You can also catch the virus by touching something that was recently contaminated with the virus and then touching your mouth, nose, or eyes. RISKS AND COMPLICATIONS You may be at risk for a more severe case of influenza if you smoke cigarettes, have diabetes, have chronic heart disease (such as heart failure) or lung disease (such as asthma), or if you have a weakened immune system. Elderly people and pregnant women are also at risk for more serious infections. The most common problem of influenza is a lung infection (pneumonia). Sometimes, this problem can require emergency medical care and may be life threatening. SIGNS AND SYMPTOMS  Symptoms typically last 4 to 10 days and may include:  Fever.  Chills.  Headache, body aches, and muscle aches.  Sore throat.  Chest discomfort and cough.  Poor appetite.  Weakness or feeling tired.  Dizziness.  Nausea or vomiting. DIAGNOSIS  Diagnosis of influenza is often made based on your history and a  physical exam. A nose or throat swab test can be done to confirm the diagnosis. TREATMENT  In mild cases, influenza goes away on its own. Treatment is directed at relieving symptoms. For more severe cases, your health care provider may prescribe antiviral medicines to shorten the sickness. Antibiotic medicines are not effective because the infection is caused by a virus, not by bacteria. HOME CARE INSTRUCTIONS  Take medicines only as directed by your health care provider.  Use a cool mist humidifier to make breathing easier.  Get plenty of rest until your temperature returns to normal. This usually takes 3 to 4 days.  Drink enough fluid to keep your urine clear or pale yellow.  Cover yourmouth and nosewhen coughing or sneezing,and wash your handswellto prevent thevirusfrom spreading.  Stay homefromwork orschool untilthe fever is gonefor at least 40full day. PREVENTION  An annual influenza vaccination (flu shot) is the best way to avoid getting influenza. An annual flu shot is now routinely recommended for all adults in the [place] IF:  You experiencechest pain, yourcough worsens,or you producemore mucus.  Youhave nausea,vomiting, ordiarrhea.  Your fever returns or gets worse. SEEK IMMEDIATE MEDICAL CARE IF:  You havetrouble breathing, you become short of breath,or your skin ornails becomebluish.  You have severe painor stiffnessin the neck.  You develop a sudden headache, or pain in the face or ear.  You have nausea or vomiting that you cannot control. MAKE SURE YOU:   Understand these instructions.  Will watch your condition.  Will get help right away if you are not doing well or get worse.   This information is not intended to replace advice given to you by your health care provider. Make sure you discuss any questions you have with your health care provider.   Document Released: 09/14/2000 Document Revised: 10/08/2014 Document  Reviewed: 12/17/2011 Elsevier Interactive Patient Education [employer].

## 2015-12-26 NOTE — Progress Notes (Signed)
Subjective:    Patient ID: Stacey Key, female    DOB: [birth year], 30 y.o.   MRN: [id number]  HPI  This is a pleasant 30 yo female who presents today with 2 days of headache, sore throat, muscle aches. Has been taking tylenol with some relief. Temp. Max 102.4. Last tylenol about 9 hours ago.   Past Medical History  Diagnosis Date  . No pertinent past medical history   . Ovarian cyst   . Allergy   . Arthritis   . Anemia   . Vitamin D deficiency    Past Surgical History  Procedure Laterality Date  . Hernia repair     Family History  Problem Relation Age of Onset  . Other Neg Hx   . Anemia Mother   . GER disease Mother   . Arthritis Maternal Grandmother   . Hypertension Maternal Grandmother   . Hypertension Maternal Grandfather   . Clotting disorder Maternal Grandfather   . Hypertension Paternal Grandfather    Social History  Substance Use Topics  . Smoking status: Never Smoker   . Smokeless tobacco: Never Used  . Alcohol Use: No     Review of Systems  Constitutional: Positive for fever and fatigue.  HENT: Positive for rhinorrhea, sneezing and sore throat.   Respiratory: Positive for cough (minimally productive) and shortness of breath. Negative for wheezing.   Neurological: Positive for headaches.       Objective:   Physical Exam  Constitutional: She is oriented to person, place, and time. She appears well-developed and well-nourished. She appears ill. No distress.  HENT:  Head: Normocephalic and atraumatic.  Right Ear: External ear normal.  Left Ear: External ear normal.  Nose: Nose normal.  Mouth/Throat: Oropharynx is clear and moist.  Eyes: Conjunctivae are normal.  Neck: Normal range of motion. Neck supple.  Cardiovascular: Regular rhythm and normal heart sounds.  Tachycardia present.   Pulmonary/Chest: Effort normal and breath sounds normal.  Musculoskeletal: Normal range of motion.  Neurological: She is alert and oriented to person, place, and  time.  Skin: Skin is warm and dry. She is not diaphoretic.  Psychiatric: She has a normal mood and affect. Her behavior is normal. Judgment and thought content normal.  Vitals reviewed.     BP 126/80 mmHg  Pulse 116  Temp(Src) 100 F (37.8 C) (Oral)  Resp 17  Ht 5' 8.5" (1.74 m)  Wt 165 lb (74.844 kg)  BMI 24.72 kg/m2  SpO2 98%  Wt Readings from Last 3 Encounters:  12/26/15 165 lb (74.844 kg)  11/21/15 159 lb (72.122 kg)  11/15/15 159 lb (72.122 kg)  Recheck HR 100 Results for orders placed or performed in visit on 12/26/15  POCT Influenza A/B  Result Value Ref Range   Influenza A, POC Positive (A) Negative   Influenza B, POC Negative Negative       Assessment & Plan:  1. Fever, unspecified - POCT Influenza A/B - ibuprofen (ADVIL,MOTRIN) tablet 200 mg; Take 1 tablet (200 mg total) by mouth once.  2. Myalgia - POCT Influenza A/B - ibuprofen (ADVIL,MOTRIN) tablet 200 mg; Take 1 tablet (200 mg total) by mouth once.  3. Sore throat - POCT Influenza A/B - ibuprofen (ADVIL,MOTRIN) tablet 200 mg; Take 1 tablet (200 mg total) by mouth once.  4. Influenza due to identified novel influenza A virus with other respiratory manifestations - oseltamivir (TAMIFLU) 75 MG capsule; Take 1 capsule (75 mg total) by mouth 2 (two) times daily.  Dispense:  10 capsule; Refill: 0 -Provided written and verbal information regarding diagnosis and treatment. - RTC precautions reviewed  Clarene Reamer, FNP-BC  [facility]  12/26/2015 9:30 AM

## 2015-12-30 ENCOUNTER — Telehealth: Payer: Self-pay

## 2015-12-30 NOTE — Telephone Encounter (Signed)
Patient stated she is having shortness of breath and chest pain. I advised the patient to come in to be seen. Patient request for an inhaler to be called into the pharmacy. [facility] on [address] and [place]. [phone]

## 2015-12-31 NOTE — Telephone Encounter (Signed)
Attempted to call patient regarding symptoms. No answer. Left her a message to return to clinic if her symptoms are worse.

## 2016-04-20 DIAGNOSIS — Z30430 Encounter for insertion of intrauterine contraceptive device: Secondary | ICD-10-CM | POA: Insufficient documentation

## 2016-08-09 ENCOUNTER — Other Ambulatory Visit: Payer: Self-pay | Admitting: Obstetrics and Gynecology

## 2016-08-14 ENCOUNTER — Encounter (HOSPITAL_COMMUNITY)
Admission: RE | Admit: 2016-08-14 | Discharge: 2016-08-14 | Disposition: A | Discharge status: Home / Self Care | Payer: BLUE CROSS/BLUE SHIELD | Source: Ambulatory Visit | Attending: Obstetrics and Gynecology | Admitting: Obstetrics and Gynecology

## 2016-08-14 ENCOUNTER — Encounter (HOSPITAL_COMMUNITY): Payer: Self-pay

## 2016-08-14 DIAGNOSIS — Z01812 Encounter for preprocedural laboratory examination: Secondary | ICD-10-CM | POA: Diagnosis present

## 2016-08-14 HISTORY — DX: Gastro-esophageal reflux disease without esophagitis: K21.9

## 2016-08-14 LAB — CBC
HCT: 41 % (ref 36.0–46.0)
Hemoglobin: 13.4 g/dL (ref 12.0–15.0)
MCH: 28.4 pg (ref 26.0–34.0)
MCHC: 32.7 g/dL (ref 30.0–36.0)
MCV: 86.9 fL (ref 78.0–100.0)
PLATELETS: 293 10*3/uL (ref 150–400)
RBC: 4.72 MIL/uL (ref 3.87–5.11)
RDW: 12.8 % (ref 11.5–15.5)
WBC: 5.6 10*3/uL (ref 4.0–10.5)

## 2016-08-14 NOTE — Patient Instructions (Signed)
Your procedure is scheduled on:  Wednesday, Nov. 22, 2017  Enter through the [employer] [facility] at:  6:00 AM  Pick up the phone at the desk and dial [phone].  Call this number if you have problems the morning of surgery: [phone].  Remember: Do NOT eat food or drink after:  Midnight Tuesday, Nov. 21, 2017  Take these medicines the morning of surgery with a SIP OF WATER:  None  Stop ALL herbal medications at this time   Do NOT wear jewelry (body piercing), metal hair clips/bobby pins, make-up, or nail polish. Do NOT wear lotions, powders, or perfumes.  You may wear deodorant. Do NOT shave for 48 hours prior to surgery. Do NOT bring valuables to the hospital. Contacts, dentures, or bridgework may not be worn into surgery.  Have a responsible adult drive you home and stay with you for 24 hours after your procedure
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
## 2016-08-21 NOTE — Anesthesia Preprocedure Evaluation (Addendum)
Anesthesia Evaluation  Patient identified by MRN, date of birth, ID band Patient awake    Reviewed: Allergy & Precautions, NPO status , Patient's Chart, lab work & pertinent test results  History of Anesthesia Complications Negative for: history of anesthetic complications  Airway Mallampati: II  TM Distance: >3 FB Neck ROM: Full    Dental no notable dental hx. (+) Dental Advisory Given   Pulmonary neg pulmonary ROS,    Pulmonary exam normal        Cardiovascular negative cardio ROS Normal cardiovascular exam     Neuro/Psych negative neurological ROS  negative psych ROS   GI/Hepatic negative GI ROS, Neg liver ROS,   Endo/Other  negative endocrine ROS  Renal/GU negative Renal ROS  negative genitourinary   Musculoskeletal negative musculoskeletal ROS (+)   Abdominal   Peds negative pediatric ROS (+)  Hematology negative hematology ROS (+)   Anesthesia Other Findings   Reproductive/Obstetrics negative OB ROS                            Anesthesia Physical Anesthesia Plan  ASA: I  Anesthesia Plan: General   Post-op Pain Management:    Induction: Intravenous  Airway Management Planned: Oral ETT  Additional Equipment:   Intra-op Plan:   Post-operative Plan: Extubation in OR  Informed Consent: I have reviewed the patients History and Physical, chart, labs and discussed the procedure including the risks, benefits and alternatives for the proposed anesthesia with the patient or authorized representative who has indicated his/her understanding and acceptance.   Dental advisory given  Plan Discussed with: CRNA and Anesthesiologist  Anesthesia Plan Comments:        Anesthesia Quick Evaluation

## 2016-08-22 ENCOUNTER — Encounter (HOSPITAL_COMMUNITY)
Admission: RE | Disposition: A | Discharge status: Home / Self Care | Payer: Self-pay | Source: Ambulatory Visit | Attending: Obstetrics and Gynecology

## 2016-08-22 ENCOUNTER — Ambulatory Visit (HOSPITAL_COMMUNITY)
Admission: RE | Admit: 2016-08-22 | Discharge: 2016-08-22 | Disposition: A | Discharge status: Home / Self Care | Payer: BLUE CROSS/BLUE SHIELD | Source: Ambulatory Visit | Attending: Obstetrics and Gynecology | Admitting: Obstetrics and Gynecology

## 2016-08-22 ENCOUNTER — Ambulatory Visit (HOSPITAL_COMMUNITY): Payer: BLUE CROSS/BLUE SHIELD | Admitting: Anesthesiology

## 2016-08-22 ENCOUNTER — Encounter (HOSPITAL_COMMUNITY): Payer: Self-pay

## 2016-08-22 DIAGNOSIS — D251 Intramural leiomyoma of uterus: Secondary | ICD-10-CM

## 2016-08-22 DIAGNOSIS — R102 Pelvic and perineal pain unspecified side: Secondary | ICD-10-CM | POA: Diagnosis present

## 2016-08-22 DIAGNOSIS — D252 Subserosal leiomyoma of uterus: Secondary | ICD-10-CM

## 2016-08-22 DIAGNOSIS — Z793 Long term (current) use of hormonal contraceptives: Secondary | ICD-10-CM | POA: Diagnosis not present

## 2016-08-22 DIAGNOSIS — N946 Dysmenorrhea, unspecified: Secondary | ICD-10-CM

## 2016-08-22 DIAGNOSIS — D259 Leiomyoma of uterus, unspecified: Secondary | ICD-10-CM | POA: Insufficient documentation

## 2016-08-22 DIAGNOSIS — G8929 Other chronic pain: Secondary | ICD-10-CM

## 2016-08-22 HISTORY — DX: Pelvic and perineal pain: R10.2

## 2016-08-22 HISTORY — PX: LAPAROSCOPY: SHX197

## 2016-08-22 HISTORY — DX: Dysmenorrhea, unspecified: N94.6

## 2016-08-22 LAB — PREGNANCY, URINE: PREG TEST UR: NEGATIVE

## 2016-08-22 SURGERY — LAPAROSCOPY OPERATIVE
Anesthesia: General | Site: Abdomen

## 2016-08-22 MED ORDER — HYDROCODONE-ACETAMINOPHEN 5-325 MG PO TABS: ORAL_TABLET | ORAL | 0 refills | Status: DC

## 2016-08-22 MED ORDER — SUGAMMADEX SODIUM 200 MG/2ML IV SOLN
INTRAVENOUS | Status: DC | PRN
  Administered 2016-08-22: 200 mg via INTRAVENOUS

## 2016-08-22 MED ORDER — SUGAMMADEX SODIUM 200 MG/2ML IV SOLN
INTRAVENOUS | Status: AC
  Filled 2016-08-22: qty 2

## 2016-08-22 MED ORDER — FENTANYL CITRATE (PF) 250 MCG/5ML IJ SOLN
INTRAMUSCULAR | Status: AC
  Filled 2016-08-22: qty 5

## 2016-08-22 MED ORDER — SCOPOLAMINE 1 MG/3DAYS TD PT72
1.0000 | MEDICATED_PATCH | Freq: Once | TRANSDERMAL | Status: DC
  Administered 2016-08-22: 1.5 mg via TRANSDERMAL

## 2016-08-22 MED ORDER — HYDROMORPHONE HCL 1 MG/ML IJ SOLN
INTRAMUSCULAR | Status: DC
Start: 2016-08-22 — End: 2016-08-22
  Filled 2016-08-22: qty 1

## 2016-08-22 MED ORDER — SCOPOLAMINE 1 MG/3DAYS TD PT72
MEDICATED_PATCH | TRANSDERMAL | Status: AC
  Administered 2016-08-22: 1.5 mg via TRANSDERMAL
  Filled 2016-08-22: qty 1

## 2016-08-22 MED ORDER — HYDROCODONE-ACETAMINOPHEN 5-325 MG PO TABS
ORAL_TABLET | ORAL | Status: AC
  Administered 2016-08-22: 1 via ORAL
  Filled 2016-08-22: qty 1

## 2016-08-22 MED ORDER — EPHEDRINE SULFATE 50 MG/ML IJ SOLN
INTRAMUSCULAR | Status: DC | PRN
  Administered 2016-08-22: 10 mg via INTRAVENOUS

## 2016-08-22 MED ORDER — DEXAMETHASONE SODIUM PHOSPHATE 10 MG/ML IJ SOLN
INTRAMUSCULAR | Status: DC | PRN
  Administered 2016-08-22: 4 mg via INTRAVENOUS

## 2016-08-22 MED ORDER — EPHEDRINE 5 MG/ML INJ
INTRAVENOUS | Status: AC
Start: 2016-08-22 — End: 2016-08-22
  Filled 2016-08-22: qty 10

## 2016-08-22 MED ORDER — DEXAMETHASONE SODIUM PHOSPHATE 4 MG/ML IJ SOLN
INTRAMUSCULAR | Status: AC
  Filled 2016-08-22: qty 1

## 2016-08-22 MED ORDER — HYDROCODONE-ACETAMINOPHEN 5-325 MG PO TABS
1.0000 | ORAL_TABLET | Freq: Once | ORAL | Status: AC
  Administered 2016-08-22: 1 via ORAL

## 2016-08-22 MED ORDER — LACTATED RINGERS IV SOLN
INTRAVENOUS | Status: DC
  Administered 2016-08-22: 125 mL/h via INTRAVENOUS
  Administered 2016-08-22: 08:00:00 via INTRAVENOUS

## 2016-08-22 MED ORDER — MIDAZOLAM HCL 2 MG/2ML IJ SOLN
INTRAMUSCULAR | Status: AC
  Filled 2016-08-22: qty 2

## 2016-08-22 MED ORDER — ONDANSETRON HCL 4 MG/2ML IJ SOLN
INTRAMUSCULAR | Status: AC
  Filled 2016-08-22: qty 2

## 2016-08-22 MED ORDER — ONDANSETRON HCL 4 MG/2ML IJ SOLN
INTRAMUSCULAR | Status: DC | PRN
  Administered 2016-08-22: 4 mg via INTRAVENOUS

## 2016-08-22 MED ORDER — PROPOFOL 10 MG/ML IV BOLUS
INTRAVENOUS | Status: DC | PRN
  Administered 2016-08-22: 200 mg via INTRAVENOUS

## 2016-08-22 MED ORDER — PROMETHAZINE HCL 25 MG/ML IJ SOLN: 6.2500 mg | INTRAMUSCULAR | Status: DC | PRN

## 2016-08-22 MED ORDER — IBUPROFEN 600 MG PO TABS: ORAL_TABLET | ORAL | 0 refills | Status: DC

## 2016-08-22 MED ORDER — LIDOCAINE HCL (CARDIAC) 20 MG/ML IV SOLN
INTRAVENOUS | Status: DC | PRN
  Administered 2016-08-22: 50 mg via INTRAVENOUS

## 2016-08-22 MED ORDER — FENTANYL CITRATE (PF) 100 MCG/2ML IJ SOLN
INTRAMUSCULAR | Status: DC | PRN
  Administered 2016-08-22: 100 ug via INTRAVENOUS
  Administered 2016-08-22: 50 ug via INTRAVENOUS
  Administered 2016-08-22: 100 ug via INTRAVENOUS

## 2016-08-22 MED ORDER — MIDAZOLAM HCL 2 MG/2ML IJ SOLN
INTRAMUSCULAR | Status: DC | PRN
Start: 2016-08-22 — End: 2016-08-22
  Administered 2016-08-22: 2 mg via INTRAVENOUS

## 2016-08-22 MED ORDER — ROCURONIUM BROMIDE 100 MG/10ML IV SOLN
INTRAVENOUS | Status: DC | PRN
  Administered 2016-08-22: 50 mg via INTRAVENOUS

## 2016-08-22 MED ORDER — OXYCODONE-ACETAMINOPHEN 5-325 MG PO TABS
ORAL_TABLET | ORAL | Status: AC
  Filled 2016-08-22: qty 1

## 2016-08-22 MED ORDER — BUPIVACAINE HCL (PF) 0.25 % IJ SOLN
INTRAMUSCULAR | Status: DC | PRN
  Administered 2016-08-22 (×2): 10 mL
  Administered 2016-08-22: 6 mL

## 2016-08-22 MED ORDER — BUPIVACAINE HCL (PF) 0.25 % IJ SOLN
INTRAMUSCULAR | Status: AC
  Filled 2016-08-22: qty 30

## 2016-08-22 MED ORDER — KETOROLAC TROMETHAMINE 30 MG/ML IJ SOLN
INTRAMUSCULAR | Status: DC | PRN
  Administered 2016-08-22: 30 mg via INTRAMUSCULAR
  Administered 2016-08-22: 30 mg via INTRAVENOUS

## 2016-08-22 MED ORDER — HYDROMORPHONE HCL 1 MG/ML IJ SOLN
0.2500 mg | INTRAMUSCULAR | Status: DC | PRN
  Administered 2016-08-22 (×2): 0.5 mg via INTRAVENOUS

## 2016-08-22 MED ORDER — LIDOCAINE HCL (CARDIAC) 20 MG/ML IV SOLN
INTRAVENOUS | Status: AC
  Filled 2016-08-22: qty 5

## 2016-08-22 MED ORDER — PROPOFOL 10 MG/ML IV BOLUS
INTRAVENOUS | Status: AC
  Filled 2016-08-22: qty 20

## 2016-08-22 SURGICAL SUPPLY — 45 items
ADH SKN CLS APL DERMABOND .7 (GAUZE/BANDAGES/DRESSINGS) ×1
BAG SPEC RTRVL LRG 6X4 10 (ENDOMECHANICALS)
CABLE HIGH FREQUENCY MONO STRZ (ELECTRODE) ×2 IMPLANT
CLOSURE WOUND 1/4 X3 (GAUZE/BANDAGES/DRESSINGS)
CLOTH BEACON ORANGE TIMEOUT ST (SAFETY) ×3 IMPLANT
CONTAINER PREFILL 10% NBF 60ML (FORM) ×2 IMPLANT
DERMABOND ADVANCED (GAUZE/BANDAGES/DRESSINGS) ×2
DERMABOND ADVANCED .7 DNX12 (GAUZE/BANDAGES/DRESSINGS) IMPLANT
DILATOR CANAL MILEX (MISCELLANEOUS) ×2 IMPLANT
DRSG OPSITE POSTOP 3X4 (GAUZE/BANDAGES/DRESSINGS) ×2 IMPLANT
DRSG TELFA 3X8 NADH (GAUZE/BANDAGES/DRESSINGS) ×3 IMPLANT
DURAPREP 26ML APPLICATOR (WOUND CARE) ×3 IMPLANT
GLOVE BIOGEL PI IND STRL 7.0 (GLOVE) ×1 IMPLANT
GLOVE BIOGEL PI INDICATOR 7.0 (GLOVE) ×2
GLOVE SURG SS PI 6.5 STRL IVOR (GLOVE) ×6 IMPLANT
GOWN STRL REUS W/TWL LRG LVL3 (GOWN DISPOSABLE) ×9 IMPLANT
NS IRRIG 1000ML POUR BTL (IV SOLUTION) ×3 IMPLANT
PACK LAPAROSCOPY BASIN (CUSTOM PROCEDURE TRAY) ×3 IMPLANT
PACK TRENDGUARD 450 HYBRID PRO (MISCELLANEOUS) IMPLANT
PACK TRENDGUARD 600 HYBRD PROC (MISCELLANEOUS) IMPLANT
PAD DRESSING TELFA 3X8 NADH (GAUZE/BANDAGES/DRESSINGS) IMPLANT
POUCH SPECIMEN RETRIEVAL 10MM (ENDOMECHANICALS) IMPLANT
PROTECTOR NERVE ULNAR (MISCELLANEOUS) ×6 IMPLANT
SET IRRIG TUBING LAPAROSCOPIC (IRRIGATION / IRRIGATOR) IMPLANT
SHEARS HARMONIC ACE PLUS 36CM (ENDOMECHANICALS) IMPLANT
SLEEVE XCEL OPT CAN 5 100 (ENDOMECHANICALS) ×2 IMPLANT
STRIP CLOSURE SKIN 1/4X3 (GAUZE/BANDAGES/DRESSINGS) IMPLANT
SUT MNCRL AB 4-0 PS2 18 (SUTURE) IMPLANT
SUT VIC AB 0 CT1 27 (SUTURE) ×3
SUT VIC AB 0 CT1 27XBRD ANBCTR (SUTURE) IMPLANT
SUT VIC AB 3-0 PS2 18 (SUTURE) ×3
SUT VIC AB 3-0 PS2 18XBRD (SUTURE) IMPLANT
SUT VICRYL 0 ENDOLOOP (SUTURE) IMPLANT
SUT VICRYL 0 UR6 27IN ABS (SUTURE) ×6 IMPLANT
SYR 50ML LL SCALE MARK (SYRINGE) ×3 IMPLANT
TOWEL OR 17X24 6PK STRL BLUE (TOWEL DISPOSABLE) ×6 IMPLANT
TRAY FOLEY CATH SILVER 14FR (SET/KITS/TRAYS/PACK) ×3 IMPLANT
TRENDGUARD 450 HYBRID PRO PACK (MISCELLANEOUS) ×3
TRENDGUARD 600 HYBRID PROC PK (MISCELLANEOUS)
TROCAR BALL TOP DISP 5MM (ENDOMECHANICALS) IMPLANT
TROCAR OPTI TIP 5M 100M (ENDOMECHANICALS) ×2 IMPLANT
TROCAR XCEL DIL TIP R 11M (ENDOMECHANICALS) IMPLANT
TROCAR XCEL OPT SLVE 5M 100M (ENDOMECHANICALS) ×2 IMPLANT
WARMER LAPAROSCOPE (MISCELLANEOUS) ×3 IMPLANT
WATER STERILE IRR 1000ML POUR (IV SOLUTION) ×3 IMPLANT

## 2016-08-22 NOTE — H&P (Signed)
History and Physical Interval Note:   08/22/2016   7:33 AM   Stacey Key  has presented today for surgery, with the diagnosis of Pelvic Pain, Irregular Periods  The various methods of treatment have been discussed with the patient and family. After consideration of risks, benefits and other options for treatment, the patient has consented to  Procedure(s): LAPAROSCOPY OPERATIVE Excisions of Adhesions as a surgical intervention .  I have reviewed the patients' chart and labs.  Questions were answered to the patient's satisfaction.     Eldred Manges  MD

## 2016-08-22 NOTE — Transfer of Care (Signed)
Immediate Anesthesia Transfer of Care Note  Patient: Stacey Key  Procedure(s) Performed: Procedure(s): LAPAROSCOPY OPERATIVE,excision of posterior culdesac endometriosis (N/A)  Patient Location: PACU  Anesthesia Type:General  Level of Consciousness: awake and alert oriented  Airway & Oxygen Therapy: Patient Spontanous Breathing and Patient connected to nasal cannula oxygen  Post-op Assessment: Report given to RN and Post -op Vital signs reviewed and stable  Post vital signs: Reviewed and stable  Last Vitals:  Vitals:   08/22/16 0616  BP: 106/75  Pulse: 85  Resp: 20  Temp: 36.8 C    Last Pain:  Vitals:   08/22/16 0616  TempSrc: Oral  PainSc: 3       Patients Stated Pain Goal: 4 ([id number])  Complications: No apparent anesthesia complications

## 2016-08-22 NOTE — Anesthesia Procedure Notes (Signed)
Procedure Name: Intubation Date/Time: 08/22/2016 7:50 AM Performed by: Jonna Munro Pre-anesthesia Checklist: Patient identified, Emergency Drugs available, Suction available, Patient being monitored and Timeout performed Patient Re-evaluated:Patient Re-evaluated prior to inductionOxygen Delivery Method: Circle system utilized Preoxygenation: Pre-oxygenation with 100% oxygen Intubation Type: IV induction Laryngoscope Size: Mac and 3 Grade View: Grade I Tube type: Oral Tube size: 6.5 mm Number of attempts: 1 Placement Confirmation: ETT inserted through vocal cords under direct vision,  positive ETCO2 and breath sounds checked- equal and bilateral Secured at: 22 (22 at teeth) cm Tube secured with: Tape Dental Injury: Teeth and Oropharynx as per pre-operative assessment

## 2016-08-22 NOTE — Op Note (Signed)
Operative Laparoscopy Procedure Note  Indications: The patient is a 30 y.o. female with increasingly severe pelvic pain and dysmenorrhea  Pre-operative Diagnosis: Pelvic pain             Dysmenorrhea  Post-operative Diagnosis: Pelvic pain             Dysmenorrhea              Fibroids             R/o endometriosis.  Surgeon: Eldred Manges   Assistants: Earnstine Regal PA-C  Anesthesia: General endotracheal anesthesia  ASA Class: 2  Procedure Details  The patient was seen in the Holding Room. The risks, benefits, complications, treatment options, and expected outcomes were discussed with the patient. The possibilities of reaction to medication, pulmonary aspiration, perforation of viscus, bleeding, recurrent infection, the need for additional procedures, failure to diagnose a condition, and creating a complication requiring transfusion or operation were discussed with the patient. The patient concurred with the proposed plan, giving informed consent. The patient was taken to the Operating Room, identified as Jacqlyn Krauss and the procedure verified as Operative Laparoscopy. A Time Out was held and the above information confirmed.  After induction of general anesthesia, the patient was placed in modified dorsal lithotomy position where she was prepped, draped, and catheterized in the normal, sterile fashion.  The cervix was visualized and an intrauterine manipulator was placed.The Mirena IUD string was visible A 1 cm umbilical incision was then performed and with a combination of sharp and blunt dissection, the fascia was grasped and elevated, then incised.  The peritoneum was entered sharply and the [facility] canula place.  Holding sutures were placed in the fascia and used to secure the canula.  Pneumoperitoneum was established.  A suprapubic incision was made to the left of midline and a 55mm trocar placed under direct visulization. The above findings were noted. The area of the peritoneal  window in the posterior culdesac was excised and removed throught the umbilical port.    Following the procedure the umbilical sheath was removed after intra-abdominal carbon dioxide was expressed. The incision was closed with the two held sutures of 0-Vicryl in the figure of 8 fashion. Subcuticular sutures of 3-0 Monocryl was used to close the skin in the umbilicus.  Dermabond sealed both incisionsl. The intrauterine manipulator was then removed.  Instrument, sponge, and needle counts were correct prior to abdominal closure and at the conclusion of the case.   Findings: The anterior cul-de-sac and round ligaments no lesions The uterus small right anterio fundal myioma The adnexa no leions Cul-de-sac peritoneal window in the midline posteriosly between the uterosacral ligaments.  Estimated Blood Loss:  Minimal         Drains: none                Specimens:Peritoneal excisional bx          Complications:  None; patient tolerated the procedure well.         Disposition: PACU - hemodynamically stable.         Condition: stable SCDs were used throughout the case.

## 2016-08-22 NOTE — Discharge Instructions (Addendum)
Call [facility] @ [phone] if:  You have a temperature greater than or equal to 100.4 degrees Farenheit orally You have pain that is not made better by the pain medication given and taken as directed You have excessive bleeding or problems urinating  Take Colace (Docusate Sodium/Stool Softener) 100 mg 2-3 times daily while taking narcotic pain medicine to avoid constipation or until bowel movements are regular. Take Ibuprofen 600 mg with food every 6 hours for 3 days then as needed for pain  You may drive after 24 hours You may walk up steps  You may shower tomorrow You may resume a regular diet  Keep incisions clean and dry, remove honeycomb dressing on August 29, 2016 Do not lift over 15 pounds for 6 weeks Avoid anything in vagina  until after your post-operative visit.   Post Anesthesia Home Care Instructions  Activity: Get plenty of rest for the remainder of the day. A responsible adult should stay with you for 24 hours following the procedure.  For the next 24 hours, DO NOT: -Drive a car -Paediatric nurse -Drink alcoholic beverages -Take any medication unless instructed by your physician -Make any legal decisions or sign important papers.  Meals: Start with liquid foods such as gelatin or soup. Progress to regular foods as tolerated. Avoid greasy, spicy, heavy foods. If nausea and/or vomiting occur, drink only clear liquids until the nausea and/or vomiting subsides. Call your physician if vomiting continues.  Special Instructions/Symptoms: Your throat may feel dry or sore from the anesthesia or the breathing tube placed in your throat during surgery. If this causes discomfort, gargle with warm salt water. The discomfort should disappear within 24 hours.  If you had a scopolamine patch placed behind your ear for the management of post- operative nausea and/or vomiting:  1. The medication in the patch is effective for 72 hours, after which it should be  removed.  Wrap patch in a tissue and discard in the trash. Wash hands thoroughly with soap and water. 2. You may remove the patch earlier than 72 hours if you experience unpleasant side effects which may include dry mouth, dizziness or visual disturbances. 3. Avoid touching the patch. Wash your hands with soap and water after contact with the patch.    Post Anesthesia Home Care Instructions  NO IBUPROFEN PRODUCTS UNTIL: 3:15 PM TODAY  Activity: Get plenty of rest for the remainder of the day. A responsible adult should stay with you for 24 hours following the procedure.  For the next 24 hours, DO NOT: -Drive a car -Paediatric nurse -Drink alcoholic beverages -Take any medication unless instructed by your physician -Make any legal decisions or sign important papers.  Meals: Start with liquid foods such as gelatin or soup. Progress to regular foods as tolerated. Avoid greasy, spicy, heavy foods. If nausea and/or vomiting occur, drink only clear liquids until the nausea and/or vomiting subsides. Call your physician if vomiting continues.  Special Instructions/Symptoms: Your throat may feel dry or sore from the anesthesia or the breathing tube placed in your throat during surgery. If this causes discomfort, gargle with warm salt water. The discomfort should disappear within 24 hours.  If you had a scopolamine patch placed behind your ear for the management of post- operative nausea and/or vomiting:  1. The medication in the patch is effective for 72 hours, after which it should be removed.  Wrap patch in a tissue and discard in the trash. Wash hands thoroughly with soap and water. 2. You  may remove the patch earlier than 72 hours if you experience unpleasant side effects which may include dry mouth, dizziness or visual disturbances. 3. Avoid touching the patch. Wash your hands with soap and water after contact with the patch.

## 2016-08-22 NOTE — Anesthesia Postprocedure Evaluation (Signed)
Anesthesia Post Note  Patient: Stacey Key  Procedure(s) Performed: Procedure(s) (LRB): LAPAROSCOPY OPERATIVE,excision of posterior culdesac endometriosis (N/A)  Patient location during evaluation: PACU Anesthesia Type: General Level of consciousness: sedated Pain management: pain level controlled Vital Signs Assessment: post-procedure vital signs reviewed and stable Respiratory status: spontaneous breathing and respiratory function stable Cardiovascular status: stable Anesthetic complications: no     Last Vitals:  Vitals:   08/22/16 1100 08/22/16 1107  BP: 103/61   Pulse: 94 93  Resp: 15 16  Temp:  36.8 C    Last Pain:  Vitals:   08/22/16 1107  TempSrc: Oral  PainSc:    Pain Goal: Patients Stated Pain Goal: 4 (08/22/16 1015)               [place]

## 2016-08-27 ENCOUNTER — Encounter (HOSPITAL_COMMUNITY): Payer: Self-pay | Admitting: Obstetrics and Gynecology

## 2016-10-05 DIAGNOSIS — N809 Endometriosis, unspecified: Secondary | ICD-10-CM | POA: Insufficient documentation

## 2017-06-26 MED FILL — NORETHINDRONE 5 MG TABLET: 5 | 30 days supply | Qty: 90 | Fill #0

## 2017-07-12 ENCOUNTER — Encounter: Payer: Self-pay | Admitting: Family Medicine

## 2017-07-12 ENCOUNTER — Ambulatory Visit (INDEPENDENT_AMBULATORY_CARE_PROVIDER_SITE_OTHER): Payer: BLUE CROSS/BLUE SHIELD | Admitting: Family Medicine

## 2017-07-12 VITALS — BP 118/72 | HR 77 | Temp 98.5°F | Resp 18 | Ht 67.0 in | Wt 177.0 lb

## 2017-07-12 DIAGNOSIS — R05 Cough: Secondary | ICD-10-CM | POA: Diagnosis not present

## 2017-07-12 DIAGNOSIS — J302 Other seasonal allergic rhinitis: Secondary | ICD-10-CM | POA: Diagnosis not present

## 2017-07-12 DIAGNOSIS — R059 Cough, unspecified: Secondary | ICD-10-CM

## 2017-07-12 DIAGNOSIS — J4540 Moderate persistent asthma, uncomplicated: Secondary | ICD-10-CM | POA: Diagnosis not present

## 2017-07-12 MED ORDER — ALBUTEROL SULFATE HFA 108 (90 BASE) MCG/ACT IN AERS: 2.0000 | INHALATION_SPRAY | Freq: Four times a day (QID) | RESPIRATORY_TRACT | 6 refills | Status: DC | PRN

## 2017-07-12 MED ORDER — BECLOMETHASONE DIPROPIONATE 80 MCG/ACT IN AERS: 1.0000 | INHALATION_SPRAY | Freq: Two times a day (BID) | RESPIRATORY_TRACT | 2 refills | Status: DC

## 2017-07-12 MED ORDER — BECLOMETHASONE DIPROP HFA 80 MCG/ACT IN AERB: 1.0000 | INHALATION_SPRAY | Freq: Two times a day (BID) | RESPIRATORY_TRACT | 2 refills | Status: DC

## 2017-07-12 MED ORDER — FLUTICASONE PROPIONATE 50 MCG/ACT NA SUSP: 2.0000 | Freq: Every day | NASAL | 6 refills | Status: DC

## 2017-07-12 NOTE — Patient Instructions (Addendum)
IF you received an x-ray today, you will receive an invoice from [facility]. Please contact [facility] at [phone] with questions or concerns regarding your invoice.   IF you received labwork today, you will receive an invoice from [facility]. Please contact [facility] at [phone] with questions or concerns regarding your invoice.   Our billing staff will not be able to assist you with questions regarding bills from these companies.  You will be contacted with the lab results as soon as they are available. The fastest way to get your results is to activate your My Chart account. Instructions are located on the last page of this paperwork. If you have not heard from [place] regarding the results in 2 weeks, please contact this office.     How to Use a Metered Dose Inhaler A metered dose inhaler is a handheld device for taking medicine that must be breathed into the lungs (inhaled). The device can be used to deliver a variety of inhaled medicines, including:  Quick relief or rescue medicines, such as bronchodilators.  Controller medicines, such as corticosteroids.  The medicine is delivered by pushing down on a metal canister to release a preset amount of spray and medicine. Each device contains the amount of medicine that is needed for a preset number of uses (inhalations). Your health care provider may recommend that you use a spacer with your inhaler to help you take the medicine more effectively. A spacer is a plastic tube with a mouthpiece on one end and an opening that connects to the inhaler on the other end. A spacer holds the medicine in a tube for a short time, which allows you to inhale more medicine. What are the risks? If you do not use your inhaler correctly, medicine might not reach your lungs to help you breathe. Inhaler medicine can cause side effects, such as:  Mouth or throat infection.  Cough.  Hoarseness.  Headache.  Nausea and  vomiting.  Lung infection (pneumonia) in people who have a lung condition called COPD.  How to use a metered dose inhaler without a spacer 1. Remove the cap from the inhaler. 2. If you are using the inhaler for the first time, shake it for 5 seconds, turn it away from your face, then release 4 puffs into the air. This is called priming. 3. Shake the inhaler for 5 seconds. 4. Position the inhaler so the top of the canister faces up. 5. Put your index finger on the top of the medicine canister. Support the bottom of the inhaler with your thumb. 6. Breathe out normally and as completely as possible, away from the inhaler. 7. Either place the inhaler between your teeth and close your lips tightly around the mouthpiece, or hold the inhaler 1-2 inches (2.5-5 cm) away from your open mouth. Keep your tongue down out of the way. If you are unsure which technique to use, ask your health care provider. 8. Press the canister down with your index finger to release the medicine, then inhale deeply and slowly through your mouth (not your nose) until your lungs are completely filled. Inhaling should take 4-6 seconds. 9. Hold the medicine in your lungs for 5-10 seconds (10 seconds is best). This helps the medicine get into the small airways of your lungs. 10. With your lips in a tight circle (pursed), breathe out slowly. 11. Repeat steps 3-10 until you have taken the number of puffs that your health care provider directed. Wait about 1 minute  between puffs or as directed. 12. Put the cap on the inhaler. 13. If you are using a steroid inhaler, rinse your mouth with water, gargle, and spit out the water. Do not swallow the water. How to use a metered dose inhaler with a spacer 1. Remove the cap from the inhaler. 2. If you are using the inhaler for the first time, shake it for 5 seconds, turn it away from your face, then release 4 puffs into the air. This is called priming. 3. Shake the inhaler for 5  seconds. 4. Place the open end of the spacer onto the inhaler mouthpiece. 5. Position the inhaler so the top of the canister faces up and the spacer mouthpiece faces you. 6. Put your index finger on the top of the medicine canister. Support the bottom of the inhaler and the spacer with your thumb. 7. Breathe out normally and as completely as possible, away from the spacer. 8. Place the spacer between your teeth and close your lips tightly around it. Keep your tongue down out of the way. 9. Press the canister down with your index finger to release the medicine, then inhale deeply and slowly through your mouth (not your nose) until your lungs are completely filled. Inhaling should take 4-6 seconds. 10. Hold the medicine in your lungs for 5-10 seconds (10 seconds is best). This helps the medicine get into the small airways of your lungs. 11. With your lips in a tight circle (pursed), breathe out slowly. 12. Repeat steps 3-11 until you have taken the number of puffs that your health care provider directed. Wait about 1 minute between puffs or as directed. 13. Remove the spacer from the inhaler and put the cap on the inhaler. 14. If you are using a steroid inhaler, rinse your mouth with water, gargle, and spit out the water. Do not swallow the water. Follow these instructions at home:  Take your inhaled medicine only as told by your health care provider. Do not use the inhaler more than directed by your health care provider.  Keep all follow-up visits as told by your health care provider. This is important.  If your inhaler has a counter, you can check it to determine how full your inhaler is. If your inhaler does not have a counter, ask your health care provider when you will need to refill your inhaler and write the refill date on a calendar or on your inhaler canister. Note that you cannot know when an inhaler is empty by shaking it.  Follow directions on the package insert for care and cleaning of  your inhaler and spacer. Contact a health care provider if:  Symptoms are only partially relieved with your inhaler.  You are having trouble using your inhaler.  You have an increase in phlegm.  You have headaches. Get help right away if:  You feel little or no relief after using your inhaler.  You have dizziness.  You have a fast heart rate.  You have chills or a fever.  You have night sweats.  There is blood in your phlegm. Summary  A metered dose inhaler is a handheld device for taking medicine that must be breathed into the lungs (inhaled).  The medicine is delivered by pushing down on a metal canister to release a preset amount of spray and medicine.  Each device contains the amount of medicine that is needed for a preset number of uses (inhalations). This information is not intended to replace advice given to you  by your health care provider. Make sure you discuss any questions you have with your health care provider. Document Released: 09/17/2005 Document Revised: 08/07/2016 Document Reviewed: 08/07/2016 Elsevier Interactive Patient Education  2017 [employer].

## 2017-07-12 NOTE — Progress Notes (Signed)
10/12/20183:11 PM  Stacey Key [birth year], 31 y.o. female [id number]  Chief Complaint  Patient presents with  . Cough    chest congestion x3weeks   . Migraine    over right eye   . Sore Throat    HPI:   Patient is a 31 y.o. 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 She denies any history of asthma, but does have seasonal allergies and dry skin. Several family members have asthma. She does not smoke.  She also then for the past week has been having chills, itchy ears and throat, sneezing, Had a migraine 2 days ago.   She is not taking any medications for any of her symptoms.   Depression screen [facility] 2/9 07/12/2017 12/26/2015 11/21/2015  Decreased Interest 0 0 0  Down, Depressed, Hopeless 0 0 0  PHQ - 2 Score 0 0 0    No Known Allergies  Prior to Admission medications   Medication Sig Start Date End Date Taking? Authorizing Provider  levonorgestrel (MIRENA) 20 MCG/24HR IUD 1 each by Intrauterine route once.   Yes [provider]  medroxyPROGESTERone (PROVERA) 10 MG tablet Take 30 mg by mouth daily. 06/01/16  Yes [provider]    Past Medical History:  Diagnosis Date  . Allergy   . Anemia   . Arthritis    shoulders, knee, hand  . GERD (gastroesophageal reflux disease)   . No pertinent past medical history   . Ovarian cyst   . Vitamin D deficiency     Past Surgical History:  Procedure Laterality Date  . HERNIA REPAIR    . LAPAROSCOPY N/A 08/22/2016   Procedure: LAPAROSCOPY OPERATIVE,excision of posterior culdesac endometriosis;  Surgeon: Eldred Manges, MD;  Location: [facility];  Service: Gynecology;  Laterality: N/A;  . WISDOM TOOTH EXTRACTION      Social History  Substance Use Topics  . Smoking status: Never Smoker  . Smokeless tobacco: Never Used  . Alcohol use No    Family History  Problem Relation Age of Onset    . Anemia Mother   . GER disease Mother   . Arthritis Maternal Grandmother   . Hypertension Maternal Grandmother   . Hypertension Maternal Grandfather   . Clotting disorder Maternal Grandfather   . Hypertension Paternal Grandfather   . Other Neg Hx     Review of Systems  Constitutional: Negative for chills, fever and malaise/fatigue.  HENT: Negative for congestion, ear discharge, ear pain, sinus pain and sore throat.   Respiratory: Positive for cough, shortness of breath and wheezing. Negative for hemoptysis and sputum production.   Cardiovascular: Negative for chest pain and palpitations.  Gastrointestinal: Negative for abdominal pain, constipation, diarrhea, nausea and vomiting.     OBJECTIVE:  Blood pressure 118/72, pulse 77, temperature 98.5 F (36.9 C), temperature source Oral, resp. rate 18, height 5\' 7"  (1.702 m), weight 177 lb (80.3 kg), last menstrual period 06/28/2017, SpO2 97 %.  Physical Exam  Constitutional: She is oriented to person, place, and time and well-developed, well-nourished, and in no distress.  HENT:  Head: Normocephalic and atraumatic.  Right Ear: Hearing, tympanic membrane, external ear and ear canal normal.  Left Ear: Hearing, tympanic membrane, external ear and ear canal normal.  Mouth/Throat: Oropharynx is clear and moist.  Eyes: Pupils are equal, round, and reactive to light. EOM are normal.  Neck: Neck supple.  Cardiovascular:  Normal rate, regular rhythm and normal heart sounds.  Exam reveals no gallop and no friction rub.   No murmur heard. Pulmonary/Chest: Effort normal and breath sounds normal. She has no wheezes. She has no rales.  Musculoskeletal: She exhibits no edema.  Lymphadenopathy:    She has no cervical adenopathy.  Neurological: She is alert and oriented to person, place, and time. Gait normal.  Skin: Skin is warm and dry.  Psychiatric: Mood and affect normal.  Nursing note and vitals reviewed.  Spirometry done today: FVC:  3.19, 89% pred FEV1: 2.05, 76% pred FVC/FEV1: 64.1, 75% pred PEF: 233.7, 53% pred FEF25-75: 1.33, 38%  System interpretation: Mod obstruction  ASSESSMENT and PLAN 1. Moderate persistent asthma, unspecified whether complicated Discussed with patient new diagnosis, importance between maintenance and rescue inhalers, symptoms control, ER precautions. New meds r/se/b discussed.  albuterol (PROVENTIL HFA;VENTOLIN HFA) 108 (90 Base) MCG/ACT inhaler; Inhale 2 puffs into the lungs every 6 (six) hours as needed for wheezing or shortness of breath. - beclomethasone (QVAR REDIHALER) 80 MCG/ACT inhaler; Inhale 1 puff into the lungs 2 (two) times daily.  2. Cough - Spirometry: Pre & Post Eval  3. Seasonal allergic rhinitis, unspecified trigger - fluticasone (FLONASE) 50 MCG/ACT nasal spray; Place 2 sprays into both nostrils daily.   Return in about 4 weeks (around 08/09/2017).    Rutherford Guys, MD Primary Care at [place], [place] [address] Ph.  [phone] Fax [phone]

## 2017-08-12 MED FILL — NORETHINDRONE 5 MG TABLET: 5 | 30 days supply | Qty: 90 | Fill #1

## 2017-08-16 ENCOUNTER — Ambulatory Visit: Payer: BLUE CROSS/BLUE SHIELD | Admitting: Family Medicine

## 2017-08-16 ENCOUNTER — Encounter: Payer: Self-pay | Admitting: Family Medicine

## 2017-08-16 ENCOUNTER — Other Ambulatory Visit: Payer: Self-pay

## 2017-08-16 VITALS — BP 116/72 | HR 113 | Temp 98.2°F | Resp 16 | Ht 70.47 in | Wt 176.0 lb

## 2017-08-16 DIAGNOSIS — J302 Other seasonal allergic rhinitis: Secondary | ICD-10-CM

## 2017-08-16 DIAGNOSIS — B370 Candidal stomatitis: Secondary | ICD-10-CM

## 2017-08-16 DIAGNOSIS — J4540 Moderate persistent asthma, uncomplicated: Secondary | ICD-10-CM | POA: Diagnosis not present

## 2017-08-16 HISTORY — DX: Other seasonal allergic rhinitis: J30.2

## 2017-08-16 MED ORDER — BECLOMETHASONE DIPROP HFA 80 MCG/ACT IN AERB: 2.0000 | INHALATION_SPRAY | Freq: Two times a day (BID) | RESPIRATORY_TRACT | 2 refills | Status: DC

## 2017-08-16 MED ORDER — MONTELUKAST SODIUM 10 MG PO TABS: 10.0000 mg | ORAL_TABLET | Freq: Every day | ORAL | 5 refills | Status: DC

## 2017-08-16 MED ORDER — NYSTATIN 100000 UNIT/ML MT SUSP: 5.0000 mL | Freq: Four times a day (QID) | OROMUCOSAL | 1 refills | Status: DC

## 2017-08-16 NOTE — Patient Instructions (Signed)
     IF you received an x-ray today, you will receive an invoice from [facility]. Please contact [facility] at [phone] with questions or concerns regarding your invoice.   IF you received labwork today, you will receive an invoice from [facility]. Please contact [facility] at [phone] with questions or concerns regarding your invoice.   Our billing staff will not be able to assist you with questions regarding bills from these companies.  You will be contacted with the lab results as soon as they are available. The fastest way to get your results is to activate your My Chart account. Instructions are located on the last page of this paperwork. If you have not heard from us regarding the results in 2 weeks, please contact this office.     

## 2017-08-16 NOTE — Progress Notes (Signed)
11/16/201810:51 AM  Stacey Key March 02, 1986, 31 y.o. female [id number]  Chief Complaint  Patient presents with  . Asthma    1 month follow-up pt states her cough has been better but she does still have some shob.   . Sore Throat    x 1 week     HPI:   Patient is a 31 y.o. female with past medical history significant for asthma recently started on low dose ICS who presents today for followup.  She states that she feels better, cough much improved. 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 Allergies also improved. She however continues to have sneezing and PND. She also reports that she got white coating on her tongue and a sore throat after changing her mouthwash. She states white coating is gone but throat is still sore. She does rinse after each use. She continues to use albuterol several times a week.  She has no acute concerns today.  Depression screen [facility] 2/9 08/16/2017 07/12/2017 12/26/2015  Decreased Interest 0 0 0  Down, Depressed, Hopeless 0 0 0  PHQ - 2 Score 0 0 0    No Known Allergies  Prior to Admission medications   Medication Sig Start Date End Date Taking? Authorizing Provider  albuterol (PROVENTIL HFA;VENTOLIN HFA) 108 (90 Base) MCG/ACT inhaler Inhale 2 puffs into the lungs every 6 (six) hours as needed for wheezing or shortness of breath. 07/12/17  Yes Rutherford Guys, MD  beclomethasone (QVAR REDIHALER) 80 MCG/ACT inhaler Inhale 1 puff into the lungs 2 (two) times daily. 07/12/17  Yes Rutherford Guys, MD  fluticasone (FLONASE) 50 MCG/ACT nasal spray Place 2 sprays into both nostrils daily. 07/12/17  Yes Rutherford Guys, MD  levonorgestrel (MIRENA) 20 MCG/24HR IUD 1 each by Intrauterine route once.   Yes [provider]  medroxyPROGESTERone (PROVERA) 10 MG tablet Take 30 mg by mouth daily. 06/01/16  Yes [provider]    Past Medical History:  Diagnosis Date  . Allergy   .  Anemia   . Arthritis    shoulders, knee, hand  . GERD (gastroesophageal reflux disease)   . No pertinent past medical history   . Ovarian cyst   . Vitamin D deficiency     Past Surgical History:  Procedure Laterality Date  . HERNIA REPAIR    . LAPAROSCOPY OPERATIVE,excision of posterior culdesac endometriosis N/A 08/22/2016   Performed by Eldred Manges, MD at [facility]  . WISDOM TOOTH EXTRACTION      Social History   Tobacco Use  . Smoking status: Never Smoker  . Smokeless tobacco: Never Used  Substance Use Topics  . Alcohol use: No    Family History  Problem Relation Age of Onset  . Anemia Mother   . GER disease Mother   . Arthritis Maternal Grandmother   . Hypertension Maternal Grandmother   . Hypertension Maternal Grandfather   . Clotting disorder Maternal Grandfather   . Hypertension Paternal Grandfather   . Other Neg Hx     Review of Systems  Constitutional: Negative for chills and fever.  Respiratory: Positive for cough and shortness of breath. Negative for wheezing.   Cardiovascular: Negative for chest pain, palpitations and leg swelling.  Gastrointestinal: Negative for abdominal pain, nausea and vomiting.    OBJECTIVE:  Blood pressure 116/72, pulse (!) 113, temperature 98.2 F (36.8 C), temperature source Oral, resp. rate 16, height 5' 10.47" (1.79 m),  weight 176 lb (79.8 kg), SpO2 100 %.  Physical Exam  Constitutional: She is oriented to person, place, and time and well-developed, well-nourished, and in no distress.  HENT:  Head: Normocephalic and atraumatic.  Mouth/Throat: Oropharynx is clear and moist. No oropharyngeal exudate.  Eyes: EOM are normal. Pupils are equal, round, and reactive to light. No scleral icterus.  Neck: Neck supple.  Cardiovascular: Normal rate, regular rhythm and normal heart sounds. Exam reveals no gallop and no friction rub.  No murmur heard. Pulmonary/Chest: Effort normal and breath sounds normal. She has no wheezes. She  has no rales.  Musculoskeletal: She exhibits no edema.  Neurological: She is alert and oriented to person, place, and time. Gait normal.  Skin: Skin is warm and dry.    ASSESSMENT and PLAN 1. Moderate persistent asthma, unspecified whether complicated Patient still symptomatic, increasing ICS to moderate dose, adding Singulair given allergies also not well controlled, reviewed use of ICS and albuterol. Might need to add LABA if not controlled at next visit. RTC precautions reviewed. New medications r/se/b reviewed.    2. Seasonal allergic rhinitis, unspecified trigger See above  3. Candida, oral None seen today but patient reports continued pain after seeing white plaques. Discussed new med r/se/b. Reviewed proper use of ICS.   Other orders - beclomethasone (QVAR REDIHALER) 80 MCG/ACT inhaler; Inhale 2 puffs 2 (two) times daily into the lungs. - montelukast (SINGULAIR) 10 MG tablet; Take 1 tablet (10 mg total) at bedtime by mouth. - nystatin (MYCOSTATIN) 100000 UNIT/ML suspension; Take 5 mLs (500,000 Units total) 4 (four) times daily by mouth.  Return in about 4 weeks (around 09/13/2017).    Rutherford Guys, MD Primary Care at [facility] [place], [place] [address] Ph.  [phone] Fax [phone]

## 2017-09-09 ENCOUNTER — Encounter: Payer: Self-pay | Admitting: Family Medicine

## 2017-09-13 ENCOUNTER — Ambulatory Visit: Payer: BLUE CROSS/BLUE SHIELD | Admitting: Family Medicine

## 2017-09-19 MED FILL — NORETHINDRONE 5 MG TABLET: 5 | 30 days supply | Qty: 90 | Fill #2

## 2017-09-26 ENCOUNTER — Encounter: Payer: Self-pay | Admitting: Family Medicine

## 2017-09-26 ENCOUNTER — Other Ambulatory Visit: Payer: Self-pay

## 2017-09-26 ENCOUNTER — Ambulatory Visit: Payer: BLUE CROSS/BLUE SHIELD | Admitting: Family Medicine

## 2017-09-26 ENCOUNTER — Ambulatory Visit (INDEPENDENT_AMBULATORY_CARE_PROVIDER_SITE_OTHER): Payer: BLUE CROSS/BLUE SHIELD

## 2017-09-26 VITALS — BP 108/78 | Temp 98.3°F | Ht 69.69 in | Wt 178.0 lb

## 2017-09-26 DIAGNOSIS — J4540 Moderate persistent asthma, uncomplicated: Secondary | ICD-10-CM

## 2017-09-26 DIAGNOSIS — R05 Cough: Secondary | ICD-10-CM

## 2017-09-26 DIAGNOSIS — R059 Cough, unspecified: Secondary | ICD-10-CM

## 2017-09-26 MED ORDER — BENZONATATE 100 MG PO CAPS: 100.0000 mg | ORAL_CAPSULE | Freq: Two times a day (BID) | ORAL | 0 refills | Status: DC | PRN

## 2017-09-26 NOTE — Patient Instructions (Signed)
     IF you received an x-ray today, you will receive an invoice from [facility]. Please contact [facility] at [phone] with questions or concerns regarding your invoice.   IF you received labwork today, you will receive an invoice from [facility]. Please contact [facility] at [phone] with questions or concerns regarding your invoice.   Our billing staff will not be able to assist you with questions regarding bills from these companies.  You will be contacted with the lab results as soon as they are available. The fastest way to get your results is to activate your My Chart account. Instructions are located on the last page of this paperwork. If you have not heard from us regarding the results in 2 weeks, please contact this office.     

## 2017-09-26 NOTE — Progress Notes (Signed)
12/27/201812:07 PM  Stacey Key 1986/09/22, 31 y.o. female [id number]  Chief Complaint  Patient presents with  . URI  . Medication Refill    ASTHMA NEDS    HPI:   Patient is a 31 y.o. female with past medical history significant for asthma and allergic rhinitis who presents today for follow up.  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 She is also using meds better, concerns for thrush resolved.   She however has had a cold for about a week, with a productive cough, pain in between her shoulder blades and chills. Has been doing dayquil/nyquil with some relief.  Depression screen [facility] 2/9 09/26/2017 08/16/2017 07/12/2017  Decreased Interest 0 0 0  Down, Depressed, Hopeless 0 0 0  PHQ - 2 Score 0 0 0    No Known Allergies  Prior to Admission medications   Medication Sig Start Date End Date Taking? Authorizing Provider  albuterol (PROVENTIL HFA;VENTOLIN HFA) 108 (90 Base) MCG/ACT inhaler Inhale 2 puffs into the lungs every 6 (six) hours as needed for wheezing or shortness of breath. 07/12/17   Rutherford Guys, MD  beclomethasone (QVAR REDIHALER) 80 MCG/ACT inhaler Inhale 2 puffs 2 (two) times daily into the lungs. 08/16/17   Rutherford Guys, MD  fluticasone (FLONASE) 50 MCG/ACT nasal spray Place 2 sprays into both nostrils daily. 07/12/17   Rutherford Guys, MD  levonorgestrel (MIRENA) 20 MCG/24HR IUD 1 each by Intrauterine route once.    [provider]  medroxyPROGESTERone (PROVERA) 10 MG tablet Take 30 mg by mouth daily. 06/01/16   [provider]  montelukast (SINGULAIR) 10 MG tablet Take 1 tablet (10 mg total) at bedtime by mouth. 08/16/17   Rutherford Guys, MD  nystatin (MYCOSTATIN) 100000 UNIT/ML suspension Take 5 mLs (500,000 Units total) 4 (four) times daily by mouth. 08/16/17   Rutherford Guys, MD    Past Medical History:    Diagnosis Date  . Allergy   . Anemia   . Arthritis    shoulders, knee, hand  . GERD (gastroesophageal reflux disease)   . No pertinent past medical history   . Ovarian cyst   . Vitamin D deficiency     Past Surgical History:  Procedure Laterality Date  . HERNIA REPAIR    . LAPAROSCOPY N/A 08/22/2016   Procedure: LAPAROSCOPY OPERATIVE,excision of posterior culdesac endometriosis;  Surgeon: Eldred Manges, MD;  Location: [facility];  Service: Gynecology;  Laterality: N/A;  . WISDOM TOOTH EXTRACTION      Social History   Tobacco Use  . Smoking status: Never Smoker  . Smokeless tobacco: Never Used  Substance Use Topics  . Alcohol use: No    Family History  Problem Relation Age of Onset  . Anemia Mother   . GER disease Mother   . Arthritis Maternal Grandmother   . Hypertension Maternal Grandmother   . Hypertension Maternal Grandfather   . Clotting disorder Maternal Grandfather   . Hypertension Paternal Grandfather   . Other Neg Hx     ROS Per hpi  OBJECTIVE:  Blood pressure 108/78, temperature 98.3 F (36.8 C), temperature source Oral, height 5' 9.69" (1.77 m), weight 178 lb (80.7 kg), SpO2 95 %.  Physical Exam  Constitutional: She is oriented to person, place, and time and well-developed, well-nourished, and in no distress.  HENT:  Head: Normocephalic and atraumatic.  Right Ear: Hearing, tympanic membrane, external ear and ear canal normal.  Left Ear: Hearing, tympanic membrane, external ear and ear canal normal.  Mouth/Throat: Oropharynx is clear and moist.  Eyes: EOM are normal. Pupils are equal, round, and reactive to light.  Neck: Neck supple.  Cardiovascular: Normal rate, regular rhythm and normal heart sounds. Exam reveals no gallop and no friction rub.  No murmur heard. Pulmonary/Chest: Effort normal and breath sounds normal. She has no wheezes. She has no rales.  Lymphadenopathy:    She has no cervical adenopathy.  Neurological: She is alert and  oriented to person, place, and time. Gait normal.  Skin: Skin is warm and dry.     No results found for this or any previous visit (from the past 24 hour(s)).  Dg Chest 2 View  Result Date: 09/26/2017 CLINICAL DATA:  Productive cough with chills EXAM: CHEST  2 VIEW COMPARISON:  10/14/2013 FINDINGS: The heart size and mediastinal contours are within normal limits. Both lungs are clear. The visualized skeletal structures are unremarkable. IMPRESSION: No active cardiopulmonary disease. Electronically Signed   By: Franchot Gallo M.D.   On: 09/26/2017 12:50     ASSESSMENT and PLAN 1. Cough Discussed supportive measures, new meds and RTC precautions.  - DG Chest 2 View; Future - benzonatate (TESSALON) 100 MG capsule; Take 1 capsule (100 mg total) by mouth 2 (two) times daily as needed for cough.  2. Moderate persistent asthma without complication Asthma controlled, cont current regime.  Return in about 6 months (around 03/27/2018) for asthma.    Rutherford Guys, MD Primary Care at [place], [place] [address] Ph.  [phone] Fax [phone]

## 2017-10-22 MED FILL — NORETHINDRONE 5 MG TABLET: 5 | 30 days supply | Qty: 90 | Fill #3

## 2017-11-09 ENCOUNTER — Ambulatory Visit: Payer: BLUE CROSS/BLUE SHIELD | Admitting: Physician Assistant

## 2017-11-15 ENCOUNTER — Other Ambulatory Visit: Payer: Self-pay

## 2017-11-15 ENCOUNTER — Encounter: Payer: Self-pay | Admitting: Family Medicine

## 2017-11-15 ENCOUNTER — Ambulatory Visit: Payer: BLUE CROSS/BLUE SHIELD | Admitting: Family Medicine

## 2017-11-15 VITALS — BP 108/64 | HR 112 | Temp 98.9°F | Ht 68.0 in | Wt 184.4 lb

## 2017-11-15 DIAGNOSIS — M26622 Arthralgia of left temporomandibular joint: Secondary | ICD-10-CM

## 2017-11-15 DIAGNOSIS — K2900 Acute gastritis without bleeding: Secondary | ICD-10-CM

## 2017-11-15 DIAGNOSIS — K5900 Constipation, unspecified: Secondary | ICD-10-CM | POA: Diagnosis not present

## 2017-11-15 DIAGNOSIS — M65331 Trigger finger, right middle finger: Secondary | ICD-10-CM | POA: Diagnosis not present

## 2017-11-15 MED ORDER — RANITIDINE HCL 150 MG PO TABS: 150.0000 mg | ORAL_TABLET | Freq: Two times a day (BID) | ORAL | 0 refills | Status: DC

## 2017-11-15 MED ORDER — MELOXICAM 7.5 MG PO TABS: 7.5000 mg | ORAL_TABLET | Freq: Every day | ORAL | 1 refills | Status: DC

## 2017-11-15 MED ORDER — CYCLOBENZAPRINE HCL 5 MG PO TABS: 10.0000 mg | ORAL_TABLET | Freq: Three times a day (TID) | ORAL | 0 refills | Status: DC | PRN

## 2017-11-15 NOTE — Patient Instructions (Addendum)
1. Stop diclofenac, lets try meloxicam instead 2. Schedule back with ortho for right trigger finger, might be able to inject now 3. Trial of green smoothies for constipation, if not resolved after a week, add OTC miralax as discussed 4. followup with gyn for endometriosos 5. Try adding back mouth guard at night, no chewing gum, soft foods to help with jaw rest. Add ice pack for 10-20 minuest to area 2-3 times a day    IF you received an x-ray today, you will receive an invoice from [facility]. Please contact [facility] at [phone] with questions or concerns regarding your invoice.   IF you received labwork today, you will receive an invoice from [facility]. Please contact [facility] at [phone] with questions or concerns regarding your invoice.   Our billing staff will not be able to assist you with questions regarding bills from these companies.  You will be contacted with the lab results as soon as they are available. The fastest way to get your results is to activate your My Chart account. Instructions are located on the last page of this paperwork. If you have not heard from [place] regarding the results in 2 weeks, please contact this office.    Temporomandibular Joint Syndrome Temporomandibular joint (TMJ) syndrome is a condition that affects the joints between your jaw and your skull. The TMJs are located near your ears and allow your jaw to open and close. These joints and the nearby muscles are involved in all movements of the jaw. People with TMJ syndrome have pain in the area of these joints and muscles. Chewing, biting, or other movements of the jaw can be difficult or painful. TMJ syndrome can be caused by various things. In many cases, the condition is mild and goes away within a few weeks. For some people, the condition can become a long-term problem. What are the causes? Possible causes of TMJ syndrome include:  Grinding your teeth or clenching your jaw. Some  people do this when they are under stress.  Arthritis.  Injury to the jaw.  Head or neck injury.  Teeth or dentures that are not aligned well.  In some cases, the cause of TMJ syndrome may not be known. What are the signs or symptoms? The most common symptom is an aching pain on the side of the head in the area of the TMJ. Other symptoms may include:  Pain when moving your jaw, such as when chewing or biting.  Being unable to open your jaw all the way.  Making a clicking sound when you open your mouth.  Headache.  Earache.  Neck or shoulder pain.  How is this diagnosed? Diagnosis can usually be made based on your symptoms, your medical history, and a physical exam. Your health care provider may check the range of motion of your jaw. Imaging tests, such as X-rays or an MRI, are sometimes done. You may need to see your dentist to determine if your teeth and jaw are lined up correctly. How is this treated? TMJ syndrome often goes away on its own. If treatment is needed, the options may include:  Eating soft foods and applying ice or heat.  Medicines to relieve pain or inflammation.  Medicines to relax the muscles.  A splint, bite plate, or mouthpiece to prevent teeth grinding or jaw clenching.  Relaxation techniques or counseling to help reduce stress.  Transcutaneous electrical nerve stimulation (TENS). This helps to relieve pain by applying an electrical current through the skin.  Acupuncture.  This is sometimes helpful to relieve pain.  Jaw surgery. This is rarely needed.  Follow these instructions at home:  Take medicines only as directed by your health care provider.  Eat a soft diet if you are having trouble chewing.  Apply ice to the painful area. ? Put ice in a plastic bag. ? Place a towel between your skin and the bag. ? Leave the ice on for 20 minutes, 2-3 times a day.  Apply a warm compress to the painful area as directed.  Massage your jaw area and  perform any jaw stretching exercises as recommended by your health care provider.  If you were given a mouthpiece or bite plate, wear it as directed.  Avoid foods that require a lot of chewing. Do not chew gum.  Keep all follow-up visits as directed by your health care provider. This is important. Contact a health care provider if:  You are having trouble eating.  You have new or worsening symptoms. Get help right away if:  Your jaw locks open or closed. This information is not intended to replace advice given to you by your health care provider. Make sure you discuss any questions you have with your health care provider. Document Released: 06/12/2001 Document Revised: 05/17/2016 Document Reviewed: 04/22/2014 Elsevier Interactive Patient Education  [employer].

## 2017-11-15 NOTE — Progress Notes (Signed)
2/15/20198:46 AM  Stacey Key 11-May-1986, 32 y.o. female [id number]  Chief Complaint  Patient presents with  . Pain    JAW PAIN ON THE LEFT SIDE. HAS BEEN HURTING FOR 2 WKS, TROUBLE CHEWING. MAKE POPPING NOISES WHEN YAWNING. CAUSING DIZINESS    HPI:   Patient is a 32 y.o. female who presents today for several concerns:  1. Left sided jaw pain, swelling, worse with chewing, yawning, grinds teeth, normally wear a mouth guard, had to stop recently as it was causing more pain. Chews gum constantly. Denies any ear pain or teeth pain.  2. Has a right 3rd trigger finger, was completely stuck last week, saw by ortho, at that time unable to do an injection, has been splinting it, was given diclofenac which has cause a constant sour feeling in her stomach with nausea. Has been taking peptobismol with minimal relief. Denies any vomiting, black tarry stools  3. Having problems with constipation, has endometriosis near her rectum. Currently not controlled with mirena and progesterone PO, still bleeding. Has upcoming gyn appt.   Depression screen [facility] 2/9 11/15/2017 09/26/2017 08/16/2017  Decreased Interest 0 0 0  Down, Depressed, Hopeless 0 0 0  PHQ - 2 Score 0 0 0    No Known Allergies  Prior to Admission medications   Medication Sig Start Date End Date Taking? Authorizing Provider  albuterol (PROVENTIL HFA;VENTOLIN HFA) 108 (90 Base) MCG/ACT inhaler Inhale 2 puffs into the lungs every 6 (six) hours as needed for wheezing or shortness of breath. 07/12/17  Yes Rutherford Guys, MD  beclomethasone (QVAR REDIHALER) 80 MCG/ACT inhaler Inhale 2 puffs 2 (two) times daily into the lungs. 08/16/17  Yes Rutherford Guys, MD  fluticasone (FLONASE) 50 MCG/ACT nasal spray Place 2 sprays into both nostrils daily. 07/12/17  Yes Rutherford Guys, MD  levonorgestrel (MIRENA) 20 MCG/24HR IUD 1 each by Intrauterine route once.   Yes [provider]  medroxyPROGESTERone (PROVERA) 10 MG tablet Take  30 mg by mouth daily. 06/01/16  Yes [provider]  montelukast (SINGULAIR) 10 MG tablet Take 1 tablet (10 mg total) at bedtime by mouth. 08/16/17  Yes Rutherford Guys, MD  benzonatate (TESSALON) 100 MG capsule Take 1 capsule (100 mg total) by mouth 2 (two) times daily as needed for cough. Patient not taking: Reported on 11/15/2017 09/26/17   Rutherford Guys, MD  nystatin (MYCOSTATIN) 100000 UNIT/ML suspension Take 5 mLs (500,000 Units total) 4 (four) times daily by mouth. Patient not taking: Reported on 11/15/2017 08/16/17   Rutherford Guys, MD    Past Medical History:  Diagnosis Date  . Allergy   . Anemia   . Arthritis    shoulders, knee, hand  . Asthma   . Endometriosis   . GERD (gastroesophageal reflux disease)   . Ovarian cyst   . Vitamin D deficiency     Past Surgical History:  Procedure Laterality Date  . HERNIA REPAIR    . LAPAROSCOPY N/A 08/22/2016   Procedure: LAPAROSCOPY OPERATIVE,excision of posterior culdesac endometriosis;  Surgeon: Eldred Manges, MD;  Location: [facility];  Service: Gynecology;  Laterality: N/A;  . WISDOM TOOTH EXTRACTION      Social History   Tobacco Use  . Smoking status: Never Smoker  . Smokeless tobacco: Never Used  Substance Use Topics  . Alcohol use: No    Family History  Problem Relation Age of Onset  . Anemia Mother   . GER disease Mother   .  Arthritis Maternal Grandmother   . Hypertension Maternal Grandmother   . Hypertension Maternal Grandfather   . Clotting disorder Maternal Grandfather   . Hypertension Paternal Grandfather   . Other Neg Hx     Review of Systems  Constitutional: Negative for chills and fever.  Respiratory: Negative for cough and shortness of breath.   Cardiovascular: Negative for chest pain, palpitations and leg swelling.  Gastrointestinal: Positive for abdominal pain, constipation, heartburn and nausea. Negative for blood in stool, melena and vomiting.     OBJECTIVE:  Blood pressure  108/64, pulse (!) 112, temperature 98.9 F (37.2 C), temperature source Oral, height 5\' 8"  (1.727 m), weight 184 lb 6.4 oz (83.6 kg), SpO2 100 %.  Physical Exam  Constitutional: She is oriented to person, place, and time and well-developed, well-nourished, and in no distress.  HENT:  Head: Normocephalic and atraumatic.  Right Ear: Hearing, tympanic membrane, external ear and ear canal normal.  Left Ear: Hearing, tympanic membrane, external ear and ear canal normal.  Mouth/Throat: Oropharynx is clear and moist. No dental abscesses. No posterior oropharyngeal edema or posterior oropharyngeal erythema.  TTP of Right TMJ, no crepitus  Eyes: EOM are normal. Pupils are equal, round, and reactive to light.  Neck: Neck supple.  Cardiovascular: Normal rate, regular rhythm and normal heart sounds. Exam reveals no gallop and no friction rub.  No murmur heard. Pulmonary/Chest: Effort normal and breath sounds normal. She has no wheezes. She has no rales.  Abdominal: Soft. Bowel sounds are normal. She exhibits no distension. There is tenderness (epigasrtic and LLQ). There is no rebound and no guarding.  Lymphadenopathy:    She has no cervical adenopathy.  Neurological: She is alert and oriented to person, place, and time. Gait normal.  Skin: Skin is warm and dry.    ASSESSMENT and PLAN  1. Arthralgia of left temporomandibular joint Discussed supportive measures, new meds r/se/b and RTC precautions. Patient educational handout given. Trial of meloxicam instead of diclofenac  2. Acute gastritis without hemorrhage, unspecified gastritis type Seems to be related to diclofenac, short course of ranitidine  3. Constipation, unspecified constipation type Discussed dietary changes, start OTC miralax if still having issues after a week  4. Trigger middle finger of right hand followup with ortho  Other orders - meloxicam (MOBIC) 7.5 MG tablet; Take 1-2 tablets (7.5-15 mg total) by mouth daily. -  cyclobenzaprine (FLEXERIL) 5 MG tablet; Take 2 tablets (10 mg total) by mouth 3 (three) times daily as needed for muscle spasms. - ranitidine (ZANTAC) 150 MG tablet; Take 1 tablet (150 mg total) by mouth 2 (two) times daily.  Return in about 4 weeks (around 12/13/2017).    Rutherford Guys, MD Primary Care at [facility], [place] [address] Ph.  [phone] Fax [phone]

## 2017-11-19 ENCOUNTER — Telehealth: Payer: Self-pay | Admitting: Family Medicine

## 2017-11-19 NOTE — Telephone Encounter (Signed)
Called and left pt a VM asking her to call the office and reschedule her appt for 12/13/17 to a different day with Dr. Pamella Pert.  When pt calls back in, please have her reschedule for a different day at her convenience.  Thanks!

## 2017-11-26 MED FILL — NORETHINDRONE 5 MG TABLET: 5 | 30 days supply | Qty: 90 | Fill #0

## 2017-12-05 ENCOUNTER — Other Ambulatory Visit: Payer: Self-pay

## 2017-12-05 ENCOUNTER — Ambulatory Visit: Payer: BLUE CROSS/BLUE SHIELD | Admitting: Family Medicine

## 2017-12-05 ENCOUNTER — Encounter: Payer: Self-pay | Admitting: Family Medicine

## 2017-12-05 VITALS — BP 108/82 | HR 98 | Temp 98.8°F | Ht 69.29 in | Wt 188.4 lb

## 2017-12-05 DIAGNOSIS — R059 Cough, unspecified: Secondary | ICD-10-CM

## 2017-12-05 DIAGNOSIS — R05 Cough: Secondary | ICD-10-CM

## 2017-12-05 DIAGNOSIS — J029 Acute pharyngitis, unspecified: Secondary | ICD-10-CM

## 2017-12-05 DIAGNOSIS — J309 Allergic rhinitis, unspecified: Secondary | ICD-10-CM | POA: Diagnosis not present

## 2017-12-05 LAB — POCT RAPID STREP A (OFFICE): Rapid Strep A Screen: NEGATIVE

## 2017-12-05 MED ORDER — BENZONATATE 100 MG PO CAPS: 100.0000 mg | ORAL_CAPSULE | Freq: Three times a day (TID) | ORAL | 0 refills | Status: DC | PRN

## 2017-12-05 NOTE — Progress Notes (Signed)
3/7/20192:33 PM  Stacey Key 1986/05/02, 32 y.o. female [id number]  Chief Complaint  Patient presents with  . Sore Throat    Having flu like symptoms since Sunday.     HPI:   Patient is a 32 y.o. female with past medical history significant for asthma who presents today for 3 days of sore throat,headache and subjective fevers. Yesterday started having sneezing, itchy eyes and ears, nasal congestion, PND and cough. Has been taking OTC cold medication. Feels asthma is stable, denies SOB or wheezing. Denies any nausea, vomiting, abd pain, diarrhea or rashes. She has not been taking her allergy medications as she normally gets sx in the spring.   Depression screen [facility] 2/9 12/05/2017 11/15/2017 09/26/2017  Decreased Interest 0 0 0  Down, Depressed, Hopeless 0 0 0  PHQ - 2 Score 0 0 0    No Known Allergies  Prior to Admission medications   Medication Sig Start Date End Date Taking? Authorizing Provider  albuterol (PROVENTIL HFA;VENTOLIN HFA) 108 (90 Base) MCG/ACT inhaler Inhale 2 puffs into the lungs every 6 (six) hours as needed for wheezing or shortness of breath. 07/12/17  Yes Rutherford Guys, MD  beclomethasone (QVAR REDIHALER) 80 MCG/ACT inhaler Inhale 2 puffs 2 (two) times daily into the lungs. 08/16/17  Yes Rutherford Guys, MD  cyclobenzaprine (FLEXERIL) 5 MG tablet Take 2 tablets (10 mg total) by mouth 3 (three) times daily as needed for muscle spasms. 11/15/17  Yes Rutherford Guys, MD  fluticasone [facility]) 50 MCG/ACT nasal spray Place 2 sprays into both nostrils daily. 07/12/17  Yes Rutherford Guys, MD  levonorgestrel (MIRENA) 20 MCG/24HR IUD 1 each by Intrauterine route once.   Yes [provider]  medroxyPROGESTERone (PROVERA) 10 MG tablet Take 30 mg by mouth daily. 06/01/16  Yes [provider]  meloxicam (MOBIC) 7.5 MG tablet Take 1-2 tablets (7.5-15 mg total) by mouth daily. 11/15/17  Yes Rutherford Guys, MD  montelukast (SINGULAIR) 10 MG tablet Take 1  tablet (10 mg total) at bedtime by mouth. 08/16/17  Yes Rutherford Guys, MD  nystatin (MYCOSTATIN) 100000 UNIT/ML suspension Take 5 mLs (500,000 Units total) 4 (four) times daily by mouth. 08/16/17  Yes Rutherford Guys, MD  ranitidine (ZANTAC) 150 MG tablet Take 1 tablet (150 mg total) by mouth 2 (two) times daily. 11/15/17  Yes Rutherford Guys, MD  benzonatate (TESSALON) 100 MG capsule Take 1 capsule (100 mg total) by mouth 2 (two) times daily as needed for cough. Patient not taking: Reported on 12/05/2017 09/26/17   Rutherford Guys, MD    Past Medical History:  Diagnosis Date  . Allergy   . Anemia   . Arthritis    shoulders, knee, hand  . Asthma   . Endometriosis   . GERD (gastroesophageal reflux disease)   . Ovarian cyst   . Vitamin D deficiency     Past Surgical History:  Procedure Laterality Date  . HERNIA REPAIR    . LAPAROSCOPY N/A 08/22/2016   Procedure: LAPAROSCOPY OPERATIVE,excision of posterior culdesac endometriosis;  Surgeon: Eldred Manges, MD;  Location: [facility];  Service: Gynecology;  Laterality: N/A;  . WISDOM TOOTH EXTRACTION      Social History   Tobacco Use  . Smoking status: Never Smoker  . Smokeless tobacco: Never Used  Substance Use Topics  . Alcohol use: No    Family History  Problem Relation Age of Onset  . Anemia Mother   . GER disease  Mother   . Arthritis Maternal Grandmother   . Hypertension Maternal Grandmother   . Hypertension Maternal Grandfather   . Clotting disorder Maternal Grandfather   . Hypertension Paternal Grandfather   . Other Neg Hx     ROS Per hpi  OBJECTIVE:  Blood pressure 108/82, pulse 98, temperature 98.8 F (37.1 C), temperature source Oral, height 5' 9.29" (1.76 m), weight 188 lb 6.4 oz (85.5 kg), SpO2 100 %.  Physical Exam  Constitutional: She is oriented to person, place, and time and well-developed, well-nourished, and in no distress.  HENT:  Head: Normocephalic and atraumatic.  Right Ear: Hearing,  tympanic membrane, external ear and ear canal normal.  Left Ear: Hearing, tympanic membrane, external ear and ear canal normal.  Mouth/Throat: Oropharynx is clear and moist.  Eyes: EOM are normal. Pupils are equal, round, and reactive to light.  Neck: Neck supple.  Cardiovascular: Normal rate, regular rhythm and normal heart sounds. Exam reveals no gallop and no friction rub.  No murmur heard. Pulmonary/Chest: Effort normal and breath sounds normal. She has no wheezes. She has no rales.  Lymphadenopathy:    She has no cervical adenopathy.  Neurological: She is alert and oriented to person, place, and time. Gait normal.  Skin: Skin is warm and dry.    Results for orders placed or performed in visit on 12/05/17 (from the past 24 hour(s))  POCT rapid strep A     Status: Normal   Collection Time: 12/05/17  2:42 PM  Result Value Ref Range   Rapid Strep A Screen Negative Negative    ASSESSMENT and PLAN 1. Allergic rhinitis, unspecified seasonality, unspecified trigger Discussed restarting her allergy meds, supportive measures, RTC precautions - POCT rapid strep A  2. Sore throat  3. Cough  Other orders - benzonatate (TESSALON) 100 MG capsule; Take 1-2 capsules (100-200 mg total) by mouth 3 (three) times daily as needed for cough.  Return if symptoms worsen or fail to improve.    Rutherford Guys, MD Primary Care at [facility], [place] [address] Ph.  [phone] Fax [phone]

## 2017-12-05 NOTE — Patient Instructions (Addendum)
1. Start flonase 1 spray per nostril twice a day 2. Start oral antihistamine such as generic claritin, zyrtec, allegra, xyzal 3. Start using humidifier at night    IF you received an x-ray today, you will receive an invoice from [facility]. Please contact [facility] at [phone] with questions or concerns regarding your invoice.   IF you received labwork today, you will receive an invoice from [facility]. Please contact [facility] at [phone] with questions or concerns regarding your invoice.   Our billing staff will not be able to assist you with questions regarding bills from these companies.  You will be contacted with the lab results as soon as they are available. The fastest way to get your results is to activate your My Chart account. Instructions are located on the last page of this paperwork. If you have not heard from [place] regarding the results in 2 weeks, please contact this office.

## 2017-12-12 ENCOUNTER — Other Ambulatory Visit: Payer: Self-pay | Admitting: Family Medicine

## 2017-12-13 ENCOUNTER — Ambulatory Visit: Payer: BLUE CROSS/BLUE SHIELD | Admitting: Family Medicine

## 2017-12-20 ENCOUNTER — Ambulatory Visit: Payer: BLUE CROSS/BLUE SHIELD | Admitting: Family Medicine

## 2017-12-27 MED FILL — NORETHINDRONE 5 MG TABLET: 5 | 30 days supply | Qty: 90 | Fill #1

## 2018-01-27 MED FILL — NORETHINDRONE 5 MG TABLET: 5 | 30 days supply | Qty: 90 | Fill #0

## 2018-02-26 MED FILL — NORETHINDRONE 5 MG TABLET: 5 | 30 days supply | Qty: 90 | Fill #1

## 2018-07-04 ENCOUNTER — Ambulatory Visit: Payer: BC Managed Care – PPO | Admitting: Physician Assistant

## 2018-07-04 ENCOUNTER — Encounter: Payer: Self-pay | Admitting: Physician Assistant

## 2018-07-04 ENCOUNTER — Other Ambulatory Visit: Payer: Self-pay

## 2018-07-04 VITALS — BP 110/73 | HR 95 | Temp 98.5°F | Resp 16 | Ht 68.5 in | Wt 177.6 lb

## 2018-07-04 DIAGNOSIS — G43811 Other migraine, intractable, with status migrainosus: Secondary | ICD-10-CM | POA: Diagnosis not present

## 2018-07-04 DIAGNOSIS — R42 Dizziness and giddiness: Secondary | ICD-10-CM | POA: Diagnosis not present

## 2018-07-04 DIAGNOSIS — R82998 Other abnormal findings in urine: Secondary | ICD-10-CM | POA: Diagnosis not present

## 2018-07-04 DIAGNOSIS — R110 Nausea: Secondary | ICD-10-CM | POA: Diagnosis not present

## 2018-07-04 LAB — POCT URINALYSIS DIP (MANUAL ENTRY)
Bilirubin, UA: NEGATIVE
Blood, UA: NEGATIVE
Glucose, UA: NEGATIVE mg/dL
Ketones, POC UA: NEGATIVE mg/dL
Nitrite, UA: NEGATIVE
Protein Ur, POC: NEGATIVE mg/dL
Spec Grav, UA: 1.02 (ref 1.010–1.025)
Urobilinogen, UA: 0.2 U/dL
pH, UA: 7 (ref 5.0–8.0)

## 2018-07-04 LAB — POCT CBC
Granulocyte percent: 57.7 % (ref 37–80)
HCT, POC: 42.2 % (ref 37.7–47.9)
Hemoglobin: 13.7 g/dL (ref 12.2–16.2)
Lymph, poc: 2.1 (ref 0.6–3.4)
MCH, POC: 27.8 pg (ref 27–31.2)
MCHC: 32.5 g/dL (ref 31.8–35.4)
MCV: 85.5 fL (ref 80–97)
MID (cbc): 0.1 (ref 0–0.9)
MPV: 8.2 fL (ref 0–99.8)
POC Granulocyte: 3 (ref 2–6.9)
POC LYMPH PERCENT: 39.7 %L (ref 10–50)
POC MID %: 2.6 %M (ref 0–12)
Platelet Count, POC: 301 10*3/uL (ref 142–424)
RBC: 4.93 M/uL (ref 4.04–5.48)
RDW, POC: 13.3 %
WBC: 5.2 10*3/uL (ref 4.6–10.2)

## 2018-07-04 LAB — POCT URINE PREGNANCY: Preg Test, Ur: NEGATIVE

## 2018-07-04 LAB — GLUCOSE, POCT (MANUAL RESULT ENTRY): POC Glucose: 93 mg/dl (ref 70–99)

## 2018-07-04 MED ORDER — KETOROLAC TROMETHAMINE 30 MG/ML IJ SOLN
30.0000 mg | Freq: Once | INTRAMUSCULAR | Status: AC
  Administered 2018-07-04: 30 mg via INTRAMUSCULAR

## 2018-07-04 MED ORDER — RIZATRIPTAN BENZOATE 10 MG PO TABS: 10.0000 mg | ORAL_TABLET | ORAL | 0 refills | Status: DC | PRN

## 2018-07-04 MED ORDER — PROMETHAZINE HCL 25 MG/ML IJ SOLN
25.0000 mg | Freq: Once | INTRAMUSCULAR | Status: AC
  Administered 2018-07-04: 25 mg via INTRAMUSCULAR

## 2018-07-04 MED ORDER — BUTALBITAL-APAP-CAFFEINE 50-325-40 MG PO TABS: 1.0000 | ORAL_TABLET | Freq: Four times a day (QID) | ORAL | 0 refills | Status: AC | PRN

## 2018-07-04 NOTE — Patient Instructions (Addendum)
Maxalt 10 mg as needed for migraine.  FIORICET - this will make you very drowsy and help you sleep. Do not take this and drive or work.   Stay well hydrated!! Drink half you body weight in ounces of water per day Come back if you are not improving.   Rizatriptan tablets What is this medicine? RIZATRIPTAN (rye za TRIP tan) is used to treat migraines with or without aura. An aura is a strange feeling or visual disturbance that warns you of an attack. It is not used to prevent migraines. This medicine may be used for other purposes; ask your health care provider or pharmacist if you have questions. COMMON BRAND NAME(S): Maxalt What should I tell my health care provider before I take this medicine? They need to know if you have any of these conditions: -bowel disease or colitis -diabetes -family history of heart disease -fast or irregular heart beat -heart or blood vessel disease, angina (chest pain), or previous heart attack -high blood pressure -high cholesterol -history of stroke, transient ischemic attacks (TIAs or mini-strokes), or intracranial bleeding -kidney or liver disease -overweight -poor circulation -postmenopausal or surgical removal of uterus and ovaries -Raynaud's disease -seizure disorder -an unusual or allergic reaction to rizatriptan, other medicines, foods, dyes, or preservatives -pregnant or trying to get pregnant -breast-feeding How should I use this medicine? This medicine is taken by mouth with a glass of water. Follow the directions on the prescription label. This medicine is taken at the first symptoms of a migraine. It is not for everyday use. If your migraine headache returns after one dose, you can take another dose as directed. You must leave at least 2 hours between doses, and do not take more than 30 mg total in 24 hours. If there is no improvement at all after the first dose, do not take a second dose without talking to your doctor or health care  professional. Do not take your medicine more often than directed. Talk to your pediatrician regarding the use of this medicine in children. While this drug may be prescribed for children as young as 6 years for selected conditions, precautions do apply. Overdosage: If you think you have taken too much of this medicine contact a poison control center or emergency room at once. NOTE: This medicine is only for you. Do not share this medicine with others. What if I miss a dose? This does not apply; this medicine is not for regular use. What may interact with this medicine? Do not take this medicine with any of the following medicines: -amphetamine, dextroamphetamine or cocaine -dihydroergotamine, ergotamine, ergoloid mesylates, methysergide, or ergot-type medication - do not take within 24 hours of taking rizatriptan -feverfew -MAOIs like Carbex, Eldepryl, Marplan, Nardil, and Parnate - do not take rizatriptan within 2 weeks of stopping MAOI therapy. -other migraine medicines like almotriptan, eletriptan, naratriptan, sumatriptan, zolmitriptan - do not take within 24 hours of taking rizatriptan -tryptophan This medicine may also interact with the following medications: -medicines for mental depression, anxiety or mood problems -propranolol This list may not describe all possible interactions. Give your health care provider a list of all the medicines, herbs, non-prescription drugs, or dietary supplements you use. Also tell them if you smoke, drink alcohol, or use illegal drugs. Some items may interact with your medicine. What should I watch for while using this medicine? Only take this medicine for a migraine headache. Take it if you get warning symptoms or at the start of a migraine attack. It is  not for regular use to prevent migraine attacks. You may get drowsy or dizzy. Do not drive, use machinery, or do anything that needs mental alertness until you know how this medicine affects you. To reduce  dizzy or fainting spells, do not sit or stand up quickly, especially if you are an older patient. Alcohol can increase drowsiness, dizziness and flushing. Avoid alcoholic drinks. Smoking cigarettes may increase the risk of heart-related side effects from using this medicine. If you take migraine medicines for 10 or more days a month, your migraines may get worse. Keep a diary of headache days and medicine use. Contact your healthcare professional if your migraine attacks occur more frequently. What side effects may I notice from receiving this medicine? Side effects that you should report to your doctor or health care professional as soon as possible: -allergic reactions like skin rash, itching or hives, swelling of the face, lips, or tongue -fast, slow, or irregular heart beat -increased or decreased blood pressure -seizures -severe stomach pain and cramping, bloody diarrhea -signs and symptoms of a blood clot such as breathing problems; changes in vision; chest pain; severe, sudden headache; pain, swelling, warmth in the leg; trouble speaking; sudden numbness or weakness of the face, arm or leg -tingling, pain, or numbness in the face, hands, or feet Side effects that usually do not require medical attention (report to your doctor or health care professional if they continue or are bothersome): -drowsiness -dry mouth -feeling warm, flushing, or redness of the face -headache -muscle cramps, pain -nausea, vomiting -unusually weak or tired This list may not describe all possible side effects. Call your doctor for medical advice about side effects. You may report side effects to FDA at 1-800-FDA-1088. Where should I keep my medicine? Keep out of the reach of children. Store at room temperature between 15 and 30 degrees C (59 and 86 degrees F). Keep container tightly closed. Throw away any unused medicine after the expiration date. NOTE: This sheet is a summary. It may not cover all possible  information. If you have questions about this medicine, talk to your doctor, pharmacist, or health care provider.  2018 Elsevier/Gold Standard (2013-05-19 10:16:39)  If you have lab work done today you will be contacted with your lab results within the next 2 weeks.  If you have not heard from [place] then please contact us. The fastest way to get your results is to register for My Chart.   IF you received an x-ray today, you will receive an invoice from [facility]. Please contact [facility] at [phone] with questions or concerns regarding your invoice.   IF you received labwork today, you will receive an invoice from [place]. Please contact [facility] at [phone] with questions or concerns regarding your invoice.   Our billing staff will not be able to assist you with questions regarding bills from these companies.  You will be contacted with the lab results as soon as they are available. The fastest way to get your results is to activate your My Chart account. Instructions are located on the last page of this paperwork. If you have not heard from [place] regarding the results in 2 weeks, please contact this office.

## 2018-07-04 NOTE — Progress Notes (Signed)
Stacey Key  MRN: [id number] DOB: August 21, 1986  PCP: Patient, No Pcp Per  Subjective:  Pt is a 32 year old female who presents to clinic for dizziness, HA, nausea x 5 days.  5 days ago she was singing in church. Afterwards she experienced an asthma exacerbation and was without her inhaler. "I basically passed out". "They had to put cold ice packs in my arm pits and behind my neck for a while until I could get up". She has been fatigued with HA and nausea since that time.   HA mostly on the left side of her head and behind left eye. Feels like throbbing. Feels like "pressure" if she bends down. Endorses photophobia.  When I close my eyes it still feels like I'm walking.  Dizzy when I stand up from seated position.  Nausea today and yesterday.  Decreased appetite. Ate 2 meals yesterday, 1 meal today.   She has taken Tylenol PM qhs  No recent illness LMP - h/o endometriosis  Review of Systems  Constitutional: Positive for appetite change.  Eyes: Positive for photophobia. Negative for visual disturbance.  Gastrointestinal: Positive for nausea. Negative for abdominal pain, diarrhea and vomiting.  Neurological: Positive for dizziness, syncope and headaches. Negative for seizures, weakness and light-headedness.    Patient Active Problem List   Diagnosis Date Noted  . Moderate persistent asthma 08/16/2017  . Seasonal allergic rhinitis 08/16/2017  . 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 Dysmenorrhea 08/22/2016  . Arthritis 06/25/2012  . Ovarian cyst, complex 05/05/2012  . GERD (gastroesophageal reflux disease) 11/13/2011    Current Outpatient Medications on File Prior to Visit  Medication Sig Dispense Refill  . albuterol (PROVENTIL HFA;VENTOLIN HFA) 108 (90 Base) MCG/ACT inhaler Inhale 2 puffs into the lungs every 6 (six) hours as needed for wheezing or shortness of breath. 1 Inhaler 6  . beclomethasone (QVAR REDIHALER) 80 MCG/ACT inhaler Inhale 2 puffs 2  (two) times daily into the lungs. 1 Inhaler 2  . benzonatate (TESSALON) 100 MG capsule Take 1-2 capsules (100-200 mg total) by mouth 3 (three) times daily as needed for cough. 20 capsule 0  . cyclobenzaprine (FLEXERIL) 5 MG tablet Take 2 tablets (10 mg total) by mouth 3 (three) times daily as needed for muscle spasms. 30 tablet 0  . fluticasone (FLONASE) 50 MCG/ACT nasal spray Place 2 sprays into both nostrils daily. 16 g 6  . levonorgestrel (MIRENA) 20 MCG/24HR IUD 1 each by Intrauterine route once.    . medroxyPROGESTERone (PROVERA) 10 MG tablet Take 30 mg by mouth daily.  3  . meloxicam (MOBIC) 7.5 MG tablet Take 1-2 tablets (7.5-15 mg total) by mouth daily. 30 tablet 1  . montelukast (SINGULAIR) 10 MG tablet Take 1 tablet (10 mg total) at bedtime by mouth. 30 tablet 5  . nystatin (MYCOSTATIN) 100000 UNIT/ML suspension Take 5 mLs (500,000 Units total) 4 (four) times daily by mouth. 60 mL 1  . ranitidine (ZANTAC) 150 MG tablet TAKE 1 TABLET BY MOUTH TWICE A DAY 60 tablet 0   No current facility-administered medications on file prior to visit.     No Known Allergies   Objective:  BP 110/73 (BP Location: Right Arm, Patient Position: Sitting, Cuff Size: Normal)   Pulse 95   Temp 98.5 F (36.9 C) (Oral)   Resp 16   Ht 5' 8.5" (1.74 m)   Wt 177 lb 9.6 oz (80.6 kg)   SpO2 96%   BMI 26.61 kg/m  Orthostatic  VS for the past 24 hrs:  BP- Lying Pulse- Lying BP- Standing at 0 minutes Pulse- Standing at 0 minutes  07/04/18 1508 121/78 91 127/87 103    Physical Exam  Constitutional: She is oriented to person, place, and time. No distress.  HENT:  Right Ear: Tympanic membrane normal.  Left Ear: Tympanic membrane normal.  Nose: Mucosal edema present. No rhinorrhea. Right sinus exhibits no maxillary sinus tenderness and no frontal sinus tenderness. Left sinus exhibits no maxillary sinus tenderness and no frontal sinus tenderness.  Mouth/Throat: Oropharynx is clear and moist and mucous  membranes are normal.  Eyes: Pupils are equal, round, and reactive to light. Conjunctivae and EOM are normal.  Cardiovascular: Normal rate, regular rhythm and normal heart sounds.  Pulmonary/Chest: Effort normal and breath sounds normal. No respiratory distress. She has no wheezes. She has no rales.  Neurological: She is alert and oriented to person, place, and time.  Skin: Skin is warm and dry.  Psychiatric: Judgment normal.  Vitals reviewed.  Results for orders placed or performed in visit on 07/04/18  POCT urinalysis dipstick  Result Value Ref Range   Color, UA yellow yellow   Clarity, UA clear clear   Glucose, UA negative negative mg/dL   Bilirubin, UA negative negative   Ketones, POC UA negative negative mg/dL   Spec Grav, UA 1.020 1.010 - 1.025   Blood, UA negative negative   pH, UA 7.0 5.0 - 8.0   Protein Ur, POC negative negative mg/dL   Urobilinogen, UA 0.2 0.2 or 1.0 E.U./dL   Nitrite, UA Negative Negative   Leukocytes, UA Trace (A) Negative  POCT CBC  Result Value Ref Range   WBC 5.2 4.6 - 10.2 K/uL   Lymph, poc 2.1 0.6 - 3.4   POC LYMPH PERCENT 39.7 10 - 50 %L   MID (cbc) 0.1 0 - 0.9   POC MID % 2.6 0 - 12 %M   POC Granulocyte 3.0 2 - 6.9   Granulocyte percent 57.7 37 - 80 %G   RBC 4.93 4.04 - 5.48 M/uL   Hemoglobin 13.7 12.2 - 16.2 g/dL   HCT, POC 42.2 37.7 - 47.9 %   MCV 85.5 80 - 97 fL   MCH, POC 27.8 27 - 31.2 pg   MCHC 32.5 31.8 - 35.4 g/dL   RDW, POC 13.3 %   Platelet Count, POC 301 142 - 424 K/uL   MPV 8.2 0 - 99.8 fL  POCT glucose (manual entry)  Result Value Ref Range   POC Glucose 93 70 - 99 mg/dl  POCT urine pregnancy  Result Value Ref Range   Preg Test, Ur Negative Negative   Assessment and Plan :  1. Other migraine with status migrainosus, intractable - Pt prevents for nonintractable migraine s/p near syncope following an asthma exacerbation earlier this week. Labs are not concerning.  Following IV fluids pt endorses nearly 100% improvement.  Migraine prevention discussed. RTC PRN.  - ketorolac (TORADOL) 30 MG/ML injection 30 mg - promethazine (PHENERGAN) injection 25 mg - PR NORMAL SALINE SOLUTION INFUS - PR CATH IMPL VASC ACCESS PORTAL - butalbital-acetaminophen-caffeine (FIORICET, ESGIC) 50-325-40 MG tablet; Take 1-2 tablets by mouth every 6 (six) hours as needed for headache.  Dispense: 12 tablet; Refill: 0 - rizatriptan (MAXALT) 10 MG tablet; Take 1 tablet (10 mg total) by mouth as needed for migraine. May repeat in 2 hours if needed  Dispense: 10 tablet; Refill: 0  2. Dizziness 3. Nausea without vomiting - POCT  urinalysis dipstick - POCT CBC - POCT glucose (manual entry) - POCT urine pregnancy - TSH - Vitamin B12  4. Leukocytes in urine - Urine Culture   Mercer Pod, PA-C  [facility] 07/04/2018 3:30 PM  Please note: Portions of this report may have been transcribed using dragon voice recognition software. Every effort was made to ensure accuracy; however, inadvertent computerized transcription errors may be present.

## 2018-07-05 LAB — TSH: TSH: 2.32 u[IU]/mL (ref 0.450–4.500)

## 2018-07-05 LAB — VITAMIN B12: Vitamin B-12: 539 pg/mL (ref 232–1245)

## 2018-07-06 LAB — URINE CULTURE

## 2018-08-12 ENCOUNTER — Ambulatory Visit (INDEPENDENT_AMBULATORY_CARE_PROVIDER_SITE_OTHER): Payer: BC Managed Care – PPO | Admitting: Family Medicine

## 2018-08-12 ENCOUNTER — Encounter: Payer: Self-pay | Admitting: Family Medicine

## 2018-08-12 ENCOUNTER — Other Ambulatory Visit: Payer: Self-pay

## 2018-08-12 VITALS — BP 109/70 | HR 88 | Temp 98.1°F | Resp 17 | Ht 68.5 in | Wt 178.2 lb

## 2018-08-12 DIAGNOSIS — H8113 Benign paroxysmal vertigo, bilateral: Secondary | ICD-10-CM

## 2018-08-12 DIAGNOSIS — R42 Dizziness and giddiness: Secondary | ICD-10-CM

## 2018-08-12 MED ORDER — MECLIZINE HCL 12.5 MG PO TABS
12.5000 mg | ORAL_TABLET | Freq: Three times a day (TID) | ORAL | 0 refills | Status: DC | PRN
Start: 2018-08-12 — End: 2019-11-11

## 2018-08-12 MED ORDER — FLUTICASONE PROPIONATE 50 MCG/ACT NA SUSP: 2.0000 | Freq: Every day | NASAL | 6 refills | Status: DC

## 2018-08-12 MED ORDER — CETIRIZINE HCL 10 MG PO TABS: 10.0000 mg | ORAL_TABLET | Freq: Every day | ORAL | 11 refills | Status: DC

## 2018-08-12 NOTE — Progress Notes (Signed)
Chief Complaint  Patient presents with  . Nausea     onset: x 1 week and per pt she thinks the nausea and dizziness coming from some ear issues she's having.  Ears itching inside, draining tanish brown drainage, throbbing hot feeling in her ear.   Using q tips to relieve the itch.  Starting being painful over the weekend with burning and ears feel weighted down  . Dizziness    HPI  Patient with dizziness, nausea She woke up in the morning yesterday and the room was spinning She reports that onset of symptoms started a week ago There is nausea and dizziness as well as itching in the ears with draining of tan brown drainage Felt a throbbing hot feeling in the ear and noticed it after cleaning her ear with a qtip    Past Medical History:  Diagnosis Date  . Allergy   . Anemia   . Arthritis    shoulders, knee, hand  . Asthma   . Endometriosis   . GERD (gastroesophageal reflux disease)   . Ovarian cyst   . Vitamin D deficiency     Current Outpatient Medications  Medication Sig Dispense Refill  . albuterol (PROVENTIL HFA;VENTOLIN HFA) 108 (90 Base) MCG/ACT inhaler Inhale 2 puffs into the lungs every 6 (six) hours as needed for wheezing or shortness of breath. 1 Inhaler 6  . beclomethasone (QVAR REDIHALER) 80 MCG/ACT inhaler Inhale 2 puffs 2 (two) times daily into the lungs. 1 Inhaler 2  . butalbital-acetaminophen-caffeine (FIORICET, ESGIC) 50-325-40 MG tablet Take 1-2 tablets by mouth every 6 (six) hours as needed for headache. 12 tablet 0  . Elagolix Sodium (ORILISSA) 150 MG TABS Take by mouth.    . fluticasone (FLONASE) 50 MCG/ACT nasal spray Place 2 sprays into both nostrils daily. 16 g 6  . levonorgestrel (MIRENA) 20 MCG/24HR IUD 1 each by Intrauterine route once.    . medroxyPROGESTERone (PROVERA) 10 MG tablet Take 30 mg by mouth daily.  3  . montelukast (SINGULAIR) 10 MG tablet Take 1 tablet (10 mg total) at bedtime by mouth. 30 tablet 5  . rizatriptan (MAXALT) 10 MG tablet  Take 1 tablet (10 mg total) by mouth as needed for migraine. May repeat in 2 hours if needed 10 tablet 0  . cetirizine (ZYRTEC) 10 MG tablet Take 1 tablet (10 mg total) by mouth daily. 30 tablet 11   No current facility-administered medications for this visit.     Allergies: No Known Allergies  Past Surgical History:  Procedure Laterality Date  . HERNIA REPAIR    . LAPAROSCOPY N/A 08/22/2016   Procedure: LAPAROSCOPY OPERATIVE,excision of posterior culdesac endometriosis;  Surgeon: Eldred Manges, MD;  Location: [facility];  Service: Gynecology;  Laterality: N/A;  . WISDOM TOOTH EXTRACTION      Social History   Socioeconomic History  . Marital status: Single    Spouse name: Not on file  . Number of children: Not on file  . Years of education: Not on file  . Highest education level: Not on file  Occupational History  . Not on file  Social Needs  . Financial resource strain: Not on file  . Food insecurity:    Worry: Not on file    Inability: Not on file  . Transportation needs:    Medical: Not on file    Non-medical: Not on file  Tobacco Use  . Smoking status: Never Smoker  . Smokeless tobacco: Never Used  Substance and Sexual Activity  .  Alcohol use: No  . Drug use: No  . Sexual activity: Never    Birth control/protection: Injection  Lifestyle  . Physical activity:    Days per week: Not on file    Minutes per session: Not on file  . Stress: Not on file  Relationships  . Social connections:    Talks on phone: Not on file    Gets together: Not on file    Attends religious service: Not on file    Active member of club or organization: Not on file    Attends meetings of clubs or organizations: Not on file    Relationship status: Not on file  Other Topics Concern  . Not on file  Social History Narrative  . Not on file    Family History  Problem Relation Age of Onset  . Anemia Mother   . GER disease Mother   . Arthritis Maternal Grandmother   . Hypertension  Maternal Grandmother   . Hypertension Maternal Grandfather   . Clotting disorder Maternal Grandfather   . Hypertension Paternal Grandfather   . Other Neg Hx      ROS Review of Systems See HPI Constitution: No fevers or chills No malaise No diaphoresis Skin: No rash or itching Eyes: no blurry vision, no double vision GU: no dysuria or hematuria Neuro: see hpi  all others reviewed and negative   Objective: Vitals:   08/12/18 1653  BP: 109/70  Pulse: 88  Resp: 17  Temp: 98.1 F (36.7 C)  TempSrc: Oral  SpO2: 100%  Weight: 178 lb 3.2 oz (80.8 kg)  Height: 5' 8.5" (1.74 m)    Physical Exam General: alert, oriented, in NAD Head: normocephalic, atraumatic, no sinus tenderness Eyes: EOM intact, no scleral icterus or conjunctival injection Ears: TM clear bilaterally Nose: mucosa nonerythematous, nonedematous Throat: no pharyngeal exudate or erythema Lymph: no posterior auricular, submental or cervical lymph adenopathy Heart: normal rate, normal sinus rhythm, no murmurs Lungs: clear to auscultation bilaterally, no wheezing  Neuro: CRANIAL NERVES: CN II: Visual fields are full to confrontation. Fundoscopic exam is normal with sharp discs and no vascular changes. Pupils are round equal and briskly reactive to light. CN III, IV, VI: extraocular movement are normal. No ptosis. CN V: Facial sensation is intact to pinprick in all 3 divisions bilaterally. Corneal responses are intact.  CN VII: Face is symmetric with normal eye closure and smile. CN VIII: Hearing is normal to rubbing fingers CN IX, X: Palate elevates symmetrically. Phonation is normal. CN XI: Head turning and shoulder shrug are intact CN XII: Tongue is midline with normal movements and no atrophy. No nystagmus, normal reflexes, normal gait  Assessment and Plan Aalayah was seen today for nausea and dizziness.  Diagnoses and all orders for this visit:  Dizziness -     Ambulatory referral to Physical  Therapy  Benign paroxysmal positional vertigo due to bilateral vestibular disorder -     Ambulatory referral to Physical Therapy  Other orders -     fluticasone (FLONASE) 50 MCG/ACT nasal spray; Place 2 sprays into both nostrils daily. -     cetirizine (ZYRTEC) 10 MG tablet; Take 1 tablet (10 mg total) by mouth daily.  Likely inner ear dysfunction Will refer to PT for vestibular rehabilitation  Antihistamine and nasal steroid advise Advised to stop use of qtips and small implements in the ear   Branch

## 2018-08-12 NOTE — Patient Instructions (Addendum)
If you have lab work done today you will be contacted with your lab results within the next 2 weeks.  If you have not heard from [place] then please contact us. The fastest way to get your results is to register for My Chart.   IF you received an x-ray today, you will receive an invoice from [facility]. Please contact [facility] at [phone] with questions or concerns regarding your invoice.   IF you received labwork today, you will receive an invoice from [facility]. Please contact [facility] at [phone] with questions or concerns regarding your invoice.   Our billing staff will not be able to assist you with questions regarding bills from these companies.  You will be contacted with the lab results as soon as they are available. The fastest way to get your results is to activate your My Chart account. Instructions are located on the last page of this paperwork. If you have not heard from [place] regarding the results in 2 weeks, please contact this office.     Benign Positional Vertigo Vertigo is the feeling that you or your surroundings are moving when they are not. Benign positional vertigo is the most common form of vertigo. The cause of this condition is not serious (is benign). This condition is triggered by certain movements and positions (is positional). This condition can be dangerous if it occurs while you are doing something that could endanger you or others, such as driving. What are the causes? In many cases, the cause of this condition is not known. It may be caused by a disturbance in an area of the inner ear that helps your brain to sense movement and balance. This disturbance can be caused by a viral infection (labyrinthitis), head injury, or repetitive motion. What increases the risk? This condition is more likely to develop in:  Women.  People who are 30 years of age or older.  What are the signs or symptoms? Symptoms of this condition usually happen  when you move your head or your eyes in different directions. Symptoms may start suddenly, and they usually last for less than a minute. Symptoms may include:  Loss of balance and falling.  Feeling like you are spinning or moving.  Feeling like your surroundings are spinning or moving.  Nausea and vomiting.  Blurred vision.  Dizziness.  Involuntary eye movement (nystagmus).  Symptoms can be mild and cause only slight annoyance, or they can be severe and interfere with daily life. Episodes of benign positional vertigo may return (recur) over time, and they may be triggered by certain movements. Symptoms may improve over time. How is this diagnosed? This condition is usually diagnosed by medical history and a physical exam of the head, neck, and ears. You may be referred to a health care provider who specializes in ear, nose, and throat (ENT) problems (otolaryngologist) or a provider who specializes in disorders of the nervous system (neurologist). You may have additional testing, including:  MRI.  A CT scan.  Eye movement tests. Your health care provider may ask you to change positions quickly while he or she watches you for symptoms of benign positional vertigo, such as nystagmus. Eye movement may be tested with an electronystagmogram (ENG), caloric stimulation, the Dix-Hallpike test, or the roll test.  An electroencephalogram (EEG). This records electrical activity in your brain.  Hearing tests.  How is this treated? Usually, your health care provider will treat this by moving your head in specific positions to adjust  your inner ear back to normal. Surgery may be needed in severe cases, but this is rare. In some cases, benign positional vertigo may resolve on its own in 2-4 weeks. Follow these instructions at home: Safety  Move slowly.Avoid sudden body or head movements.  Avoid driving.  Avoid operating heavy machinery.  Avoid doing any tasks that would be dangerous to you  or others if a vertigo episode would occur.  If you have trouble walking or keeping your balance, try using a cane for stability. If you feel dizzy or unstable, sit down right away.  Return to your normal activities as told by your health care provider. Ask your health care provider what activities are safe for you. General instructions  Take over-the-counter and prescription medicines only as told by your health care provider.  Avoid certain positions or movements as told by your health care provider.  Drink enough fluid to keep your urine clear or pale yellow.  Keep all follow-up visits as told by your health care provider. This is important. Contact a health care provider if:  You have a fever.  Your condition gets worse or you develop new symptoms.  Your family or friends notice any behavioral changes.  Your nausea or vomiting gets worse.  You have numbness or a "pins and needles" sensation. Get help right away if:  You have difficulty speaking or moving.  You are always dizzy.  You faint.  You develop severe headaches.  You have weakness in your legs or arms.  You have changes in your hearing or vision.  You develop a stiff neck.  You develop sensitivity to light. This information is not intended to replace advice given to you by your health care provider. Make sure you discuss any questions you have with your health care provider. Document Released: 06/25/2006 Document Revised: 02/23/2016 Document Reviewed: 01/10/2015 Elsevier Interactive Patient Education  [employer].

## 2018-09-05 ENCOUNTER — Ambulatory Visit: Payer: Self-pay

## 2018-09-12 ENCOUNTER — Ambulatory Visit: Payer: BC Managed Care – PPO | Attending: Family Medicine | Admitting: Physical Therapy

## 2018-09-12 ENCOUNTER — Encounter: Payer: Self-pay | Admitting: Physical Therapy

## 2018-09-12 ENCOUNTER — Other Ambulatory Visit: Payer: Self-pay

## 2018-09-12 DIAGNOSIS — R2681 Unsteadiness on feet: Secondary | ICD-10-CM | POA: Insufficient documentation

## 2018-09-12 DIAGNOSIS — R42 Dizziness and giddiness: Secondary | ICD-10-CM | POA: Insufficient documentation

## 2018-09-12 NOTE — Therapy (Signed)
[facility] [address] [place], [place], [address] Phone: [phone]   Fax:  [phone]  Physical Therapy Evaluation  Patient Details  Name: Stacey Key MRN: [id number] Date of Birth: [birth year] Referring Provider (PT): Dr. Delia Chimes   Encounter Date: 09/12/2018  PT End of Session - 09/12/18 1552    Visit Number  1    Number of Visits  7    Date for PT Re-Evaluation  11/07/18    PT Start Time  1406    PT Stop Time  1445    PT Time Calculation (min)  39 min    Activity Tolerance  Patient tolerated treatment well    Behavior During Therapy  [facility] for tasks assessed/performed       Past Medical History:  Diagnosis Date  . Allergy   . Anemia   . Arthritis    shoulders, knee, hand  . Asthma   . Endometriosis   . GERD (gastroesophageal reflux disease)   . Ovarian cyst   . Vitamin D deficiency     Past Surgical History:  Procedure Laterality Date  . HERNIA REPAIR    . LAPAROSCOPY N/A 08/22/2016   Procedure: LAPAROSCOPY OPERATIVE,excision of posterior culdesac endometriosis;  Surgeon: Eldred Manges, MD;  Location: [facility];  Service: Gynecology;  Laterality: N/A;  . WISDOM TOOTH EXTRACTION      There were no vitals filed for this visit.   Subjective Assessment - 09/12/18 1409    Subjective  patient reporitng itching at L ear - uses q-tips to relieve itching. Feels like her ear will throb. Feels dizzy - experience room moving - has fallen a couple times. Symptoms started ~1 month ago. Has dizziness when first waking up - opening eyes and lifting head up - has associated nausea. Can ahppen throughout day if she stands up too fast - has to catch herself on the wall. Went to the movies this past Tuesday - when first standing had a dizzy episode (feels like she is going to pass out). Experiences headaches.     Pertinent History  n/a    Patient Stated Goals  reduce dizzy symptoms    Currently in Pain?   Yes    Pain Score  2     Pain Location  Head   and L ear   Pain Orientation  Left    Pain Descriptors / Indicators  Aching;Throbbing;Headache    Pain Type  Acute pain    Pain Onset  1 to 4 weeks ago    Pain Frequency  Intermittent         [facility] PT Assessment - 09/12/18 1549      Assessment   Medical Diagnosis  dizziness, BPPV    Referring Provider (PT)  Dr. Delia Chimes    Onset Date/Surgical Date  --   ~1 month ago   Next MD Visit  --   prn   Prior Therapy  no      Precautions   Precautions  None      Restrictions   Weight Bearing Restrictions  No      Balance Screen   Has the patient fallen in the past 6 months  No    Has the patient had a decrease in activity level because of a fear of falling?   No    Is the patient reluctant to leave their home because of a fear of falling?   No      Home  [place]video [place] residence      Prior Function   Level of Independence  Independent    Vocation  Full time employment    Vocation Requirements  works full time for [employer]; computer/desk work      Cognition   Overall Cognitive Status  Within [employer] for tasks assessed      Coordination   [employer]ross [employer] are Fluid and Coordinated  Yes    Fine Motor Movements are Fluid and Coordinated  Yes      Posture/Postural Control   Posture/Postural Control  Postural limitations    Postural Limitations  Rounded Shoulders;Forward head           Vestibular Assessment - 09/12/18 1415      Vestibular Assessment   General Observation  well appearing - wears glasses (during wokr time/computer)      Symptom Behavior   Type of Dizziness  Imbalance   spining   Frequency of Dizziness  3x day    Duration of Dizziness  10 seconds    Aggravating Factors  Mornings;Sit to stand;Turning head quickly    Relieving Factors  Closing eyes      Occulomotor Exam   Occulomotor Alignment  Normal    Spontaneous  Absent    Gaze-induced  Absent    Smooth  Pursuits  Intact    Saccades  Intact    Comment  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       Vestibular Treatment/Exercise - 09/12/18 0001      Vestibular Treatment/Exercise   Vestibular Treatment Provided  Gaze    Gaze Exercises  X1 Viewing Horizontal;X1 Viewing Vertical      X1 Viewing Horizontal   Foot Position  seated    Time  --   30 sec   Reps  1    Comments  made her feel "dizzy"      X1 Viewing Vertical   Foot Position  seated    Time  --   30 sec   Reps  1            PT Education - 09/12/18 1551    Education Details  exam findings, likely need for ENT referral, POC, goal establishment, VOR x1    Person(s) Educated  Patient    Methods  Explanation;Handout    Comprehension  Verbalized understanding          PT Long Term Goals - 09/12/18 1609      PT LONG TERM GOAL #1   Title  patient to be indpendent with HEP    Time  6    Period  Weeks    Status  New    Target Date  11/07/18      PT LONG TERM GOAL #2   Title  patient to demosntrate EC on foam for >/= 30 seconds without LOB    Time  6    Period  Weeks    Status  New    Target Date  11/07/18  PT LONG TERM GOAL #3   Title  patient to report no falls and dizzy symtpoms (frequency, intensity, duration) reduction by >/= 50%    Time  6    Period  Weeks    Status  New    Target Date  11/07/18      PT LONG TERM GOAL #4   Title  PT to assess SOT or high level balance assessment as indicated with goal    Time  6    Period  Weeks    Status  New    Target Date  11/07/18             Plan - 09/12/18 1552    Clinical Impression  Statement  Patient is a 32 y/o female presenting to [facility] today regarding primary complaints of inner ear pain, drainage, itchiness, and heaviness, along with associated lightheadedness and "dizziness" with positional changes. Patient reports to falls due to this. Patient with negative DIx-Hallpike and Roll test - no deficinitive signs/symptoms of BPPV. Did have some issue with head impulse test along with staninf with eyes closed on foam. Did recommend PT intervention 1x/week for 6 weeks to focus on high level balance and VOR/gaze, as well as needed referral for ENT due to continued symptoms at ear. Advised patient that this PT would be sending note to MD along with patient needing to call PCP inregards to obtaining referral. Patient to benefit form skilled PT intervention to address high level balance, instability, and falls.     History and Personal Factors relevant to plan of care:  n/a    Clinical Presentation  Stable    Clinical Presentation due to:  history of 2 falls, symptoms ~1 month    Clinical Decision Making  Low    Rehab Potential  Good    PT Frequency  1x / week    PT Duration  6 weeks    PT Treatment/Interventions  ADLs/Self Care Home Management;Canalith Repostioning;Functional mobility training;Gait training;Therapeutic activities;Therapeutic exercise;Balance training;Neuromuscular re-education;Patient/family education;Passive range of motion;Visual/perceptual remediation/compensation;Vestibular;Taping;Manual techniques    Consulted and Agree with Plan of Care  Patient       Patient will benefit from skilled therapeutic intervention in order to improve the following deficits and impairments:  Decreased balance, Postural dysfunction, Dizziness  Visit Diagnosis: Unsteadiness on feet  Dizziness and giddiness     Problem List Patient Active Problem List   Diagnosis Date Noted  . Moderate persistent asthma 08/16/2017  . Seasonal allergic rhinitis 08/16/2017  . 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 Dysmenorrhea 08/22/2016  . Arthritis 06/25/2012  . Ovarian cyst, complex 05/05/2012  . GERD (gastroesophageal reflux disease) 11/13/2011     Lanney Gins, PT, DPT Supplemental Physical Therapist 09/12/18 4:11 PM Pager: [phone] Office: [facility] [address] [place], [place], [address] Phone: [phone]   Fax:  [phone]  Name: OVELLA MANYGOATS MRN: [id number] Date of Birth: 01/15/1986

## 2018-09-19 ENCOUNTER — Encounter

## 2018-09-26 ENCOUNTER — Ambulatory Visit: Payer: BC Managed Care – PPO | Admitting: Rehabilitative and Restorative Service Providers"

## 2018-10-03 ENCOUNTER — Ambulatory Visit: Payer: BC Managed Care – PPO | Admitting: Physical Therapy

## 2018-10-10 ENCOUNTER — Ambulatory Visit: Payer: BC Managed Care – PPO | Admitting: Rehabilitative and Restorative Service Providers"

## 2018-10-17 ENCOUNTER — Ambulatory Visit: Payer: BC Managed Care – PPO | Admitting: Physical Therapy

## 2018-10-24 ENCOUNTER — Ambulatory Visit: Payer: BC Managed Care – PPO | Admitting: Physical Therapy

## 2018-12-16 ENCOUNTER — Other Ambulatory Visit: Payer: Self-pay

## 2018-12-16 ENCOUNTER — Ambulatory Visit: Payer: BC Managed Care – PPO | Admitting: Family Medicine

## 2018-12-16 ENCOUNTER — Encounter: Payer: Self-pay | Admitting: Family Medicine

## 2018-12-16 VITALS — BP 114/72 | HR 81 | Temp 97.9°F | Resp 17 | Ht 68.5 in | Wt 177.0 lb

## 2018-12-16 DIAGNOSIS — L02414 Cutaneous abscess of left upper limb: Secondary | ICD-10-CM

## 2018-12-16 DIAGNOSIS — L910 Hypertrophic scar: Secondary | ICD-10-CM

## 2018-12-16 MED ORDER — DOXYCYCLINE HYCLATE 100 MG PO TABS: 100.0000 mg | ORAL_TABLET | Freq: Two times a day (BID) | ORAL | 0 refills | Status: DC

## 2018-12-16 NOTE — Progress Notes (Signed)
Subjective:    Patient ID: Stacey Key, female    DOB: [birth year], 33 y.o.   MRN: [id number]  HPI Stacey Key is a 33 y.o. female Presents today for: Chief Complaint  Patient presents with  . Shoulder Injury    boil on shoulder    Bump on left shoulder. Present since 2014. Initially thought was keloid, growing in size over the years, had been using otc cream.  More sore and irritated to touch last week - applied apple cider vinegar.  Ruptured with white d/c and blood last night. Sore today.  No fever.  Few itching bumps on back.  No dermatologist.    Patient Active Problem List   Diagnosis Date Noted  . Moderate persistent asthma 08/16/2017  . Seasonal allergic rhinitis 08/16/2017  . 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 Dysmenorrhea 08/22/2016  . Arthritis 06/25/2012  . Ovarian cyst, complex 05/05/2012  . GERD (gastroesophageal reflux disease) 11/13/2011   Past Medical History:  Diagnosis Date  . Allergy   . Anemia   . Arthritis    shoulders, knee, hand  . Asthma   . Endometriosis   . GERD (gastroesophageal reflux disease)   . Ovarian cyst   . Vitamin D deficiency    Past Surgical History:  Procedure Laterality Date  . HERNIA REPAIR    . LAPAROSCOPY N/A 08/22/2016   Procedure: LAPAROSCOPY OPERATIVE,excision of posterior culdesac endometriosis;  Surgeon: Eldred Manges, MD;  Location: [facility];  Service: Gynecology;  Laterality: N/A;  . WISDOM TOOTH EXTRACTION     No Known Allergies Prior to Admission medications   Medication Sig Start Date End Date Taking? Authorizing Provider  albuterol (PROVENTIL HFA;VENTOLIN HFA) 108 (90 Base) MCG/ACT inhaler Inhale 2 puffs into the lungs every 6 (six) hours as needed for wheezing or shortness of breath. 07/12/17  Yes Rutherford Guys, MD  beclomethasone (QVAR REDIHALER) 80 MCG/ACT inhaler Inhale 2 puffs 2 (two) times daily into the lungs. 08/16/17  Yes Rutherford Guys, MD   butalbital-acetaminophen-caffeine (FIORICET, ESGIC) [phone] MG tablet Take 1-2 tablets by mouth every 6 (six) hours as needed for headache. 07/04/18 07/04/19 Yes McVey, Gelene Mink, PA-C  cetirizine (ZYRTEC) 10 MG tablet Take 1 tablet (10 mg total) by mouth daily. 08/12/18  Yes Forrest Moron, MD  Elagolix Sodium (ORILISSA) 150 MG TABS Take by mouth.   Yes [provider]  fluticasone (FLONASE) 50 MCG/ACT nasal spray Place 2 sprays into both nostrils daily. 08/12/18  Yes Forrest Moron, MD  levonorgestrel (MIRENA) 20 MCG/24HR IUD 1 each by Intrauterine route once.   Yes [provider]  meclizine (ANTIVERT) 12.5 MG tablet Take 1 tablet (12.5 mg total) by mouth 3 (three) times daily as needed for dizziness. 08/12/18  Yes Stallings, Zoe A, MD  medroxyPROGESTERone (PROVERA) 10 MG tablet Take 30 mg by mouth daily. 06/01/16  Yes [provider]  montelukast (SINGULAIR) 10 MG tablet Take 1 tablet (10 mg total) at bedtime by mouth. 08/16/17  Yes Rutherford Guys, MD  rizatriptan (MAXALT) 10 MG tablet Take 1 tablet (10 mg total) by mouth as needed for migraine. May repeat in 2 hours if needed 07/04/18  Yes McVey, Gelene Mink, PA-C   Social History   Socioeconomic History  . Marital status: Single    Spouse name: Not on file  . Number of children: Not on file  . Years of education: Not on file  . Highest education level: Not  on file  Occupational History  . Not on file  Social Needs  . Financial resource strain: Not on file  . Food insecurity:    Worry: Not on file    Inability: Not on file  . Transportation needs:    Medical: Not on file    Non-medical: Not on file  Tobacco Use  . Smoking status: Never Smoker  . Smokeless tobacco: Never Used  Substance and Sexual Activity  . Alcohol use: No  . Drug use: No  . Sexual activity: Never    Birth control/protection: Injection  Lifestyle  . Physical activity:    Days per week: Not on file    Minutes  per session: Not on file  . Stress: Not on file  Relationships  . Social connections:    Talks on phone: Not on file    Gets together: Not on file    Attends religious service: Not on file    Active member of club or organization: Not on file    Attends meetings of clubs or organizations: Not on file    Relationship status: Not on file  . Intimate partner violence:    Fear of current or ex partner: Not on file    Emotionally abused: Not on file    Physically abused: Not on file    Forced sexual activity: Not on file  Other Topics Concern  . Not on file  Social History Narrative  . Not on file    Review of Systems Per HPI    Objective:   Physical Exam Constitutional:      General: She is not in acute distress.    Appearance: She is well-developed.  HENT:     Head: Normocephalic and atraumatic.  Cardiovascular:     Rate and Rhythm: Normal rate.  Pulmonary:     Effort: Pulmonary effort is normal.  Skin:      Neurological:     Mental Status: She is alert and oriented to person, place, and time.    Vitals:   12/16/18 1456  BP: 114/72  Pulse: 81  Resp: 17  Temp: 97.9 F (36.6 C)  TempSrc: Oral  SpO2: 98%  Weight: 177 lb (80.3 kg)  Height: 5' 8.5" (1.74 m)       Assessment & Plan:    Stacey Key is a 33 y.o. female Cutaneous abscess of left upper extremity - Plan: doxycycline (VIBRA-TABS) 100 MG tablet  -Possible sebaceous cyst versus epidermal cyst, less likely keloid secondary infection that has now opened on its own.  No sign of surrounding erythema/cellulitis.  Do not appreciate significant induration or need for incision and drainage at this time  -Start doxycycline, potential side effects/risk discussed.  Symptomatic care with warm compresses, cleansing with soap and water, bandage as needed with RTC precautions  Keloid - Plan: Ambulatory referral to Dermatology,   -Abdominal wall keloid without signs of infection.  Refer to dermatology for further  evaluation.  They can also evaluate the left shoulder issue as well as few areas on back that may be keloids versus nevi.   Meds ordered this encounter  Medications  . doxycycline (VIBRA-TABS) 100 MG tablet    Sig: Take 1 tablet (100 mg total) by mouth 2 (two) times daily.    Dispense:  20 tablet    Refill:  0   Patient Instructions   Start doxycycline for possible small abscess on the shoulder.  At this time I do not think that needs to  be incised and but if any increased swelling, redness or it appears to be worsening, return for recheck.  For now okay to continue warm compresses the area 3-4 times per day, soap and water cleansing to area with gentle pressure afterwards to express any exudate if needed.  Recheck if not improving within the next 1 week, sooner if worse.  I did refer you to dermatology to follow-up on the other areas of the back and the keloid of the abdomen.   Skin Abscess  A skin abscess is an infected area of your skin that contains pus and other material. An abscess can happen in any part of your body. Some abscesses break open (rupture) on their own. Most continue to get worse unless they are treated. The infection can spread deeper into the body and into your blood, which can make you feel sick. A skin abscess is caused by germs that enter the skin through a cut or scrape. It can also be caused by blocked oil and sweat glands or infected hair follicles. This condition is usually treated by:  Draining the pus.  Taking antibiotic medicines.  Placing a warm, wet washcloth over the abscess. Follow these instructions at home: Medicines   Take over-the-counter and prescription medicines only as told by your doctor.  If you were prescribed an antibiotic medicine, take it as told by your doctor. Do not stop taking the antibiotic even if you start to feel better. Abscess care   If you have an abscess that has not drained, place a warm, clean, wet washcloth over the  abscess several times a day. Do this as told by your doctor.  Follow instructions from your doctor about how to take care of your abscess. Make sure you: ? Cover the abscess with a bandage (dressing). ? Change your bandage or gauze as told by your doctor. ? Wash your hands with soap and water before you change the bandage or gauze. If you cannot use soap and water, use hand sanitizer.  Check your abscess every day for signs that the infection is getting worse. Check for: ? More redness, swelling, or pain. ? More fluid or blood. ? Warmth. ? More pus or a bad smell. General instructions  To avoid spreading the infection: ? Do not share personal care items, towels, or hot tubs with others. ? Avoid making skin-to-skin contact with other people.  Keep all follow-up visits as told by your doctor. This is important. Contact a doctor if:  You have more redness, swelling, or pain around your abscess.  You have more fluid or blood coming from your abscess.  Your abscess feels warm when you touch it.  You have more pus or a bad smell coming from your abscess.  You have a fever.  Your muscles ache.  You have chills.  You feel sick. Get help right away if:  You have very bad (severe) pain.  You see red streaks on your skin spreading away from the abscess. Summary  A skin abscess is an infected area of your skin that contains pus and other material.  The abscess is caused by germs that enter the skin through a cut or scrape. It can also be caused by blocked oil and sweat glands or infected hair follicles.  Follow your doctor's instructions on caring for your abscess, taking medicines, preventing infections, and keeping follow-up visits. This information is not intended to replace advice given to you by your health care provider. Make sure you discuss  any questions you have with your health care provider. Document Released: 03/05/2008 Document Revised: 10/31/2017 Document Reviewed:  10/31/2017 Elsevier Interactive Patient Education  [employer].     If you have lab work done today you will be contacted with your lab results within the next 2 weeks.  If you have not heard from [place] then please contact us. The fastest way to get your results is to register for My Chart.   IF you received an x-ray today, you will receive an invoice from [facility]. Please contact [facility] at [phone] with questions or concerns regarding your invoice.   IF you received labwork today, you will receive an invoice from [facility]. Please contact [facility] at [phone] with questions or concerns regarding your invoice.   Our billing staff will not be able to assist you with questions regarding bills from these companies.  You will be contacted with the lab results as soon as they are available. The fastest way to get your results is to activate your My Chart account. Instructions are located on the last page of this paperwork. If you have not heard from [place] regarding the results in 2 weeks, please contact this office.       Signed,   Merri Ray, MD [facility].  12/17/18 9:49 PM

## 2018-12-16 NOTE — Patient Instructions (Addendum)
Start doxycycline for possible small abscess on the shoulder.  At this time I do not think that needs to be incised and but if any increased swelling, redness or it appears to be worsening, return for recheck.  For now okay to continue warm compresses the area 3-4 times per day, soap and water cleansing to area with gentle pressure afterwards to express any exudate if needed.  Recheck if not improving within the next 1 week, sooner if worse.  I did refer you to dermatology to follow-up on the other areas of the back and the keloid of the abdomen.   Skin Abscess  A skin abscess is an infected area of your skin that contains pus and other material. An abscess can happen in any part of your body. Some abscesses break open (rupture) on their own. Most continue to get worse unless they are treated. The infection can spread deeper into the body and into your blood, which can make you feel sick. A skin abscess is caused by germs that enter the skin through a cut or scrape. It can also be caused by blocked oil and sweat glands or infected hair follicles. This condition is usually treated by:  Draining the pus.  Taking antibiotic medicines.  Placing a warm, wet washcloth over the abscess. Follow these instructions at home: Medicines   Take over-the-counter and prescription medicines only as told by your doctor.  If you were prescribed an antibiotic medicine, take it as told by your doctor. Do not stop taking the antibiotic even if you start to feel better. Abscess care   If you have an abscess that has not drained, place a warm, clean, wet washcloth over the abscess several times a day. Do this as told by your doctor.  Follow instructions from your doctor about how to take care of your abscess. Make sure you: ? Cover the abscess with a bandage (dressing). ? Change your bandage or gauze as told by your doctor. ? Wash your hands with soap and water before you change the bandage or gauze. If you  cannot use soap and water, use hand sanitizer.  Check your abscess every day for signs that the infection is getting worse. Check for: ? More redness, swelling, or pain. ? More fluid or blood. ? Warmth. ? More pus or a bad smell. General instructions  To avoid spreading the infection: ? Do not share personal care items, towels, or hot tubs with others. ? Avoid making skin-to-skin contact with other people.  Keep all follow-up visits as told by your doctor. This is important. Contact a doctor if:  You have more redness, swelling, or pain around your abscess.  You have more fluid or blood coming from your abscess.  Your abscess feels warm when you touch it.  You have more pus or a bad smell coming from your abscess.  You have a fever.  Your muscles ache.  You have chills.  You feel sick. Get help right away if:  You have very bad (severe) pain.  You see red streaks on your skin spreading away from the abscess. Summary  A skin abscess is an infected area of your skin that contains pus and other material.  The abscess is caused by germs that enter the skin through a cut or scrape. It can also be caused by blocked oil and sweat glands or infected hair follicles.  Follow your doctor's instructions on caring for your abscess, taking medicines, preventing infections, and keeping follow-up visits.  This information is not intended to replace advice given to you by your health care provider. Make sure you discuss any questions you have with your health care provider. Document Released: 03/05/2008 Document Revised: 10/31/2017 Document Reviewed: 10/31/2017 Elsevier Interactive Patient Education  [employer].     If you have lab work done today you will be contacted with your lab results within the next 2 weeks.  If you have not heard from [place] then please contact us. The fastest way to get your results is to register for My Chart.   IF you received an x-ray today, you  will receive an invoice from [facility]. Please contact [facility] at [phone] with questions or concerns regarding your invoice.   IF you received labwork today, you will receive an invoice from [facility]. Please contact [facility] at [phone] with questions or concerns regarding your invoice.   Our billing staff will not be able to assist you with questions regarding bills from these companies.  You will be contacted with the lab results as soon as they are available. The fastest way to get your results is to activate your My Chart account. Instructions are located on the last page of this paperwork. If you have not heard from [place] regarding the results in 2 weeks, please contact this office.

## 2018-12-17 ENCOUNTER — Encounter: Payer: Self-pay | Admitting: Family Medicine

## 2019-02-09 ENCOUNTER — Other Ambulatory Visit: Payer: Self-pay

## 2019-02-09 DIAGNOSIS — Z114 Encounter for screening for human immunodeficiency virus [HIV]: Secondary | ICD-10-CM

## 2019-02-09 DIAGNOSIS — Z299 Encounter for prophylactic measures, unspecified: Secondary | ICD-10-CM

## 2019-02-09 DIAGNOSIS — Z139 Encounter for screening, unspecified: Secondary | ICD-10-CM

## 2019-02-17 ENCOUNTER — Encounter: Payer: Self-pay | Admitting: Family Medicine

## 2019-02-17 ENCOUNTER — Ambulatory Visit: Payer: BC Managed Care – PPO | Admitting: Family Medicine

## 2019-02-17 ENCOUNTER — Other Ambulatory Visit: Payer: Self-pay

## 2019-02-17 VITALS — BP 115/75 | HR 98 | Temp 99.1°F | Resp 17 | Ht 68.5 in | Wt 179.6 lb

## 2019-02-17 DIAGNOSIS — Z23 Encounter for immunization: Secondary | ICD-10-CM | POA: Diagnosis not present

## 2019-02-17 DIAGNOSIS — N644 Mastodynia: Secondary | ICD-10-CM | POA: Diagnosis not present

## 2019-02-17 NOTE — Patient Instructions (Addendum)
If you have lab work done today you will be contacted with your lab results within the next 2 weeks.  If you have not heard from [place] then please contact us. The fastest way to get your results is to register for My Chart.   IF you received an x-ray today, you will receive an invoice from [facility]. Please contact [facility] at [phone] with questions or concerns regarding your invoice.   IF you received labwork today, you will receive an invoice from [place]. Please contact [facility] at [phone] with questions or concerns regarding your invoice.   Our billing staff will not be able to assist you with questions regarding bills from these companies.  You will be contacted with the lab results as soon as they are available. The fastest way to get your results is to activate your My Chart account. Instructions are located on the last page of this paperwork. If you have not heard from [place] regarding the results in 2 weeks, please contact this office.     Breast Tenderness Breast tenderness is a common problem for women of all ages. Breast tenderness may cause mild discomfort to severe pain. The pain usually comes and goes in association with your menstrual cycle, but it can be constant. Breast tenderness has many possible causes, including hormone changes and some medicines. Your health care provider may order tests, such as a mammogram or an ultrasound, to check for any unusual findings. Having breast tenderness usually does not mean that you have breast cancer. Follow these instructions at home: Sometimes, reassurance that you do not have breast cancer is all that is needed. In general, follow these home care instructions: Managing pain and discomfort   If directed, apply ice to the area: ? Put ice in a plastic bag. ? Place a towel between your skin and the bag. ? Leave the ice on for 20 minutes, 2-3 times a day.  Make sure you are wearing a supportive bra,  especially during exercise. You may also want to wear a supportive bra while sleeping if your breasts are very tender. Medicines  Take over-the-counter and prescription medicines only as told by your health care provider. If the cause of your pain is infection, you may be prescribed an antibiotic medicine.  If you were prescribed an antibiotic, take it as told by your health care provider. Do not stop taking the antibiotic even if you start to feel better. General instructions   Your health care provider may recommend that you reduce the amount of fat in your diet. You can do this by: ? Limiting fried foods. ? Cooking foods using methods, such as baking, boiling, grilling, and broiling.  Decrease the amount of caffeine in your diet. You can do this by drinking more water and choosing caffeine-free options.  Keep a log of the days and times when your breasts are most tender.  Ask your health care provider how to do breast exams at home. This will help you notice if you have an unusual growth or lump. Contact a health care provider if:  Any part of your breast is hard, red, and hot to the touch. This may be a sign of infection.  You are not breastfeeding and you have fluid, especially blood or pus, coming out of your nipples.  You have a fever.  You have a new or painful lump in your breast that remains after your menstrual period ends.  Your pain does not improve or  it gets worse.  Your pain is interfering with your daily activities. This information is not intended to replace advice given to you by your health care provider. Make sure you discuss any questions you have with your health care provider. Document Released: 08/30/2008 Document Revised: 06/15/2016 Document Reviewed: 06/15/2016 Elsevier Interactive Patient Education  2019 [employer].

## 2019-02-17 NOTE — Progress Notes (Signed)
Established Patient Office Visit  Subjective:  Patient ID: Stacey Key, female    DOB: 08-18-1986  Age: 33 y.o. MRN: [id number]  CC:  Chief Complaint  Patient presents with  . bump near right breast since april    per pt started out super sore but is no longer sore    HPI Stacey Key presents for   A right axillae bump  States that she has lumpy breasts without skin changes or nipple discharge Her maternal and paternal grandmother both had breast cancer and a grandfather with prostate cancer No other symptoms  She is not sexually active  Past Medical History:  Diagnosis Date  . Allergy   . Anemia   . Arthritis    shoulders, knee, hand  . Asthma   . Endometriosis   . GERD (gastroesophageal reflux disease)   . Ovarian cyst   . Vitamin D deficiency     Past Surgical History:  Procedure Laterality Date  . HERNIA REPAIR    . LAPAROSCOPY N/A 08/22/2016   Procedure: LAPAROSCOPY OPERATIVE,excision of posterior culdesac endometriosis;  Surgeon: Eldred Manges, MD;  Location: [facility];  Service: Gynecology;  Laterality: N/A;  . WISDOM TOOTH EXTRACTION      Family History  Problem Relation Age of Onset  . Anemia Mother   . GER disease Mother   . Arthritis Maternal Grandmother   . Hypertension Maternal Grandmother   . Hypertension Maternal Grandfather   . Clotting disorder Maternal Grandfather   . Hypertension Paternal Grandfather   . Other Neg Hx     Social History   Socioeconomic History  . Marital status: Single    Spouse name: Not on file  . Number of children: Not on file  . Years of education: Not on file  . Highest education level: Not on file  Occupational History  . Not on file  Social Needs  . Financial resource strain: Not on file  . Food insecurity:    Worry: Not on file    Inability: Not on file  . Transportation needs:    Medical: Not on file    Non-medical: Not on file  Tobacco Use  . Smoking status: Never Smoker  . Smokeless  tobacco: Never Used  Substance and Sexual Activity  . Alcohol use: No  . Drug use: No  . Sexual activity: Never    Birth control/protection: Injection  Lifestyle  . Physical activity:    Days per week: Not on file    Minutes per session: Not on file  . Stress: Not on file  Relationships  . Social connections:    Talks on phone: Not on file    Gets together: Not on file    Attends religious service: Not on file    Active member of club or organization: Not on file    Attends meetings of clubs or organizations: Not on file    Relationship status: Not on file  . Intimate partner violence:    Fear of current or ex partner: Not on file    Emotionally abused: Not on file    Physically abused: Not on file    Forced sexual activity: Not on file  Other Topics Concern  . Not on file  Social History Narrative  . Not on file    Outpatient Medications Prior to Visit  Medication Sig Dispense Refill  . albuterol (PROVENTIL HFA;VENTOLIN HFA) 108 (90 Base) MCG/ACT inhaler Inhale 2 puffs into the lungs every 6 (six)  hours as needed for wheezing or shortness of breath. 1 Inhaler 6  . beclomethasone (QVAR REDIHALER) 80 MCG/ACT inhaler Inhale 2 puffs 2 (two) times daily into the lungs. 1 Inhaler 2  . cetirizine (ZYRTEC) 10 MG tablet Take 1 tablet (10 mg total) by mouth daily. 30 tablet 11  . Elagolix Sodium (ORILISSA) 150 MG TABS Take by mouth.    . fluticasone (FLONASE) 50 MCG/ACT nasal spray Place 2 sprays into both nostrils daily. 16 g 6  . levonorgestrel (MIRENA) 20 MCG/24HR IUD 1 each by Intrauterine route once.    . butalbital-acetaminophen-caffeine (FIORICET, ESGIC) 50-325-40 MG tablet Take 1-2 tablets by mouth every 6 (six) hours as needed for headache. 12 tablet 0  . doxycycline (VIBRA-TABS) 100 MG tablet Take 1 tablet (100 mg total) by mouth 2 (two) times daily. 20 tablet 0  . meclizine (ANTIVERT) 12.5 MG tablet Take 1 tablet (12.5 mg total) by mouth 3 (three) times daily as needed for  dizziness. 30 tablet 0  . medroxyPROGESTERone (PROVERA) 10 MG tablet Take 30 mg by mouth daily.  3  . montelukast (SINGULAIR) 10 MG tablet Take 1 tablet (10 mg total) at bedtime by mouth. 30 tablet 5  . rizatriptan (MAXALT) 10 MG tablet Take 1 tablet (10 mg total) by mouth as needed for migraine. May repeat in 2 hours if needed 10 tablet 0   No facility-administered medications prior to visit.     No Known Allergies  ROS Review of Systems    Objective:    Physical Exam  Constitutional: She is oriented to person, place, and time. She appears well-developed and well-nourished.  HENT:  Head: Normocephalic and atraumatic.  Cardiovascular: Normal rate, regular rhythm and normal heart sounds.  No murmur heard. Pulmonary/Chest: Effort normal and breath sounds normal. Right breast exhibits no inverted nipple, no nipple discharge, no skin change and no tenderness. Left breast exhibits no inverted nipple, no mass, no nipple discharge, no skin change and no tenderness. Breasts are symmetrical.    Neurological: She is alert and oriented to person, place, and time.  Psychiatric: She has a normal mood and affect. Her behavior is normal. Judgment and thought content normal.    BP 115/75 (BP Location: Right Arm, Patient Position: Sitting, Cuff Size: Normal)   Pulse 98   Temp 99.1 F (37.3 C) (Oral)   Resp 17   Ht 5' 8.5" (1.74 m)   Wt 179 lb 9.6 oz (81.5 kg)   SpO2 100%   BMI 26.91 kg/m  Wt Readings from Last 3 Encounters:  02/17/19 179 lb 9.6 oz (81.5 kg)  12/16/18 177 lb (80.3 kg)  08/12/18 178 lb 3.2 oz (80.8 kg)     Health Maintenance Due  Topic Date Due  . HIV Screening  09/05/2003  . TETANUS/TDAP  09/05/2007    There are no preventive care reminders to display for this patient.  Lab Results  Component Value Date   TSH 2.320 07/04/2018   Lab Results  Component Value Date   WBC 5.2 07/04/2018   HGB 13.7 07/04/2018   HCT 42.2 07/04/2018   MCV 85.5 07/04/2018   PLT  293 08/14/2016   Lab Results  Component Value Date   NA 140 03/16/2014   K 4.4 03/16/2014   CO2 23 03/16/2014   GLUCOSE 93 03/16/2014   BUN 8 03/16/2014   CREATININE 0.80 03/16/2014   BILITOT 0.4 11/30/2012   ALKPHOS 46 11/30/2012   AST 20 11/30/2012   ALT 11 11/30/2012  PROT 7.9 11/30/2012   ALBUMIN 4.0 11/30/2012   CALCIUM 9.2 03/16/2014      Assessment & Plan:   Problem List Items Addressed This Visit    None    Visit Diagnoses    Breast pain, right    -  Primary   Relevant Orders   MM Digital Screening Unilat R     Will evaluate for fibrocystic disease  Advise pt to increase hydration and to also to limit caffeine   No orders of the defined types were placed in this encounter.   Follow-up: No follow-ups on file.    Forrest Moron, MD

## 2019-02-18 ENCOUNTER — Other Ambulatory Visit: Payer: Self-pay | Admitting: Family Medicine

## 2019-02-18 DIAGNOSIS — N644 Mastodynia: Secondary | ICD-10-CM

## 2019-03-09 ENCOUNTER — Ambulatory Visit
Admission: RE | Admit: 2019-03-09 | Discharge: 2019-03-09 | Disposition: A | Discharge status: Home / Self Care | Payer: BC Managed Care – PPO | Source: Ambulatory Visit | Attending: Family Medicine | Admitting: Family Medicine

## 2019-03-09 ENCOUNTER — Other Ambulatory Visit: Payer: Self-pay

## 2019-03-09 DIAGNOSIS — N644 Mastodynia: Secondary | ICD-10-CM

## 2019-11-11 ENCOUNTER — Telehealth (INDEPENDENT_AMBULATORY_CARE_PROVIDER_SITE_OTHER): Payer: BC Managed Care – PPO | Admitting: Family Medicine

## 2019-11-11 ENCOUNTER — Encounter: Payer: Self-pay | Admitting: Family Medicine

## 2019-11-11 ENCOUNTER — Ambulatory Visit: Payer: Self-pay

## 2019-11-11 ENCOUNTER — Other Ambulatory Visit: Payer: Self-pay

## 2019-11-11 VITALS — Temp 98.6°F | Ht 68.0 in | Wt 165.0 lb

## 2019-11-11 DIAGNOSIS — R42 Dizziness and giddiness: Secondary | ICD-10-CM | POA: Diagnosis not present

## 2019-11-11 DIAGNOSIS — F410 Panic disorder [episodic paroxysmal anxiety] without agoraphobia: Secondary | ICD-10-CM | POA: Diagnosis not present

## 2019-11-11 MED ORDER — MECLIZINE HCL 25 MG PO TABS: 25.0000 mg | ORAL_TABLET | Freq: Three times a day (TID) | ORAL | 0 refills | Status: DC | PRN

## 2019-11-11 MED ORDER — CLONAZEPAM 0.5 MG PO TABS: 0.5000 mg | ORAL_TABLET | Freq: Two times a day (BID) | ORAL | 0 refills | Status: DC | PRN

## 2019-11-11 NOTE — Progress Notes (Signed)
Dizziness  Started sat 11/07/2019. Turn head real fast. Came out of no where. Feels like she has to hold onto something on and off.

## 2019-11-11 NOTE — Telephone Encounter (Signed)
Pt. Reports over the weekend she began having dizziness and vertigo. Mainly while up moving around and when bending down. Reports she had "this about a year ago." Has headaches when this happens. States "I was given medicine for it and it helped." Request an appointment. Warm transfer to [facility] in the practice.   Answer Assessment - Initial Assessment Questions 1. DESCRIPTION: "Describe your dizziness."     Vertigo 2. VERTIGO: "Do you feel like either you or the room is spinning or tilting?"      Yes 3. LIGHTHEADED: "Do you feel lightheaded?" (e.g., somewhat faint, woozy, weak upon standing)     Yes 4. SEVERITY: "How bad is it?"  "Can you walk?"   - MILD - Feels unsteady but walking normally.   - MODERATE - Feels very unsteady when walking, but not falling; interferes with normal activities (e.g., school, work) .   - SEVERE - Unable to walk without falling (requires assistance).     Mild 5. ONSET:  "When did the dizziness begin?"     Started over the weekend 6. AGGRAVATING FACTORS: "Does anything make it worse?" (e.g., standing, change in head position)     Worse when moving around 7. CAUSE: "What do you think is causing the dizziness?"     Vertigo 8. RECURRENT SYMPTOM: "Have you had dizziness before?" If so, ask: "When was the last time?" "What happened that time?"     Yes 9. OTHER SYMPTOMS: "Do you have any other symptoms?" (e.g., headache, weakness, numbness, vomiting, earache)     Headache 10. PREGNANCY: "Is there any chance you are pregnant?" "When was your last menstrual period?"       No  Protocols used: DIZZINESS - VERTIGO-A-AH

## 2019-11-11 NOTE — Patient Instructions (Signed)
° ° ° °  If you have lab work done today you will be contacted with your lab results within the next 2 weeks.  If you have not heard from us then please contact us. The fastest way to get your results is to register for My Chart. ° ° °IF you received an x-ray today, you will receive an invoice from [facility]. Please contact [facility] at [phone] with questions or concerns regarding your invoice.  ° °IF you received labwork today, you will receive an invoice from [facility]. Please contact [facility] at [phone] with questions or concerns regarding your invoice.  ° °Our billing staff will not be able to assist you with questions regarding bills from these companies. ° °You will be contacted with the lab results as soon as they are available. The fastest way to get your results is to activate your My Chart account. Instructions are located on the last page of this paperwork. If you have not heard from us regarding the results in 2 weeks, please contact this office. °  ° ° ° °

## 2019-11-11 NOTE — Progress Notes (Signed)
DOXIMITY VIDEO Encounter- SOAP NOTE Established Patient  This telephone encounter was conducted with the patient's (or proxy's) verbal consent via audio telecommunications: yes/no: Yes Patient was instructed to have this encounter in a suitably private space; and to only have persons present to whom they give permission to participate. In addition, patient identity was confirmed by use of name plus two identifiers (DOB and address).  I discussed the limitations, risks, security and privacy concerns of performing an evaluation and management service by telephone and the availability of in person appointments. I also discussed with the patient that there may be a patient responsible charge related to this service. The patient expressed understanding and agreed to proceed.  I spent a total of TIME; 0 MIN TO 60 MIN: 20 minutes talking with the patient or their proxy.  CC: Dizziness  Subjective   Stacey Key is a 34 y.o. established patient. Telephone visit today for  HPI  11/07/19  Positional Dizziness Reports that she got dizzy starting with picking something No nausea, no slurred speech The room seems like it is up and down She states that if she put her head down to type and she would get very dizzy  Panic Attacks She reports that since December she has been having panic attacks She states that it keeps coming at night and she can feel the sense of panic like a wave She does not know what triggered it She works from home In the day time she does not feel stressed   Patient Active Problem List   Diagnosis Date Noted  . Moderate persistent asthma 08/16/2017  . Seasonal allergic rhinitis 08/16/2017  . 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 Dysmenorrhea 08/22/2016  . Arthritis 06/25/2012  . Ovarian cyst, complex 05/05/2012  . GERD (gastroesophageal reflux disease) 11/13/2011    Past Medical History:  Diagnosis Date  . Allergy   . Anemia   .  Arthritis    shoulders, knee, hand  . Asthma   . Endometriosis   . GERD (gastroesophageal reflux disease)   . Ovarian cyst   . Vitamin D deficiency     Current Outpatient Medications  Medication Sig Dispense Refill  . albuterol (PROVENTIL HFA;VENTOLIN HFA) 108 (90 Base) MCG/ACT inhaler Inhale 2 puffs into the lungs every 6 (six) hours as needed for wheezing or shortness of breath. 1 Inhaler 6  . beclomethasone (QVAR REDIHALER) 80 MCG/ACT inhaler Inhale 2 puffs 2 (two) times daily into the lungs. 1 Inhaler 2  . cetirizine (ZYRTEC) 10 MG tablet Take 1 tablet (10 mg total) by mouth daily. 30 tablet 11  . Elagolix Sodium (ORILISSA) 150 MG TABS Take by mouth.    . fluticasone (FLONASE) 50 MCG/ACT nasal spray Place 2 sprays into both nostrils daily. 16 g 6  . levonorgestrel (MIRENA) 20 MCG/24HR IUD 1 each by Intrauterine route once.    . clonazePAM (KLONOPIN) 0.5 MG tablet Take 1 tablet (0.5 mg total) by mouth 2 (two) times daily as needed for anxiety. 30 tablet 0  . meclizine (ANTIVERT) 25 MG tablet Take 1 tablet (25 mg total) by mouth 3 (three) times daily as needed for dizziness. 30 tablet 0   No current facility-administered medications for this visit.    No Known Allergies  Social History   Socioeconomic History  . Marital status: Single    Spouse name: Not on file  . Number of children: Not on file  . Years of education: Not on file  .  Highest education level: Not on file  Occupational History  . Not on file  Tobacco Use  . Smoking status: Never Smoker  . Smokeless tobacco: Never Used  Substance and Sexual Activity  . Alcohol use: No  . Drug use: No  . Sexual activity: Never    Birth control/protection: Injection  Other Topics Concern  . Not on file  Social History Narrative  . Not on file   Social Determinants of Health   Financial Resource Strain:   . Difficulty of Paying Living Expenses: Not on file  Food Insecurity:   . Worried About Charity fundraiser in the  Last Year: Not on file  . Ran Out of Food in the Last Year: Not on file  Transportation Needs:   . Lack of Transportation (Medical): Not on file  . Lack of Transportation (Non-Medical): Not on file  Physical Activity:   . Days of Exercise per Week: Not on file  . Minutes of Exercise per Session: Not on file  Stress:   . Feeling of Stress : Not on file  Social Connections:   . Frequency of Communication with Friends and Family: Not on file  . Frequency of Social Gatherings with Friends and Family: Not on file  . Attends Religious Services: Not on file  . Active Member of Clubs or Organizations: Not on file  . Attends Archivist Meetings: Not on file  . Marital Status: Not on file  Intimate Partner Violence:   . Fear of Current or Ex-Partner: Not on file  . Emotionally Abused: Not on file  . Physically Abused: Not on file  . Sexually Abused: Not on file    ROS Review of Systems  Constitutional: Negative for activity change, appetite change, chills and fever.  HENT: Negative for congestion, nosebleeds, trouble swallowing and voice change.   Respiratory: Negative for cough, shortness of breath and wheezing.   Gastrointestinal: Negative for diarrhea, nausea and vomiting.  Genitourinary: Negative for difficulty urinating, dysuria, flank pain and hematuria.  Musculoskeletal: Negative for back pain, joint swelling and neck pain.  Neurological: see hpi See HPI. All other review of systems negative.   Objective   Vitals as reported by the patient: Today's Vitals   11/11/19 1437  Temp: 98.6 F (37 C)  TempSrc: Oral  Weight: 165 lb (74.8 kg)  Height: 5\' 8"  (1.727 m)    Diagnoses and all orders for this visit:  Dizziness-  Discussed vertigo Advised vestibular rehab Fall precautions reviewed Meclizine for now Clonazepam to help with symptoms -     meclizine (ANTIVERT) 25 MG tablet; Take 1 tablet (25 mg total) by mouth 3 (three) times daily as needed for dizziness. -      Ambulatory referral to Physical Therapy -     clonazePAM (KLONOPIN) 0.5 MG tablet; Take 1 tablet (0.5 mg total) by mouth 2 (two) times daily as needed for anxiety.  Panic attacks - discussed that if her symptoms resolve after vestibular rehab then she does not need daily SSRI If it is unchanged will add SSRI -     clonazePAM (KLONOPIN) 0.5 MG tablet; Take 1 tablet (0.5 mg total) by mouth 2 (two) times daily as needed for anxiety.     I discussed the assessment and treatment plan with the patient. The patient was provided an opportunity to ask questions and all were answered. The patient agreed with the plan and demonstrated an understanding of the instructions.   The patient was advised to  call back or seek an in-person evaluation if the symptoms worsen or if the condition fails to improve as anticipated.  I provided 20 minutes of face-to-face time during this encounter.  Forrest Moron, MD  Primary Care at [facility]

## 2019-11-11 NOTE — Telephone Encounter (Signed)
Pt has a virtual appt with Nolon Rod today @5 :20

## 2019-11-23 ENCOUNTER — Other Ambulatory Visit: Payer: Self-pay | Admitting: Family Medicine

## 2019-11-23 MED ORDER — ALBUTEROL SULFATE HFA 108 (90 BASE) MCG/ACT IN AERS: 2.0000 | INHALATION_SPRAY | Freq: Four times a day (QID) | RESPIRATORY_TRACT | 3 refills | Status: DC | PRN

## 2019-11-23 NOTE — Telephone Encounter (Signed)
Requested medication (s) are due for refill today No  Requested medication (s) are on the active medication list   Yes  Future visit scheduled  No  This medication ordered last on 07/12/17.  Last Visit 11/10/18.  Routing to provider for further consideration.   Requested Prescriptions  Pending Prescriptions Disp Refills   albuterol (VENTOLIN HFA) 108 (90 Base) MCG/ACT inhaler      Sig: Inhale 2 puffs into the lungs every 6 (six) hours as needed for wheezing or shortness of breath.      Pulmonology:  Beta Agonists Failed - 11/23/2019  8:59 AM      Failed - One inhaler should last at least one month. If the patient is requesting refills earlier, contact the patient to check for uncontrolled symptoms.      Passed - Valid encounter within last 12 months    Recent Outpatient Visits           1 week ago Dizziness   [facility] at [place], MD   9 months ago Breast pain, right   [facility] at [place], MD   11 months ago Cutaneous abscess of left upper extremity   [facility] at [facility], Ranell Patrick, MD   1 year ago Dizziness   Primary Care at [facility], Arlie Solomons, MD   1 year ago Other migraine with status migrainosus, intractable   Primary Care at [facility], [place], [place]

## 2019-11-23 NOTE — Telephone Encounter (Signed)
Medication Refill - Medication: albuterol (PROVENTIL HFA;VENTOLIN HFA) 108 (90 Base) MCG/ACT inhaler    Preferred Pharmacy (with phone number or street name):  [id number] - [place], [place] Phone:  [phone]  Fax:  [phone]       Agent: Please be advised that RX refills may take up to 3 business days. We ask that you follow-up with your pharmacy.

## 2019-12-02 ENCOUNTER — Telehealth (INDEPENDENT_AMBULATORY_CARE_PROVIDER_SITE_OTHER): Payer: BC Managed Care – PPO | Admitting: Family Medicine

## 2019-12-02 ENCOUNTER — Other Ambulatory Visit: Payer: Self-pay

## 2019-12-02 VITALS — BP 104/68 | Temp 98.3°F | Ht 68.0 in | Wt 166.0 lb

## 2019-12-02 DIAGNOSIS — R059 Cough, unspecified: Secondary | ICD-10-CM

## 2019-12-02 DIAGNOSIS — R05 Cough: Secondary | ICD-10-CM

## 2019-12-02 DIAGNOSIS — U071 COVID-19: Secondary | ICD-10-CM | POA: Diagnosis not present

## 2019-12-02 MED ORDER — BENZONATATE 100 MG PO CAPS: 100.0000 mg | ORAL_CAPSULE | Freq: Two times a day (BID) | ORAL | 1 refills | Status: DC | PRN

## 2019-12-02 MED ORDER — HYDROCODONE-HOMATROPINE 5-1.5 MG/5ML PO SYRP: 5.0000 mL | ORAL_SOLUTION | Freq: Three times a day (TID) | ORAL | 0 refills | Status: DC | PRN

## 2019-12-02 NOTE — Progress Notes (Signed)
DOXIMITY VIDEO- SOAP NOTE Established Patient  This telephone encounter was conducted with the patient's (or proxy's) verbal consent via audio telecommunications: yes/no: Yes Patient was instructed to have this encounter in a suitably private space; and to only have persons present to whom they give permission to participate. In addition, patient identity was confirmed by use of name plus two identifiers (DOB and address).  I discussed the limitations, risks, security and privacy concerns of performing an evaluation and management service by telephone and the availability of in person appointments. I also discussed with the patient that there may be a patient responsible charge related to this service. The patient expressed understanding and agreed to proceed.  I spent a total of TIME; 0 MIN TO 60 MIN: 15 minutes talking with the patient or their proxy.  Chief Complaint  Patient presents with  . pos for COVID    at her job POS on 11/20/19.     Subjective   Stacey Key is a 34 y.o. established patient. Telephone visit today for  HPI  She reports that she can't smell When she coughs it hurts between her shoulder blades She gets coughing spells, she has a dry cough but she can tell that some of the mucus comes up She reports that it starts off dry but then mucus occasionally comes up Her cough is very forceful She did not get the covid vaccine  She denies fevers   Patient Active Problem List   Diagnosis Date Noted  . Moderate persistent asthma 08/16/2017  . Seasonal allergic rhinitis 08/16/2017  . 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 Dysmenorrhea 08/22/2016  . Arthritis 06/25/2012  . Ovarian cyst, complex 05/05/2012  . GERD (gastroesophageal reflux disease) 11/13/2011    Past Medical History:  Diagnosis Date  . Allergy   . Anemia   . Arthritis    shoulders, knee, hand  . Asthma   . Endometriosis   . GERD (gastroesophageal reflux disease)     . Ovarian cyst   . Vitamin D deficiency     Current Outpatient Medications  Medication Sig Dispense Refill  . albuterol (VENTOLIN HFA) 108 (90 Base) MCG/ACT inhaler Inhale 2 puffs into the lungs every 6 (six) hours as needed for wheezing or shortness of breath. 18 g 3  . beclomethasone (QVAR REDIHALER) 80 MCG/ACT inhaler Inhale 2 puffs 2 (two) times daily into the lungs. 1 Inhaler 2  . cetirizine (ZYRTEC) 10 MG tablet Take 1 tablet (10 mg total) by mouth daily. 30 tablet 11  . clonazePAM (KLONOPIN) 0.5 MG tablet Take 1 tablet (0.5 mg total) by mouth 2 (two) times daily as needed for anxiety. 30 tablet 0  . levonorgestrel (MIRENA) 20 MCG/24HR IUD 1 each by Intrauterine route once.    . meclizine (ANTIVERT) 25 MG tablet Take 1 tablet (25 mg total) by mouth 3 (three) times daily as needed for dizziness. 30 tablet 0  . benzonatate (TESSALON) 100 MG capsule Take 1 capsule (100 mg total) by mouth 2 (two) times daily as needed for cough. 30 capsule 1  . fluticasone (FLONASE) 50 MCG/ACT nasal spray Place 2 sprays into both nostrils daily. (Patient not taking: Reported on 12/02/2019) 16 g 6  . HYDROcodone-homatropine (HYCODAN) 5-1.5 MG/5ML syrup Take 5 mLs by mouth every 8 (eight) hours as needed for cough. 60 mL 0   No current facility-administered medications for this visit.    No Known Allergies  Social History   Socioeconomic History  .  Marital status: Single    Spouse name: Not on file  . Number of children: Not on file  . Years of education: Not on file  . Highest education level: Not on file  Occupational History  . Not on file  Tobacco Use  . Smoking status: Never Smoker  . Smokeless tobacco: Never Used  Substance and Sexual Activity  . Alcohol use: No  . Drug use: No  . Sexual activity: Never    Birth control/protection: Injection  Other Topics Concern  . Not on file  Social History Narrative  . Not on file   Social Determinants of Health   Financial Resource Strain:    . Difficulty of Paying Living Expenses: Not on file  Food Insecurity:   . Worried About Charity fundraiser in the Last Year: Not on file  . Ran Out of Food in the Last Year: Not on file  Transportation Needs:   . Lack of Transportation (Medical): Not on file  . Lack of Transportation (Non-Medical): Not on file  Physical Activity:   . Days of Exercise per Week: Not on file  . Minutes of Exercise per Session: Not on file  Stress:   . Feeling of Stress : Not on file  Social Connections:   . Frequency of Communication with Friends and Family: Not on file  . Frequency of Social Gatherings with Friends and Family: Not on file  . Attends Religious Services: Not on file  . Active Member of Clubs or Organizations: Not on file  . Attends Archivist Meetings: Not on file  . Marital Status: Not on file  Intimate Partner Violence:   . Fear of Current or Ex-Partner: Not on file  . Emotionally Abused: Not on file  . Physically Abused: Not on file  . Sexually Abused: Not on file    ROS  Objective   Vitals as reported by the patient: Today's Vitals   12/02/19 1112  BP: 104/68  Temp: 98.3 F (36.8 C)  TempSrc: Temporal  Weight: 166 lb (75.3 kg)  Height: 5\' 8"  (1.727 m)    [name] was seen today for pos for covid.  Diagnoses and all orders for this visit:  COVID-19  Cough  Other orders -     benzonatate (TESSALON) 100 MG capsule; Take 1 capsule (100 mg total) by mouth 2 (two) times daily as needed for cough. -     HYDROcodone-homatropine (HYCODAN) 5-1.5 MG/5ML syrup; Take 5 mLs by mouth every 8 (eight) hours as needed for cough.    Return to work letter provided '   I discussed the assessment and treatment plan with the patient. The patient was provided an opportunity to ask questions and all were answered. The patient agreed with the plan and demonstrated an understanding of the instructions.   The patient was advised to call back or seek an in-person  evaluation if the symptoms worsen or if the condition fails to improve as anticipated.  I provided 15 minutes of non-face-to-face time during this encounter.  Forrest Moron, MD  Primary Care at [facility]

## 2019-12-02 NOTE — Progress Notes (Signed)
pos for COVID  at her job POS on 11/20/19.  And Job stated she needed to do a 10 day quarantine.    Got tested to be tested 11/18/19 No sx during test time.   Sx: stated Saturday 11/21/19 Later had Lost of small Body-aches Cough Congestion  And currently she has a bad cough.   Plan: RELEASED TO GO BACK TO WORK.

## 2019-12-03 ENCOUNTER — Encounter: Payer: Self-pay | Admitting: Family Medicine

## 2019-12-09 NOTE — Telephone Encounter (Signed)
Pt still has cough but is now productive pt wants to know if she should continue the cough syrup you gave to her. Please advise thank you.

## 2019-12-23 ENCOUNTER — Telehealth (INDEPENDENT_AMBULATORY_CARE_PROVIDER_SITE_OTHER): Payer: BC Managed Care – PPO | Admitting: Family Medicine

## 2019-12-23 ENCOUNTER — Other Ambulatory Visit: Payer: Self-pay

## 2019-12-23 DIAGNOSIS — R05 Cough: Secondary | ICD-10-CM | POA: Diagnosis not present

## 2019-12-23 DIAGNOSIS — J4540 Moderate persistent asthma, uncomplicated: Secondary | ICD-10-CM

## 2019-12-23 DIAGNOSIS — R059 Cough, unspecified: Secondary | ICD-10-CM

## 2019-12-23 MED ORDER — PREDNISONE 20 MG PO TABS: 40.0000 mg | ORAL_TABLET | Freq: Every day | ORAL | 0 refills | Status: AC

## 2019-12-23 NOTE — Progress Notes (Signed)
VIDEO Encounter- SOAP NOTE Established Patient  This telephone encounter was conducted with the patient's (or proxy's) verbal consent via audio telecommunications: yes/no: Yes Patient was instructed to have this encounter in a suitably private space; and to only have persons present to whom they give permission to participate. In addition, patient identity was confirmed by use of name plus two identifiers (DOB and address).  I discussed the limitations, risks, security and privacy concerns of performing an evaluation and management service by telephone and the availability of in person appointments. I also discussed with the patient that there may be a patient responsible charge related to this service. The patient expressed understanding and agreed to proceed.  I spent a total of TIME; 0 MIN TO 60 MIN: 15 minutes talking with the patient or their proxy.  Chief Complaint  Patient presents with  . continued cough after covid    per pt continues to have cough that is worse in the mornings and periodically during the day.  Per pt drainage from cough is yellowish.  Not taking any otc med for cough.  . Medication Refill    flonase    Subjective   Stacey Key is a 34 y.o. established patient. Telephone visit today for  HPI  Pt reports that she has been coughing since her COVID diagnosis She denies shortness of breath She reports that the cough is mostly dry She states that she gets a cough that becomes very forceful She tried the hydrocodone medication which she states seemed to help when taken with the pills.  No fevers or chills She is already on zyrtec   Patient Active Problem List   Diagnosis Date Noted  . Moderate persistent asthma 08/16/2017  . Seasonal allergic rhinitis 08/16/2017  . 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 Dysmenorrhea 08/22/2016  . Arthritis 06/25/2012  . Ovarian cyst, complex 05/05/2012  . GERD (gastroesophageal reflux disease)  11/13/2011    Past Medical History:  Diagnosis Date  . Allergy   . Anemia   . Arthritis    shoulders, knee, hand  . Asthma   . Endometriosis   . GERD (gastroesophageal reflux disease)   . Ovarian cyst   . Vitamin D deficiency     Current Outpatient Medications  Medication Sig Dispense Refill  . albuterol (VENTOLIN HFA) 108 (90 Base) MCG/ACT inhaler Inhale 2 puffs into the lungs every 6 (six) hours as needed for wheezing or shortness of breath. 18 g 3  . beclomethasone (QVAR REDIHALER) 80 MCG/ACT inhaler Inhale 2 puffs 2 (two) times daily into the lungs. 1 Inhaler 2  . benzonatate (TESSALON) 100 MG capsule Take 1 capsule (100 mg total) by mouth 2 (two) times daily as needed for cough. 30 capsule 1  . cetirizine (ZYRTEC) 10 MG tablet Take 1 tablet (10 mg total) by mouth daily. 30 tablet 11  . clonazePAM (KLONOPIN) 0.5 MG tablet Take 1 tablet (0.5 mg total) by mouth 2 (two) times daily as needed for anxiety. 30 tablet 0  . levonorgestrel (MIRENA) 20 MCG/24HR IUD 1 each by Intrauterine route once.    . meclizine (ANTIVERT) 25 MG tablet Take 1 tablet (25 mg total) by mouth 3 (three) times daily as needed for dizziness. 30 tablet 0  . fluticasone (FLONASE) 50 MCG/ACT nasal spray Place 2 sprays into both nostrils daily. (Patient not taking: Reported on 12/02/2019) 16 g 6  . HYDROcodone-homatropine (HYCODAN) 5-1.5 MG/5ML syrup Take 5 mLs by mouth every 8 (  eight) hours as needed for cough. (Patient not taking: Reported on 12/23/2019) 60 mL 0  . predniSONE (DELTASONE) 20 MG tablet Take 2 tablets (40 mg total) by mouth daily with breakfast for 5 days. 10 tablet 0   No current facility-administered medications for this visit.    No Known Allergies  Social History   Socioeconomic History  . Marital status: Single    Spouse name: Not on file  . Number of children: Not on file  . Years of education: Not on file  . Highest education level: Not on file  Occupational History  . Not on file    Tobacco Use  . Smoking status: Never Smoker  . Smokeless tobacco: Never Used  Substance and Sexual Activity  . Alcohol use: No  . Drug use: No  . Sexual activity: Never    Birth control/protection: Injection  Other Topics Concern  . Not on file  Social History Narrative  . Not on file   Social Determinants of Health   Financial Resource Strain:   . Difficulty of Paying Living Expenses:   Food Insecurity:   . Worried About Charity fundraiser in the Last Year:   . Arboriculturist in the Last Year:   Transportation Needs:   . Film/video editor (Medical):   Marland Kitchen Lack of Transportation (Non-Medical):   Physical Activity:   . Days of Exercise per Week:   . Minutes of Exercise per Session:   Stress:   . Feeling of Stress :   Social Connections:   . Frequency of Communication with Friends and Family:   . Frequency of Social Gatherings with Friends and Family:   . Attends Religious Services:   . Active Member of Clubs or Organizations:   . Attends Archivist Meetings:   Marland Kitchen Marital Status:   Intimate Partner Violence:   . Fear of Current or Ex-Partner:   . Emotionally Abused:   Marland Kitchen Physically Abused:   . Sexually Abused:     ROS See hpi Objective   Vitals as reported by the patient: There were no vitals filed for this visit.  Physical Exam  Constitutional: Oriented to person, place, and time. Appears well-developed and well-nourished.  HENT:  Head: Normocephalic and atraumatic.  Eyes: Conjunctivae and EOM are normal.  Pulmonary/Chest: Effort normal +frequent coughing throughout exam  Neurological: Is alert and oriented to person, place, and time.  Skin: Skin is warm. Capillary refill takes less than 2 seconds.  Psychiatric: Has a normal mood and affect. Behavior is normal. Judgment and thought content normal.    Stacey Key was seen today for continued cough after covid and medication refill.  Diagnoses and all orders for this visit:  Moderate persistent  asthma without complication -     predniSONE (DELTASONE) 20 MG tablet; Take 2 tablets (40 mg total) by mouth daily with breakfast for 5 days.  Cough   -  Discussed persistent coughing -  Will use steroid burst because of concern about asthma exacerbation -  appt made for 12/25/19 at 5:30pm at [facility]  I discussed the assessment and treatment plan with the patient. The patient was provided an opportunity to ask questions and all were answered. The patient agreed with the plan and demonstrated an understanding of the instructions.   The patient was advised to call back or seek an in-person evaluation if the symptoms worsen or if the condition fails to improve as anticipated.  I provided 15 minutes of  VIDEO face-to-face time during this encounter.  Forrest Moron, MD  Primary Care at [facility]

## 2019-12-23 NOTE — Patient Instructions (Signed)
° ° ° °  If you have lab work done today you will be contacted with your lab results within the next 2 weeks.  If you have not heard from us then please contact us. The fastest way to get your results is to register for My Chart. ° ° °IF you received an x-ray today, you will receive an invoice from [facility]. Please contact [facility] at [phone] with questions or concerns regarding your invoice.  ° °IF you received labwork today, you will receive an invoice from [facility]. Please contact [facility] at [phone] with questions or concerns regarding your invoice.  ° °Our billing staff will not be able to assist you with questions regarding bills from these companies. ° °You will be contacted with the lab results as soon as they are available. The fastest way to get your results is to activate your My Chart account. Instructions are located on the last page of this paperwork. If you have not heard from us regarding the results in 2 weeks, please contact this office. °  ° ° ° °

## 2019-12-28 ENCOUNTER — Telehealth: Payer: Self-pay | Admitting: Family Medicine

## 2019-12-28 NOTE — Telephone Encounter (Signed)
Pt is calling regarding her needing to resch this appt that was for 12/25/19 at 5:30pm at the [facility]. Pt didn't have the location or the number to give them a call. Pt is requesting the number to give them a call to rech. [phone]. Please advise.

## 2020-01-02 ENCOUNTER — Ambulatory Visit: Payer: BC Managed Care – PPO | Attending: Internal Medicine

## 2020-01-02 DIAGNOSIS — Z23 Encounter for immunization: Secondary | ICD-10-CM

## 2020-01-02 NOTE — Progress Notes (Signed)
   Covid-19 Vaccination Clinic  Name:  Stacey Key    MRN: [id number] DOB: 1986-08-14  01/02/2020  Stacey Key was observed post Covid-19 immunization for 15 minutes without incident. She was provided with Vaccine Information Sheet and instruction to access the V-Safe system.   Stacey Key was instructed to call 911 with any severe reactions post vaccine: Marland Kitchen Difficulty breathing  . Swelling of face and throat  . A fast heartbeat  . A bad rash all over body  . Dizziness and weakness   Immunizations Administered    Name Date Dose VIS Date Route   Pfizer COVID-19 Vaccine 01/02/2020 11:48 AM 0.3 mL 09/11/2019 Intramuscular   Manufacturer: [place]   Lot: [id number]   [place]: [id number]

## 2020-01-04 NOTE — Telephone Encounter (Signed)
Pt called back for an update on the appt she had at [facility]. Pt stated she was told she would receive a call back but has not heard back from anyone. Pt requests call back. Cb# [phone]

## 2020-01-13 ENCOUNTER — Ambulatory Visit (INDEPENDENT_AMBULATORY_CARE_PROVIDER_SITE_OTHER): Payer: BC Managed Care – PPO | Admitting: Nurse Practitioner

## 2020-01-13 ENCOUNTER — Other Ambulatory Visit: Payer: Self-pay

## 2020-01-13 ENCOUNTER — Ambulatory Visit (HOSPITAL_COMMUNITY)
Admission: RE | Admit: 2020-01-13 | Discharge: 2020-01-13 | Disposition: A | Discharge status: Home / Self Care | Payer: BC Managed Care – PPO | Source: Ambulatory Visit | Attending: Nurse Practitioner | Admitting: Nurse Practitioner

## 2020-01-13 VITALS — BP 118/78 | HR 94 | Temp 97.8°F | Ht 68.0 in | Wt 176.8 lb

## 2020-01-13 DIAGNOSIS — R062 Wheezing: Secondary | ICD-10-CM | POA: Diagnosis present

## 2020-01-13 DIAGNOSIS — Z8616 Personal history of COVID-19: Secondary | ICD-10-CM | POA: Insufficient documentation

## 2020-01-13 DIAGNOSIS — R05 Cough: Secondary | ICD-10-CM

## 2020-01-13 DIAGNOSIS — Z8709 Personal history of other diseases of the respiratory system: Secondary | ICD-10-CM | POA: Diagnosis not present

## 2020-01-13 DIAGNOSIS — R059 Cough, unspecified: Secondary | ICD-10-CM

## 2020-01-13 MED ORDER — FLUTICASONE PROPIONATE 50 MCG/ACT NA SUSP: 2.0000 | Freq: Every day | NASAL | 6 refills | Status: DC

## 2020-01-13 MED ORDER — BENZONATATE 200 MG PO CAPS: 200.0000 mg | ORAL_CAPSULE | Freq: Two times a day (BID) | ORAL | 0 refills | Status: DC | PRN

## 2020-01-13 MED ORDER — ALBUTEROL SULFATE (2.5 MG/3ML) 0.083% IN NEBU: 2.5000 mg | INHALATION_SOLUTION | Freq: Four times a day (QID) | RESPIRATORY_TRACT | 1 refills | Status: DC | PRN

## 2020-01-13 MED ORDER — BUDESONIDE-FORMOTEROL FUMARATE 80-4.5 MCG/ACT IN AERO: 2.0000 | INHALATION_SPRAY | Freq: Two times a day (BID) | RESPIRATORY_TRACT | 3 refills | Status: DC

## 2020-01-13 NOTE — Patient Instructions (Signed)
History of Covid History of Asthma Cough:  Will order Symbicort 2 puffs twice daily  May continue albuterol as needed  Will order albuterol nebulizer to use as needed  Will order labs  Will order chest x ray   Stay active  Cough: -Continue gastroesophageal reflux disease treatment with elevating the head your bed and taking antacids  -Continue taking over-the-counter antihistamines and nasal fluticasone to help with allergic rhinitis  -You need to try to suppress your cough to allow your larynx (voice box) to heal.  For three days don't talk, laugh, sing, or clear your throat. Do everything you can to suppress the cough during this time.  -Use hard candies (sugarless Jolly Ranchers) or non-mint or non-menthol containing cough drops during this time to soothe your throat.  Use a cough suppressant (Delsym or what I have prescribed you) around the clock during this time.   - After three days, gradually increase the use of your voice and back off on the cough suppressants.   Will place referral to pulmonary for further evaluation   Loss of Smell: Handout given on [facility] otolaryngology department olfactory re-training program  Follow up in 4 weeks or sooner if needed

## 2020-01-13 NOTE — Progress Notes (Signed)
@Patient  ID: Stacey Key, female    DOB: April 05, 1986, 34 y.o.   MRN: [id number]  Chief Complaint  Patient presents with  . Post COVID    Tested postive 11/2018. Still having a lingering cough and unable to smell.     Referring provider: Forrest Moron, MD   34 year old female with history of asthma, seasonal allergic rhinitis, GERD, anemia. Diagnosed with Covid February 2021.   HPI  Patient presents today for post Covid care clinic visit.  Patient was diagnosed with Covid February.  She states that at first she did not have any symptoms but shortly after being diagnosed she developed cough and lost her sense of taste and smell.  She states that her PCP did prescribe a round of prednisone and some cough medication for her.  She states that this has not helped relieve symptoms.  She still has persistent nonproductive cough.  She states that she does wheeze at times.  Patient does have a history of asthma.  She states that she has not been taking her Qvar.  She does use albuterol as needed.  She states that she does take Zyrtec.  She has not been using her Flonase. Denies f/c/s, n/v/d, hemoptysis, PND, chest pain or edema.       No Known Allergies  Immunization History  Administered Date(s) Administered  . PFIZER SARS-COV-2 Vaccination 01/02/2020  . PPD Test 05/13/2012  . Tdap 02/17/2019    Past Medical History:  Diagnosis Date  . Allergy   . Anemia   . Arthritis    shoulders, knee, hand  . Asthma   . Endometriosis   . GERD (gastroesophageal reflux disease)   . Ovarian cyst   . Vitamin D deficiency     Tobacco History: Social History   Tobacco Use  Smoking Status Never Smoker  Smokeless Tobacco Never Used   Counseling given: Yes   Outpatient Encounter Medications as of 01/13/2020  Medication Sig  . albuterol (VENTOLIN HFA) 108 (90 Base) MCG/ACT inhaler Inhale 2 puffs into the lungs every 6 (six) hours as needed for wheezing or shortness of breath.  .  beclomethasone (QVAR REDIHALER) 80 MCG/ACT inhaler Inhale 2 puffs 2 (two) times daily into the lungs.  . cetirizine (ZYRTEC) 10 MG tablet Take 1 tablet (10 mg total) by mouth daily.  . clonazePAM (KLONOPIN) 0.5 MG tablet Take 1 tablet (0.5 mg total) by mouth 2 (two) times daily as needed for anxiety.  Marland Kitchen levonorgestrel (MIRENA) 20 MCG/24HR IUD 1 each by Intrauterine route once.  Marland Kitchen albuterol (PROVENTIL) (2.5 MG/3ML) 0.083% nebulizer solution Take 3 mLs (2.5 mg total) by nebulization every 6 (six) hours as needed for wheezing or shortness of breath.  . benzonatate (TESSALON) 200 MG capsule Take 1 capsule (200 mg total) by mouth 2 (two) times daily as needed for cough.  . budesonide-formoterol (SYMBICORT) 80-4.5 MCG/ACT inhaler Inhale 2 puffs into the lungs 2 (two) times daily.  . fluticasone (FLONASE) 50 MCG/ACT nasal spray Place 2 sprays into both nostrils daily.  Marland Kitchen HYDROcodone-homatropine (HYCODAN) 5-1.5 MG/5ML syrup Take 5 mLs by mouth every 8 (eight) hours as needed for cough. (Patient not taking: Reported on 12/23/2019)  . meclizine (ANTIVERT) 25 MG tablet Take 1 tablet (25 mg total) by mouth 3 (three) times daily as needed for dizziness. (Patient not taking: Reported on 01/13/2020)  . [DISCONTINUED] benzonatate (TESSALON) 100 MG capsule Take 1 capsule (100 mg total) by mouth 2 (two) times daily as needed for cough. (Patient  not taking: Reported on 01/13/2020)  . [DISCONTINUED] fluticasone (FLONASE) 50 MCG/ACT nasal spray Place 2 sprays into both nostrils daily. (Patient not taking: Reported on 12/02/2019)   No facility-administered encounter medications on file as of 01/13/2020.     Review of Systems  Review of Systems  Constitutional: Negative.  Negative for chills and fever.  HENT: Negative.        Loss of taste and smell   Respiratory: Positive for cough and wheezing. Negative for shortness of breath.   Cardiovascular: Negative.  Negative for chest pain, palpitations and leg swelling.   Gastrointestinal: Negative.   Allergic/Immunologic: Negative.   Neurological: Negative.   Psychiatric/Behavioral: Negative.        Physical Exam  BP 118/78 (BP Location: Left Arm, Patient Position: Sitting, Cuff Size: Small)   Pulse 94   Temp 97.8 F (36.6 C)   Ht 5\' 8"  (1.727 m)   Wt 176 lb 12 oz (80.2 kg)   LMP 12/23/2019   SpO2 99%   BMI 26.87 kg/m   Wt Readings from Last 5 Encounters:  01/13/20 176 lb 12 oz (80.2 kg)  12/02/19 166 lb (75.3 kg)  11/11/19 165 lb (74.8 kg)  02/17/19 179 lb 9.6 oz (81.5 kg)  12/16/18 177 lb (80.3 kg)     Physical Exam Vitals and nursing note reviewed.  Constitutional:      General: She is not in acute distress.    Appearance: She is well-developed.  Cardiovascular:     Rate and Rhythm: Normal rate and regular rhythm.  Pulmonary:     Effort: Pulmonary effort is normal. No respiratory distress.     Breath sounds: Normal breath sounds. No wheezing or rhonchi.  Neurological:     Mental Status: She is alert and oriented to person, place, and time.  Psychiatric:        Mood and Affect: Mood normal.        Behavior: Behavior normal.      Imaging: DG Chest 2 View  Result Date: 01/13/2020 CLINICAL DATA:  History of COVID-19 positivity with persistent cough and loss of smell EXAM: CHEST - 2 VIEW COMPARISON:  09/26/2017 FINDINGS: Cardiac shadow is within normal limits. The lungs are well aerated bilaterally. No focal infiltrate or sizable effusion is seen. No acute bony abnormality is noted. IMPRESSION: No acute abnormality seen. Electronically Signed   By: Inez Catalina M.D.   On: 01/13/2020 23:46     Assessment & Plan:   History of COVID-19 History of Asthma Wheeze Cough:  Will order Symbicort 2 puffs twice daily  May continue albuterol as needed  Will order albuterol nebulizer to use as needed  Will order labs  Will order chest x ray   Stay active  Cough: -Continue gastroesophageal reflux disease treatment with  elevating the head your bed and taking antacids  -Continue taking over-the-counter antihistamines and nasal fluticasone to help with allergic rhinitis  -You need to try to suppress your cough to allow your larynx (voice box) to heal.  For three days don't talk, laugh, sing, or clear your throat. Do everything you can to suppress the cough during this time.  -Use hard candies (sugarless Jolly Ranchers) or non-mint or non-menthol containing cough drops during this time to soothe your throat.  Use a cough suppressant (Delsym or what I have prescribed you) around the clock during this time.   - After three days, gradually increase the use of your voice and back off on the cough suppressants.  Will place referral to pulmonary for further evaluation   Loss of Smell: Handout given on [facility] otolaryngology department olfactory re-training program  Follow up in 4 weeks or sooner if needed       Fenton Foy, NP 01/14/2020

## 2020-01-14 DIAGNOSIS — R059 Cough, unspecified: Secondary | ICD-10-CM | POA: Insufficient documentation

## 2020-01-14 DIAGNOSIS — R062 Wheezing: Secondary | ICD-10-CM

## 2020-01-14 DIAGNOSIS — Z8709 Personal history of other diseases of the respiratory system: Secondary | ICD-10-CM | POA: Insufficient documentation

## 2020-01-14 DIAGNOSIS — Z8616 Personal history of COVID-19: Secondary | ICD-10-CM | POA: Insufficient documentation

## 2020-01-14 DIAGNOSIS — R05 Cough: Secondary | ICD-10-CM | POA: Insufficient documentation

## 2020-01-14 HISTORY — DX: Wheezing: R06.2

## 2020-01-14 HISTORY — DX: Personal history of other diseases of the respiratory system: Z87.09

## 2020-01-14 HISTORY — DX: Personal history of COVID-19: Z86.16

## 2020-01-14 HISTORY — DX: Cough, unspecified: R05.9

## 2020-01-14 LAB — COMPREHENSIVE METABOLIC PANEL
ALT: 8 IU/L (ref 0–32)
AST: 15 IU/L (ref 0–40)
Albumin/Globulin Ratio: 1.5 (ref 1.2–2.2)
Albumin: 4.4 g/dL (ref 3.8–4.8)
Alkaline Phosphatase: 59 IU/L (ref 39–117)
BUN/Creatinine Ratio: 13 (ref 9–23)
BUN: 11 mg/dL (ref 6–20)
Bilirubin Total: <0.2 mg/dL (ref 0.0–1.2)
CO2: 26 mmol/L (ref 20–29)
Calcium: 9.6 mg/dL (ref 8.7–10.2)
Chloride: 102 mmol/L (ref 96–106)
Creatinine, Ser: 0.83 mg/dL (ref 0.57–1.00)
GFR calc Af Amer: 109 mL/min/{1.73_m2} (ref >59)
GFR calc non Af Amer: 94 mL/min/{1.73_m2} (ref >59)
Globulin, Total: 2.9 g/dL (ref 1.5–4.5)
Glucose: 85 mg/dL (ref 65–99)
Potassium: 4.7 mmol/L (ref 3.5–5.2)
Sodium: 140 mmol/L (ref 134–144)
Total Protein: 7.3 g/dL (ref 6.0–8.5)

## 2020-01-14 LAB — CBC WITH DIFFERENTIAL/PLATELET
Basophils Absolute: 0.1 10*3/uL (ref 0.0–0.2)
Basos: 1 %
EOS (ABSOLUTE): 0.1 10*3/uL (ref 0.0–0.4)
Eos: 2 %
Hematocrit: 38.7 % (ref 34.0–46.6)
Hemoglobin: 12.5 g/dL (ref 11.1–15.9)
Immature Grans (Abs): 0 10*3/uL (ref 0.0–0.1)
Immature Granulocytes: 0 %
Lymphocytes Absolute: 2.5 10*3/uL (ref 0.7–3.1)
Lymphs: 42 %
MCH: 28.7 pg (ref 26.6–33.0)
MCHC: 32.3 g/dL (ref 31.5–35.7)
MCV: 89 fL (ref 79–97)
Monocytes Absolute: 0.7 10*3/uL (ref 0.1–0.9)
Monocytes: 12 %
Neutrophils Absolute: 2.5 10*3/uL (ref 1.4–7.0)
Neutrophils: 43 %
Platelets: 309 10*3/uL (ref 150–450)
RBC: 4.35 x10E6/uL (ref 3.77–5.28)
RDW: 11.6 % — ABNORMAL LOW (ref 11.7–15.4)
WBC: 5.9 10*3/uL (ref 3.4–10.8)

## 2020-01-14 NOTE — Assessment & Plan Note (Addendum)
History of Asthma Wheeze Cough:  Will order Symbicort 2 puffs twice daily  May continue albuterol as needed  Will order albuterol nebulizer to use as needed  Will order labs  Will order chest x ray   Stay active  Cough: -Continue gastroesophageal reflux disease treatment with elevating the head your bed and taking antacids  -Continue taking over-the-counter antihistamines and nasal fluticasone to help with allergic rhinitis  -You need to try to suppress your cough to allow your larynx (voice box) to heal.  For three days don't talk, laugh, sing, or clear your throat. Do everything you can to suppress the cough during this time.  -Use hard candies (sugarless Jolly Ranchers) or non-mint or non-menthol containing cough drops during this time to soothe your throat.  Use a cough suppressant (Delsym or what I have prescribed you) around the clock during this time.   - After three days, gradually increase the use of your voice and back off on the cough suppressants.   Will place referral to pulmonary for further evaluation   Loss of Smell: Handout given on [facility] otolaryngology department olfactory re-training program  Follow up in 4 weeks or sooner if needed

## 2020-01-15 NOTE — Progress Notes (Signed)
Patient called back, results given. Verbally understood. No additional questions

## 2020-01-15 NOTE — Progress Notes (Signed)
Left voice mail to call back 

## 2020-01-27 ENCOUNTER — Ambulatory Visit: Payer: BC Managed Care – PPO | Attending: Internal Medicine

## 2020-01-27 DIAGNOSIS — Z23 Encounter for immunization: Secondary | ICD-10-CM

## 2020-01-27 NOTE — Progress Notes (Signed)
   Covid-19 Vaccination Clinic  Name:  Stacey Key    MRN: [id number] DOB: 14-May-1986  01/27/2020  [name] was observed post Covid-19 immunization for 15 minutes without incident. She was provided with Vaccine Information Sheet and instruction to access the V-Safe system.   [name] was instructed to call 911 with any severe reactions post vaccine: Marland Kitchen Difficulty breathing  . Swelling of face and throat  . A fast heartbeat  . A bad rash all over body  . Dizziness and weakness   Immunizations Administered    Name Date Dose VIS Date Route   Pfizer COVID-19 Vaccine 01/27/2020  8:41 AM 0.3 mL 11/25/2018 Intramuscular   Manufacturer: [place]   Lot: [id number]   [place]: [id number]

## 2020-02-08 ENCOUNTER — Encounter: Payer: Self-pay | Admitting: *Deleted

## 2020-02-08 ENCOUNTER — Other Ambulatory Visit (INDEPENDENT_AMBULATORY_CARE_PROVIDER_SITE_OTHER): Payer: BC Managed Care – PPO

## 2020-02-08 ENCOUNTER — Other Ambulatory Visit: Payer: Self-pay

## 2020-02-08 ENCOUNTER — Encounter: Payer: Self-pay | Admitting: Critical Care Medicine

## 2020-02-08 ENCOUNTER — Ambulatory Visit: Payer: BC Managed Care – PPO | Admitting: Critical Care Medicine

## 2020-02-08 VITALS — BP 120/62 | HR 97 | Temp 98.6°F | Ht 68.0 in | Wt 170.6 lb

## 2020-02-08 DIAGNOSIS — Z8616 Personal history of COVID-19: Secondary | ICD-10-CM

## 2020-02-08 DIAGNOSIS — R053 Chronic cough: Secondary | ICD-10-CM

## 2020-02-08 DIAGNOSIS — J4540 Moderate persistent asthma, uncomplicated: Secondary | ICD-10-CM | POA: Diagnosis not present

## 2020-02-08 DIAGNOSIS — R05 Cough: Secondary | ICD-10-CM | POA: Diagnosis not present

## 2020-02-08 DIAGNOSIS — K219 Gastro-esophageal reflux disease without esophagitis: Secondary | ICD-10-CM

## 2020-02-08 LAB — CBC WITH DIFFERENTIAL/PLATELET
Basophils Absolute: 0 10*3/uL (ref 0.0–0.1)
Basophils Relative: 1 % (ref 0.0–3.0)
Eosinophils Absolute: 0 10*3/uL (ref 0.0–0.7)
Eosinophils Relative: 0.8 % (ref 0.0–5.0)
HCT: 41.1 % (ref 36.0–46.0)
Hemoglobin: 13.3 g/dL (ref 12.0–15.0)
Lymphocytes Relative: 37.8 % (ref 12.0–46.0)
Lymphs Abs: 1.8 10*3/uL (ref 0.7–4.0)
MCHC: 32.3 g/dL (ref 30.0–36.0)
MCV: 87.9 fl (ref 78.0–100.0)
Monocytes Absolute: 0.5 10*3/uL (ref 0.1–1.0)
Monocytes Relative: 10.9 % (ref 3.0–12.0)
Neutro Abs: 2.4 10*3/uL (ref 1.4–7.7)
Neutrophils Relative %: 49.5 % (ref 43.0–77.0)
Platelets: 305 10*3/uL (ref 150.0–400.0)
RBC: 4.67 Mil/uL (ref 3.87–5.11)
RDW: 12.6 % (ref 11.5–15.5)
WBC: 4.8 10*3/uL (ref 4.0–10.5)

## 2020-02-08 MED ORDER — OMEPRAZOLE 20 MG PO CPDR: 20.0000 mg | DELAYED_RELEASE_CAPSULE | Freq: Every day | ORAL | 11 refills | Status: DC

## 2020-02-08 MED ORDER — MONTELUKAST SODIUM 10 MG PO TABS: 10.0000 mg | ORAL_TABLET | Freq: Every day | ORAL | 11 refills | Status: DC

## 2020-02-08 NOTE — Patient Instructions (Addendum)
Thank you for visiting Dr. Carlis Abbott at [facility]. We recommend the following: Orders Placed This Encounter  Procedures  . IgE  . CBC w/Diff  . Pulmonary function test   Orders Placed This Encounter  Procedures  . IgE    Standing Status:   Future    Standing Expiration Date:   02/07/2021  . CBC w/Diff    Standing Status:   Future    Standing Expiration Date:   02/07/2021  . Pulmonary function test    Standing Status:   Future    Standing Expiration Date:   02/07/2021    Order Specific Question:   Where should this test be performed?    Answer:   [facility]    Order Specific Question:   Full PFT: includes the following: basic spirometry, spirometry pre & post bronchodilator, diffusion capacity (DLCO), lung volumes    Answer:   Full PFT    Meds ordered this encounter  Medications  . montelukast (SINGULAIR) 10 MG tablet    Sig: Take 1 tablet (10 mg total) by mouth at bedtime.    Dispense:  30 tablet    Refill:  11  . omeprazole (PRILOSEC) 20 MG capsule    Sig: Take 1 capsule (20 mg total) by mouth daily.    Dispense:  30 capsule    Refill:  11    Return in about 4 weeks (around 03/07/2020). after PFTs    Please do your part to reduce the spread of COVID-19.

## 2020-02-08 NOTE — Progress Notes (Signed)
Synopsis: Referred in May 2021 for post Covid by Fenton Foy, NP.  Subjective:   PATIENT ID: Stacey Key GENDER: female DOB: 1986-04-19, MRN: [id number]  Chief Complaint  Patient presents with  . Consult    patient complains of SOB and whezzing with activity, dry cough,     Stacey Key is a 34 year old woman who presents for evaluation of chronic cough.  She has a history of asthma that has been worse recently since having Covid in February 2021.  Her cough began at the same time at the end of her week of quarantine.  She tried cough syrup at the time, which did not help.  She has dyspnea on exertion and some wheezing, but no nighttime symptoms.  Her cough is worse in the morning, usually dry.  Her symptoms have made it hard for her to sing and dance at her church and do her usual activities.  Her office (place Chancellor's office at [facility]) has been keeping her away due to concern for her cough being contagious.  Her PCP recently escalated her asthma management from Qvar to Symbicort.  She was diagnosed with asthma about 4 years ago.  She has been missing multiple doses of her Symbicort recently due to multiple family deaths.  She has chronic GERD, but has not been on medication for this recently due to being unable to get a refill.  She gets heartburn symptoms about 3 days/week.  She frequently drinks carbonated beverages, but does not drink much caffeine.  She eats large meals late in the evening.  She takes Zyrtec and Flonase for allergic rhinosinusitis, which has not been well controlled recently.  She has been having nasal congestion, headaches, itchy eyes.  She has never been on Singulair in the past.  Her family history is notable for asthma in a maternal aunt.  She has never smoked or vaped.  ACT 15/25     Past Medical History:  Diagnosis Date  . Allergy   . Anemia   . Arthritis    shoulders, knee, hand  . Asthma   . Endometriosis   . GERD (gastroesophageal reflux  disease)   . Ovarian cyst   . Vitamin D deficiency      Family History  Problem Relation Age of Onset  . Anemia Mother   . GER disease Mother   . Arthritis Maternal Grandmother   . Hypertension Maternal Grandmother   . Breast cancer Maternal Grandmother 19       over 62  . Hypertension Maternal Grandfather   . Clotting disorder Maternal Grandfather   . Breast cancer Paternal Grandmother   . Hypertension Paternal Grandfather   . Asthma Maternal Aunt   . Other Neg Hx      Past Surgical History:  Procedure Laterality Date  . HERNIA REPAIR    . LAPAROSCOPY N/A 08/22/2016   Procedure: LAPAROSCOPY OPERATIVE,excision of posterior culdesac endometriosis;  Surgeon: Eldred Manges, MD;  Location: [facility];  Service: Gynecology;  Laterality: N/A;  . WISDOM TOOTH EXTRACTION      Social History   Socioeconomic History  . Marital status: Single    Spouse name: Not on file  . Number of children: Not on file  . Years of education: Not on file  . Highest education level: Not on file  Occupational History  . Not on file  Tobacco Use  . Smoking status: Never Smoker  . Smokeless tobacco: Never Used  Substance and Sexual Activity  . Alcohol  use: No  . Drug use: No  . Sexual activity: Never    Birth control/protection: Injection  Other Topics Concern  . Not on file  Social History Narrative  . Not on file   Social Determinants of Health   Financial Resource Strain:   . Difficulty of Paying Living Expenses:   Food Insecurity:   . Worried About Charity fundraiser in the Last Year:   . Arboriculturist in the Last Year:   Transportation Needs:   . Film/video editor (Medical):   Marland Kitchen Lack of Transportation (Non-Medical):   Physical Activity:   . Days of Exercise per Week:   . Minutes of Exercise per Session:   Stress:   . Feeling of Stress :   Social Connections:   . Frequency of Communication with Friends and Family:   . Frequency of Social Gatherings with Friends and  Family:   . Attends Religious Services:   . Active Member of Clubs or Organizations:   . Attends Archivist Meetings:   Marland Kitchen Marital Status:   Intimate Partner Violence:   . Fear of Current or Ex-Partner:   . Emotionally Abused:   Marland Kitchen Physically Abused:   . Sexually Abused:      No Known Allergies   Immunization History  Administered Date(s) Administered  . PFIZER SARS-COV-2 Vaccination 01/02/2020, 01/27/2020  . PPD Test 05/13/2012  . Tdap 02/17/2019    Outpatient Medications Prior to Visit  Medication Sig Dispense Refill  . albuterol (PROVENTIL) (2.5 MG/3ML) 0.083% nebulizer solution Take 3 mLs (2.5 mg total) by nebulization every 6 (six) hours as needed for wheezing or shortness of breath. 150 mL 1  . albuterol (VENTOLIN HFA) 108 (90 Base) MCG/ACT inhaler Inhale 2 puffs into the lungs every 6 (six) hours as needed for wheezing or shortness of breath. 18 g 3  . benzonatate (TESSALON) 200 MG capsule Take 1 capsule (200 mg total) by mouth 2 (two) times daily as needed for cough. 20 capsule 0  . budesonide-formoterol (SYMBICORT) 80-4.5 MCG/ACT inhaler Inhale 2 puffs into the lungs 2 (two) times daily. 1 Inhaler 3  . cetirizine (ZYRTEC) 10 MG tablet Take 1 tablet (10 mg total) by mouth daily. 30 tablet 11  . clonazePAM (KLONOPIN) 0.5 MG tablet Take 1 tablet (0.5 mg total) by mouth 2 (two) times daily as needed for anxiety. 30 tablet 0  . fluticasone (FLONASE) 50 MCG/ACT nasal spray Place 2 sprays into both nostrils daily. 16 g 6  . levonorgestrel (MIRENA) 20 MCG/24HR IUD 1 each by Intrauterine route once.    . meclizine (ANTIVERT) 25 MG tablet Take 1 tablet (25 mg total) by mouth 3 (three) times daily as needed for dizziness. 30 tablet 0  . beclomethasone (QVAR REDIHALER) 80 MCG/ACT inhaler Inhale 2 puffs 2 (two) times daily into the lungs. (Patient not taking: Reported on 02/08/2020) 1 Inhaler 2  . HYDROcodone-homatropine (HYCODAN) 5-1.5 MG/5ML syrup Take 5 mLs by mouth every 8  (eight) hours as needed for cough. (Patient not taking: Reported on 12/23/2019) 60 mL 0   No facility-administered medications prior to visit.    Review of Systems  Constitutional: Negative for chills, fever and weight loss.  HENT: Positive for congestion.   Respiratory: Positive for cough, shortness of breath and wheezing.   Cardiovascular: Negative for chest pain and leg swelling.  Gastrointestinal: Positive for heartburn. Negative for blood in stool and melena.  Genitourinary: Negative.   Musculoskeletal: Negative.   Skin: Negative  for rash.  Neurological: Negative.   Endo/Heme/Allergies: Positive for environmental allergies.     Objective:   Vitals:   02/08/20 1043  BP: 120/62  Pulse: 97  Temp: 98.6 F (37 C)  TempSrc: Temporal  SpO2: 99%  Weight: 170 lb 9.6 oz (77.4 kg)  Height: 5\' 8"  (1.727 m)   99% on  RA BMI Readings from Last 3 Encounters:  02/08/20 25.94 kg/m  01/13/20 26.87 kg/m  12/02/19 25.24 kg/m   Wt Readings from Last 3 Encounters:  02/08/20 170 lb 9.6 oz (77.4 kg)  01/13/20 176 lb 12 oz (80.2 kg)  12/02/19 166 lb (75.3 kg)    Physical Exam Vitals reviewed.  Constitutional:      General: She is not in acute distress.    Appearance: She is not ill-appearing.  HENT:     Head: Normocephalic and atraumatic.  Eyes:     General: No scleral icterus. Cardiovascular:     Rate and Rhythm: Normal rate and regular rhythm.  Pulmonary:     Comments: Breathing comfortably on room air, no conversational dyspnea.  Suppressing cough with deep inspiration. CTAB. Musculoskeletal:        General: No swelling or deformity.     Cervical back: Neck supple.  Lymphadenopathy:     Cervical: No cervical adenopathy.  Skin:    General: Skin is warm and dry.     Findings: No rash.  Neurological:     General: No focal deficit present.     Mental Status: She is alert.     Coordination: Coordination normal.  Psychiatric:        Mood and Affect: Mood normal.         Behavior: Behavior normal.      CBC    Component Value Date/Time   WBC 4.8 02/08/2020 1145   RBC 4.67 02/08/2020 1145   HGB 13.3 02/08/2020 1145   HGB 12.5 01/13/2020 1658   HCT 41.1 02/08/2020 1145   HCT 38.7 01/13/2020 1658   PLT 305.0 02/08/2020 1145   PLT 309 01/13/2020 1658   MCV 87.9 02/08/2020 1145   MCV 89 01/13/2020 1658   MCH 28.7 01/13/2020 1658   MCH 27.8 07/04/2018 1549   MCH 28.4 08/14/2016 1130   MCHC 32.3 02/08/2020 1145   RDW 12.6 02/08/2020 1145   RDW 11.6 (L) 01/13/2020 1658   LYMPHSABS 1.8 02/08/2020 1145   LYMPHSABS 2.5 01/13/2020 1658   MONOABS 0.5 02/08/2020 1145   EOSABS 0.0 02/08/2020 1145   EOSABS 0.1 01/13/2020 1658   BASOSABS 0.0 02/08/2020 1145   BASOSABS 0.1 01/13/2020 1658    CHEMISTRY No results for input(s): NA, K, CL, CO2, GLUCOSE, BUN, CREATININE, CALCIUM, MG, PHOS in the last 168 hours. CrCl cannot be calculated (Patient's most recent lab result is older than the maximum 21 days allowed.).   Chest Imaging- films reviewed: CXR, 2 view 01/13/2020-normal  Pulmonary Functions Testing Results: No flowsheet data found.  Spirometry 09/09/2017: FVC 3.19 (89%) FEV1 2.05 (67%) Ratio 64      Assessment & Plan:     ICD-10-CM   1. History of COVID-19  Z86.16   2. Chronic cough  R05   3. Gastroesophageal reflux disease, unspecified whether esophagitis present  K21.9 Pulmonary function test  4. Moderate persistent asthma without complication  123456 IgE    CBC w/Diff    Moderate persistent asthma- uncontrolled. -Continue Symbicort twice daily.  We reviewed the importance of compliance and not missing doses.  Recommend setting an  alarm on her phone or keeping it near her toothbrush.  She should rinse her mouth after every use. -Start Singulair once daily. -Continue albuterol every 4 hours as needed -Up-to-date on Covid vaccines. -Recommend annual flu vaccines and pneumonia vaccines -Checking IgE and CBC with differential to  determine if she has allergic asthma which may benefit from Biologics if she is unable to be controlled with inhaled medications -PFTs  History of COVID-19 viral pneumonia -Optimize asthma management.  If persistent symptoms and restriction on PFTs will obtain HRCT chest  GERD -Omeprazole once daily  Allergic rhinosinusitis -Continue Zyrtec and Flonase -Start Singulair once daily  RTC in 1-2 months after PFTs.    Current Outpatient Medications:  .  albuterol (PROVENTIL) (2.5 MG/3ML) 0.083% nebulizer solution, Take 3 mLs (2.5 mg total) by nebulization every 6 (six) hours as needed for wheezing or shortness of breath., Disp: 150 mL, Rfl: 1 .  albuterol (VENTOLIN HFA) 108 (90 Base) MCG/ACT inhaler, Inhale 2 puffs into the lungs every 6 (six) hours as needed for wheezing or shortness of breath., Disp: 18 g, Rfl: 3 .  benzonatate (TESSALON) 200 MG capsule, Take 1 capsule (200 mg total) by mouth 2 (two) times daily as needed for cough., Disp: 20 capsule, Rfl: 0 .  budesonide-formoterol (SYMBICORT) 80-4.5 MCG/ACT inhaler, Inhale 2 puffs into the lungs 2 (two) times daily., Disp: 1 Inhaler, Rfl: 3 .  cetirizine (ZYRTEC) 10 MG tablet, Take 1 tablet (10 mg total) by mouth daily., Disp: 30 tablet, Rfl: 11 .  clonazePAM (KLONOPIN) 0.5 MG tablet, Take 1 tablet (0.5 mg total) by mouth 2 (two) times daily as needed for anxiety., Disp: 30 tablet, Rfl: 0 .  fluticasone (FLONASE) 50 MCG/ACT nasal spray, Place 2 sprays into both nostrils daily., Disp: 16 g, Rfl: 6 .  levonorgestrel (MIRENA) 20 MCG/24HR IUD, 1 each by Intrauterine route once., Disp: , Rfl:  .  meclizine (ANTIVERT) 25 MG tablet, Take 1 tablet (25 mg total) by mouth 3 (three) times daily as needed for dizziness., Disp: 30 tablet, Rfl: 0 .  beclomethasone (QVAR REDIHALER) 80 MCG/ACT inhaler, Inhale 2 puffs 2 (two) times daily into the lungs. (Patient not taking: Reported on 02/08/2020), Disp: 1 Inhaler, Rfl: 2 .  HYDROcodone-homatropine  (HYCODAN) 5-1.5 MG/5ML syrup, Take 5 mLs by mouth every 8 (eight) hours as needed for cough. (Patient not taking: Reported on 12/23/2019), Disp: 60 mL, Rfl: 0 .  montelukast (SINGULAIR) 10 MG tablet, Take 1 tablet (10 mg total) by mouth at bedtime., Disp: 30 tablet, Rfl: 11 .  omeprazole (PRILOSEC) 20 MG capsule, Take 1 capsule (20 mg total) by mouth daily., Disp: 30 capsule, Rfl: Aneta Hawa Henly, DO [facility] 02/08/2020 7:24 PM

## 2020-02-09 LAB — IGE: IgE (Immunoglobulin E), Serum: 67 kU/L (ref <=114)

## 2020-02-12 ENCOUNTER — Other Ambulatory Visit: Payer: Self-pay

## 2020-02-12 ENCOUNTER — Ambulatory Visit: Payer: BC Managed Care – PPO | Admitting: Nurse Practitioner

## 2020-02-12 VITALS — BP 102/68 | Temp 97.8°F | Ht 68.0 in | Wt 175.2 lb

## 2020-02-12 DIAGNOSIS — R05 Cough: Secondary | ICD-10-CM

## 2020-02-12 DIAGNOSIS — J4540 Moderate persistent asthma, uncomplicated: Secondary | ICD-10-CM | POA: Diagnosis not present

## 2020-02-12 DIAGNOSIS — Z8616 Personal history of COVID-19: Secondary | ICD-10-CM

## 2020-02-12 DIAGNOSIS — R059 Cough, unspecified: Secondary | ICD-10-CM

## 2020-02-12 NOTE — Progress Notes (Signed)
@Patient  ID: Stacey Key, female    DOB: 1986-09-19, 34 y.o.   MRN: [id number]  Chief Complaint  Patient presents with  . Follow-up    Still having cough but now she is coughing up phlegm, is able to smell certain things.     Referring provider: Forrest Moron, MD   34 year old female with history of asthma, seasonal allergic rhinitis, GERD, anemia. Diagnosed with Covid February 2021.   HPI  Patient presents for post Covid care clinic follow-up visit.  She was diagnosed with Covid in February.  Since that time she has had a chronic cough.  She did lose taste and smell.  She states that her smell is slowly returning.  Since her last visit here she has seen pulmonary.  She was continued on the Symbicort that we prescribed at last visit and albuterol as needed.  She was started on Singulair and omeprazole.  She states that her cough is slowly improving.  She is scheduled to return to pulmonary for PFT and lab work to rule out allergic asthma.  Patient is trying to stay active.  She would like to start back with her dance team at church and singing in church.  We discussed that she could trial participating in practices and advance activity as tolerated.  She may need to take frequent rest breaks during practice.  Denies f/c/s, n/v/d, hemoptysis, PND, leg swelling, chest pain or edema    No Known Allergies  Immunization History  Administered Date(s) Administered  . PFIZER SARS-COV-2 Vaccination 01/02/2020, 01/27/2020  . PPD Test 05/13/2012  . Tdap 02/17/2019    Past Medical History:  Diagnosis Date  . Allergy   . Anemia   . Arthritis    shoulders, knee, hand  . Asthma   . Endometriosis   . GERD (gastroesophageal reflux disease)   . Ovarian cyst   . Vitamin D deficiency     Tobacco History: Social History   Tobacco Use  Smoking Status Never Smoker  Smokeless Tobacco Never Used   Counseling given: Not Answered   Outpatient Encounter Medications as of 02/12/2020   Medication Sig  . albuterol (PROVENTIL) (2.5 MG/3ML) 0.083% nebulizer solution Take 3 mLs (2.5 mg total) by nebulization every 6 (six) hours as needed for wheezing or shortness of breath.  Marland Kitchen albuterol (VENTOLIN HFA) 108 (90 Base) MCG/ACT inhaler Inhale 2 puffs into the lungs every 6 (six) hours as needed for wheezing or shortness of breath.  . benzonatate (TESSALON) 200 MG capsule Take 1 capsule (200 mg total) by mouth 2 (two) times daily as needed for cough.  . budesonide-formoterol (SYMBICORT) 80-4.5 MCG/ACT inhaler Inhale 2 puffs into the lungs 2 (two) times daily.  . cetirizine (ZYRTEC) 10 MG tablet Take 1 tablet (10 mg total) by mouth daily.  . clonazePAM (KLONOPIN) 0.5 MG tablet Take 1 tablet (0.5 mg total) by mouth 2 (two) times daily as needed for anxiety.  . fluticasone (FLONASE) 50 MCG/ACT nasal spray Place 2 sprays into both nostrils daily.  Marland Kitchen levonorgestrel (MIRENA) 20 MCG/24HR IUD 1 each by Intrauterine route once.  . meclizine (ANTIVERT) 25 MG tablet Take 1 tablet (25 mg total) by mouth 3 (three) times daily as needed for dizziness.  . montelukast (SINGULAIR) 10 MG tablet Take 1 tablet (10 mg total) by mouth at bedtime.  Marland Kitchen omeprazole (PRILOSEC) 20 MG capsule Take 1 capsule (20 mg total) by mouth daily.  . beclomethasone (QVAR REDIHALER) 80 MCG/ACT inhaler Inhale 2 puffs 2 (  two) times daily into the lungs. (Patient not taking: Reported on 02/08/2020)  . HYDROcodone-homatropine (HYCODAN) 5-1.5 MG/5ML syrup Take 5 mLs by mouth every 8 (eight) hours as needed for cough. (Patient not taking: Reported on 12/23/2019)   No facility-administered encounter medications on file as of 02/12/2020.     Review of Systems  Review of Systems  Constitutional: Negative.  Negative for fever.  HENT: Negative.   Respiratory: Positive for cough and shortness of breath.   Cardiovascular: Negative.  Negative for chest pain, palpitations and leg swelling.  Gastrointestinal: Negative.    Allergic/Immunologic: Negative.   Neurological: Negative.   Psychiatric/Behavioral: Negative.        Physical Exam  BP 102/68 (BP Location: Left Arm, Patient Position: Sitting, Cuff Size: Small)   Temp 97.8 F (36.6 C)   Ht 5\' 8"  (1.727 m)   Wt 175 lb 4 oz (79.5 kg)   BMI 26.65 kg/m   Wt Readings from Last 5 Encounters:  02/12/20 175 lb 4 oz (79.5 kg)  02/08/20 170 lb 9.6 oz (77.4 kg)  01/13/20 176 lb 12 oz (80.2 kg)  12/02/19 166 lb (75.3 kg)  11/11/19 165 lb (74.8 kg)     Physical Exam Vitals and nursing note reviewed.  Constitutional:      General: She is not in acute distress.    Appearance: She is well-developed.  Cardiovascular:     Rate and Rhythm: Normal rate and regular rhythm.  Pulmonary:     Effort: Pulmonary effort is normal.     Breath sounds: Normal breath sounds.  Neurological:     Mental Status: She is alert and oriented to person, place, and time.      Lab Results:  CBC    Component Value Date/Time   WBC 4.8 02/08/2020 1145   RBC 4.67 02/08/2020 1145   HGB 13.3 02/08/2020 1145   HGB 12.5 01/13/2020 1658   HCT 41.1 02/08/2020 1145   HCT 38.7 01/13/2020 1658   PLT 305.0 02/08/2020 1145   PLT 309 01/13/2020 1658   MCV 87.9 02/08/2020 1145   MCV 89 01/13/2020 1658   MCH 28.7 01/13/2020 1658   MCH 27.8 07/04/2018 1549   MCH 28.4 08/14/2016 1130   MCHC 32.3 02/08/2020 1145   RDW 12.6 02/08/2020 1145   RDW 11.6 (L) 01/13/2020 1658   LYMPHSABS 1.8 02/08/2020 1145   LYMPHSABS 2.5 01/13/2020 1658   MONOABS 0.5 02/08/2020 1145   EOSABS 0.0 02/08/2020 1145   EOSABS 0.1 01/13/2020 1658   BASOSABS 0.0 02/08/2020 1145   BASOSABS 0.1 01/13/2020 1658    BMET    Component Value Date/Time   NA 140 01/13/2020 1658   K 4.7 01/13/2020 1658   CL 102 01/13/2020 1658   CO2 26 01/13/2020 1658   GLUCOSE 85 01/13/2020 1658   GLUCOSE 93 03/16/2014 2116   BUN 11 01/13/2020 1658   CREATININE 0.83 01/13/2020 1658   CALCIUM 9.6 01/13/2020 1658    GFRNONAA 94 01/13/2020 1658   GFRAA 109 01/13/2020 1658     Imaging: DG Chest 2 View  Result Date: 01/13/2020 CLINICAL DATA:  History of COVID-19 positivity with persistent cough and loss of smell EXAM: CHEST - 2 VIEW COMPARISON:  09/26/2017 FINDINGS: Cardiac shadow is within normal limits. The lungs are well aerated bilaterally. No focal infiltrate or sizable effusion is seen. No acute bony abnormality is noted. IMPRESSION: No acute abnormality seen. Electronically Signed   By: Inez Catalina M.D.   On: 01/13/2020 23:46  Assessment & Plan:   History of COVID-19 Moderate persistent asthma Cough:  Continue Symbicort  Continue Singulair  Continue zyrtec  Continue albuterol  Continue omeprazole  Please keep follow up with pulmonary for PFT and labs  Stay active - may increase activity as tolerated  Follow up:  Follow up in 3 months or sooner if needed      Fenton Foy, NP 02/12/2020

## 2020-02-12 NOTE — Patient Instructions (Addendum)
History of Covid Moderate persistent asthma Cough:  Continue Symbicort  Continue Singulair  Continue zyrtec  Continue albuterol  Continue omeprazole  Please keep follow up with pulmonary for PFT and labs  Stay active - may increase activity as tolerated  Follow up:  Follow up in 3 months or sooner if needed

## 2020-02-12 NOTE — Assessment & Plan Note (Signed)
Moderate persistent asthma Cough:  Continue Symbicort  Continue Singulair  Continue zyrtec  Continue albuterol  Continue omeprazole  Please keep follow up with pulmonary for PFT and labs  Stay active - may increase activity as tolerated  Follow up:  Follow up in 3 months or sooner if needed

## 2020-03-26 ENCOUNTER — Other Ambulatory Visit (HOSPITAL_COMMUNITY)
Admission: RE | Admit: 2020-03-26 | Discharge: 2020-03-26 | Disposition: A | Discharge status: Home / Self Care | Payer: BC Managed Care – PPO | Source: Ambulatory Visit | Attending: Critical Care Medicine | Admitting: Critical Care Medicine

## 2020-03-26 DIAGNOSIS — Z20822 Contact with and (suspected) exposure to covid-19: Secondary | ICD-10-CM | POA: Diagnosis not present

## 2020-03-26 DIAGNOSIS — Z01812 Encounter for preprocedural laboratory examination: Secondary | ICD-10-CM | POA: Insufficient documentation

## 2020-03-26 LAB — SARS CORONAVIRUS 2 (TAT 6-24 HRS): SARS Coronavirus 2: NEGATIVE

## 2020-03-30 ENCOUNTER — Ambulatory Visit: Payer: BC Managed Care – PPO | Admitting: Critical Care Medicine

## 2020-03-30 ENCOUNTER — Ambulatory Visit (INDEPENDENT_AMBULATORY_CARE_PROVIDER_SITE_OTHER): Payer: BC Managed Care – PPO | Admitting: Critical Care Medicine

## 2020-03-30 ENCOUNTER — Ambulatory Visit: Payer: BC Managed Care – PPO | Admitting: Acute Care

## 2020-03-30 ENCOUNTER — Encounter: Payer: Self-pay | Admitting: Acute Care

## 2020-03-30 ENCOUNTER — Other Ambulatory Visit: Payer: Self-pay

## 2020-03-30 VITALS — BP 118/70 | HR 81 | Temp 97.3°F | Ht 68.0 in | Wt 176.4 lb

## 2020-03-30 DIAGNOSIS — U071 COVID-19: Secondary | ICD-10-CM

## 2020-03-30 DIAGNOSIS — J1282 Pneumonia due to coronavirus disease 2019: Secondary | ICD-10-CM

## 2020-03-30 DIAGNOSIS — J45909 Unspecified asthma, uncomplicated: Secondary | ICD-10-CM | POA: Diagnosis not present

## 2020-03-30 DIAGNOSIS — J309 Allergic rhinitis, unspecified: Secondary | ICD-10-CM | POA: Diagnosis not present

## 2020-03-30 DIAGNOSIS — K219 Gastro-esophageal reflux disease without esophagitis: Secondary | ICD-10-CM

## 2020-03-30 LAB — PULMONARY FUNCTION TEST
DL/VA % pred: 133 %
DL/VA: 5.92 ml/min/mmHg/L
DLCO cor % pred: 98 %
DLCO cor: 25.07 ml/min/mmHg
DLCO unc % pred: 97 %
DLCO unc: 24.99 ml/min/mmHg
FEF 25-75 Post: 3.61 L/sec
FEF 25-75 Pre: 3.04 L/sec
FEF2575-%Change-Post: 18 %
FEF2575-%Pred-Post: 104 %
FEF2575-%Pred-Pre: 87 %
FEV1-%Change-Post: 3 %
FEV1-%Pred-Post: 90 %
FEV1-%Pred-Pre: 87 %
FEV1-Post: 2.79 L
FEV1-Pre: 2.69 L
FEV1FVC-%Change-Post: 2 %
FEV1FVC-%Pred-Pre: 99 %
FEV6-%Change-Post: 0 %
FEV6-%Pred-Post: 87 %
FEV6-%Pred-Pre: 87 %
FEV6-Post: 3.19 L
FEV6-Pre: 3.16 L
FEV6FVC-%Change-Post: 0 %
FEV6FVC-%Pred-Post: 101 %
FEV6FVC-%Pred-Pre: 101 %
FVC-%Change-Post: 0 %
FVC-%Pred-Post: 86 %
FVC-%Pred-Pre: 86 %
FVC-Post: 3.19 L
FVC-Pre: 3.16 L
Post FEV1/FVC ratio: 87 %
Post FEV6/FVC ratio: 100 %
Pre FEV1/FVC ratio: 85 %
Pre FEV6/FVC Ratio: 100 %

## 2020-03-30 NOTE — Patient Instructions (Addendum)
It is good to see you today. Remember, controlling your asthma is thee key to cutting down flares.  Continue Symbicort 2 puffs daily without fail.  Rinse mouth after use -Continue albuterol every 4 hours as needed -Congratulations on getting your Covid vaccines. --PFTs do not show restriction  History of COVID-19 viral pneumonia -Optimize asthma management.   GERD -Omeprazole once daily  Allergic rhinosinusitis -Continue Zyrtec and Flonase - continue  Singulair once daily  Follow up with Dr. Elesa Hacker NP in 3 months. Call us if you need [place] sooner, we will get you seen.  Please contact office for sooner follow up if symptoms do not improve or worsen or seek emergency care

## 2020-03-30 NOTE — Progress Notes (Signed)
History of Present Illness Stacey Key is a 34 y.o. female never smoker with history of asthma ( diagnosed 4 years ago) , seasonal allergic rhinitis, GERD, anemia. Diagnosed with Covid February 2021.She is followed by Stacey Key.  Synopsis: Since diagnosis of Covid in 11/2019, patient has had a chronic cough. She was started on Symbicort  Singulair and omeprazole. ( recent escalation form Qvar to Symbicort for her asthma.) She states that her cough is slowly improving. She had missed multiple doses of Symbicort prior to her last visit with Stacey Key due to multiple family deaths.She has noted her breathing is worse on the days she misses her medication.  She has chronic GERD, but has not been on medication for this recently due to being unable to get a refill.She does drink carbonated drinks, but she does not drink much caffeine. She takes Zyrtec and Flonase for allergic rhinosinusitis, which has not been well controlled recently. She has never used Singulair in the past.   04/05/2020 Follow up Asthma Pt. Presents for follow up. She states she has been compliant with her Symbicort every day as prescribed. She has been using her Omeprazole daily as well as her Zyrtec and Flonase. She has been compliant with her Singulair . She states her coughing has decreased drastically. Addionally she has had improvement in her shortness of breath. She has only had to use her rescue inhaler when she is singing once a month.  This is much improved. She has been able to dance more without using her rescue inhaler. She states she has not had any wheezing. She states she has been coughing up secretions. These were greenish to start and now are a light yellow. She denies fever, chest pain, orthopnea or hemoptysis.   Test Results: PFT's 03/30/2020        IgE 02/08/2020>> 67   Spirometry 09/09/2017: FVC 3.19 (89%) FEV1 2.05 (67%) Ratio 64  CBC Latest Ref Rng & Units 02/08/2020 01/13/2020 07/04/2018  WBC 4.0 -  10.5 K/uL 4.8 5.9 5.2  Hemoglobin 12.0 - 15.0 g/dL 13.3 12.5 13.7  Hematocrit 36 - 46 % 41.1 38.7 42.2  Platelets 150 - 400 K/uL 305.0 309 -    BMP Latest Ref Rng & Units 01/13/2020 03/16/2014 11/30/2012  Glucose 65 - 99 mg/dL 85 93 84  BUN 6 - 20 mg/dL 11 8 10   Creatinine 0.57 - 1.00 mg/dL 0.83 0.80 0.72  BUN/Creat Ratio 9 - 23 13 - -  Sodium 134 - 144 mmol/L 140 140 137  Potassium 3.5 - 5.2 mmol/L 4.7 4.4 3.5  Chloride 96 - 106 mmol/L 102 102 102  CO2 20 - 29 mmol/L 26 23 24   Calcium 8.7 - 10.2 mg/dL 9.6 9.2 9.6    BNP No results found for: BNP  ProBNP No results found for: PROBNP  PFT    Component Value Date/Time   FEV1PRE 2.69 03/30/2020 1256   FEV1POST 2.79 03/30/2020 1256   FVCPRE 3.16 03/30/2020 1256   FVCPOST 3.19 03/30/2020 1256   DLCOUNC 24.99 03/30/2020 1256   PREFEV1FVCRT 85 03/30/2020 1256   PSTFEV1FVCRT 87 03/30/2020 1256    No results found.   Past medical hx Past Medical History:  Diagnosis Date  . Allergy   . Anemia   . Arthritis    shoulders, knee, hand  . Asthma   . Endometriosis   . GERD (gastroesophageal reflux disease)   . Ovarian cyst   . Vitamin D deficiency      Social  History   Tobacco Use  . Smoking status: Never Smoker  . Smokeless tobacco: Never Used  Substance Use Topics  . Alcohol use: No  . Drug use: No    Stacey.Key reports that she has never smoked. She has never used smokeless tobacco. She reports that she does not drink alcohol and does not use drugs.  Tobacco Cessation: Never Smoker  Past surgical hx, Family hx, Social hx all reviewed.  Current Outpatient Medications on File Prior to Visit  Medication Sig  . albuterol (PROVENTIL) (2.5 MG/3ML) 0.083% nebulizer solution Take 3 mLs (2.5 mg total) by nebulization every 6 (six) hours as needed for wheezing or shortness of breath.  Marland Kitchen albuterol (VENTOLIN HFA) 108 (90 Base) MCG/ACT inhaler Inhale 2 puffs into the lungs every 6 (six) hours as needed for wheezing or  shortness of breath.  . benzonatate (TESSALON) 200 MG capsule Take 1 capsule (200 mg total) by mouth 2 (two) times daily as needed for cough.  . budesonide-formoterol (SYMBICORT) 80-4.5 MCG/ACT inhaler Inhale 2 puffs into the lungs 2 (two) times daily.  . cetirizine (ZYRTEC) 10 MG tablet Take 1 tablet (10 mg total) by mouth daily.  . clonazePAM (KLONOPIN) 0.5 MG tablet Take 1 tablet (0.5 mg total) by mouth 2 (two) times daily as needed for anxiety.  . fluticasone (FLONASE) 50 MCG/ACT nasal spray Place 2 sprays into both nostrils daily.  Marland Kitchen levonorgestrel (MIRENA) 20 MCG/24HR IUD 1 each by Intrauterine route once.  . meclizine (ANTIVERT) 25 MG tablet Take 1 tablet (25 mg total) by mouth 3 (three) times daily as needed for dizziness.  . montelukast (SINGULAIR) 10 MG tablet Take 1 tablet (10 mg total) by mouth at bedtime.  Marland Kitchen omeprazole (PRILOSEC) 20 MG capsule Take 1 capsule (20 mg total) by mouth daily.  Marland Kitchen HYDROcodone-homatropine (HYCODAN) 5-1.5 MG/5ML syrup Take 5 mLs by mouth every 8 (eight) hours as needed for cough. (Patient not taking: Reported on 12/23/2019)   No current facility-administered medications on file prior to visit.     No Known Allergies  Review Of Systems:  Constitutional:   No  weight loss, night sweats,  Fevers, chills, fatigue, or  lassitude.  HEENT:   No headaches,  Difficulty swallowing,  Tooth/dental problems, or  Sore throat,                No sneezing, itching, ear ache, nasal congestion, post nasal drip,   CV:  No chest pain,  Orthopnea, PND, swelling in lower extremities, anasarca, dizziness, palpitations, syncope.   GI  No heartburn, indigestion, abdominal pain, nausea, vomiting, diarrhea, change in bowel habits, loss of appetite, bloody stools.   Resp: Rare  shortness of breath with exertion none  at rest.  No excess mucus, no productive cough,  No non-productive cough,  No coughing up of blood.  No change in color of mucus.  No wheezing.  No chest wall  deformity  Skin: no rash or lesions.  GU: no dysuria, change in color of urine, no urgency or frequency.  No flank pain, no hematuria   Stacey:  No joint pain or swelling.  No decreased range of motion.  No back pain.  Psych:  No change in mood or affect. No depression or anxiety.  No memory loss.   Vital Signs BP 118/70 (BP Location: Left Arm, Cuff Size: Normal)   Pulse 81   Temp (!) 97.3 F (36.3 C)   Ht 5\' 8"  (1.727 m)   Wt 176 lb 6.4 oz (  80 kg)   SpO2 100%   BMI 26.82 kg/m    Physical Exam:  General- No distress,  A&Ox3, pleasant ENT: No sinus tenderness, TM clear, pale nasal mucosa, no oral exudate,no post nasal drip, no LAN Cardiac: S1, S2, regular rate and rhythm, no murmur Chest: No wheeze/ rales/ dullness; no accessory muscle use, no nasal flaring, no sternal retractions Abd.: Soft Non-tender, ND, BS +, Body mass index is 26.82 kg/m. Ext: No clubbing cyanosis, edema Neuro:  normal strength, MAE x 4, A&O x 3 Skin: No rashes, No lesions, warm and dry Psych: normal mood and behavior   Assessment/Plan Resolved Asthma Flare Plan Remember, controlling your asthma is thee key to cutting down flares.  Continue Symbicort 2 puffs daily without fail.  Rinse mouth after use -Continue albuterol every 4 hours as needed -Congratulations on getting your Covid vaccines. --PFTs do not show restriction  History of COVID-19 viral pneumonia -Optimize asthma management.   GERD -Omeprazole once daily  Allergic rhinosinusitis -Continue Zyrtec and Flonase - continue  Singulair once daily  Follow up with Dr. Elesa Hacker NP in 3 months. Call us if you need [place] sooner, we will get you seen.  Please contact office for sooner follow up if symptoms do not improve or worsen or seek emergency care    Magdalen Spatz, NP 04/05/2020  10:59 AM

## 2020-04-05 ENCOUNTER — Encounter: Payer: Self-pay | Admitting: Acute Care

## 2020-04-26 ENCOUNTER — Telehealth: Payer: Self-pay | Admitting: Family Medicine

## 2020-04-26 NOTE — Telephone Encounter (Signed)
Pt was called to resched 8/13 appt

## 2020-05-13 ENCOUNTER — Ambulatory Visit: Payer: BC Managed Care – PPO

## 2020-06-20 ENCOUNTER — Ambulatory Visit (INDEPENDENT_AMBULATORY_CARE_PROVIDER_SITE_OTHER): Payer: BC Managed Care – PPO | Admitting: Acute Care

## 2020-06-20 ENCOUNTER — Other Ambulatory Visit: Payer: Self-pay

## 2020-06-20 ENCOUNTER — Encounter: Payer: Self-pay | Admitting: Acute Care

## 2020-06-20 VITALS — BP 110/70 | HR 94 | Temp 97.1°F | Ht 68.0 in | Wt 177.4 lb

## 2020-06-20 DIAGNOSIS — U071 COVID-19: Secondary | ICD-10-CM

## 2020-06-20 DIAGNOSIS — J45909 Unspecified asthma, uncomplicated: Secondary | ICD-10-CM | POA: Diagnosis not present

## 2020-06-20 NOTE — Progress Notes (Signed)
History of Present Illness Stacey Key is a 34 y.o. female with asthma ( diagnosed 4 years ago) , seasonal allergic rhinitis, GERD, anemia. Diagnosed with Covid February 2021.She is followed by Dr. Carlis Abbott.   06/20/2020 Pt.presents for follow up. She states she has been doing well. No recent flares. She states she has had a stable interval for her . She has been able to sing and dance without problems. She states she is compliant with her Symbicort , Singulair, and Zyrtec.She does have a current ear infection which her PCP has ordered triple antibiotic drops . Her post Covid cough has improved.She no longer needs her cough medication.   She has not had to use rescue inhaler or nebs since the last OV here. She is able to sing and dance without problems. She is coughing up some secretions that are yellow to tan in color. She states this is normal for her. She denies fever, chest pain, orthopnea or hemoptysis.    Test Results:         CBC Latest Ref Rng & Units 02/08/2020 01/13/2020 07/04/2018  WBC 4.0 - 10.5 K/uL 4.8 5.9 5.2  Hemoglobin 12.0 - 15.0 g/dL 13.3 12.5 13.7  Hematocrit 36 - 46 % 41.1 38.7 42.2  Platelets 150 - 400 K/uL 305.0 309 -    BMP Latest Ref Rng & Units 01/13/2020 03/16/2014 11/30/2012  Glucose 65 - 99 mg/dL 85 93 84  BUN 6 - 20 mg/dL 11 8 10   Creatinine 0.57 - 1.00 mg/dL 0.83 0.80 0.72  BUN/Creat Ratio 9 - 23 13 - -  Sodium 134 - 144 mmol/L 140 140 137  Potassium 3.5 - 5.2 mmol/L 4.7 4.4 3.5  Chloride 96 - 106 mmol/L 102 102 102  CO2 20 - 29 mmol/L 26 23 24   Calcium 8.7 - 10.2 mg/dL 9.6 9.2 9.6    BNP No results found for: BNP  ProBNP No results found for: PROBNP  PFT    Component Value Date/Time   FEV1PRE 2.69 03/30/2020 1256   FEV1POST 2.79 03/30/2020 1256   FVCPRE 3.16 03/30/2020 1256   FVCPOST 3.19 03/30/2020 1256   DLCOUNC 24.99 03/30/2020 1256   PREFEV1FVCRT 85 03/30/2020 1256   PSTFEV1FVCRT 87 03/30/2020 1256    No results found.   Past  medical hx Past Medical History:  Diagnosis Date  . Allergy   . Anemia   . Arthritis    shoulders, knee, hand  . Asthma   . Endometriosis   . GERD (gastroesophageal reflux disease)   . Ovarian cyst   . Vitamin D deficiency      Social History   Tobacco Use  . Smoking status: Never Smoker  . Smokeless tobacco: Never Used  Substance Use Topics  . Alcohol use: No  . Drug use: No    Ms.Starke reports that she has never smoked. She has never used smokeless tobacco. She reports that she does not drink alcohol and does not use drugs.  Tobacco Cessation: Never smoker  Past surgical hx, Family hx, Social hx all reviewed.  Current Outpatient Medications on File Prior to Visit  Medication Sig  . albuterol (PROVENTIL) (2.5 MG/3ML) 0.083% nebulizer solution Take 3 mLs (2.5 mg total) by nebulization every 6 (six) hours as needed for wheezing or shortness of breath.  Marland Kitchen albuterol (VENTOLIN HFA) 108 (90 Base) MCG/ACT inhaler Inhale 2 puffs into the lungs every 6 (six) hours as needed for wheezing or shortness of breath.  . budesonide-formoterol (SYMBICORT) 80-4.5 MCG/ACT  inhaler Inhale 2 puffs into the lungs 2 (two) times daily.  . cetirizine (ZYRTEC) 10 MG tablet Take 1 tablet (10 mg total) by mouth daily.  . clonazePAM (KLONOPIN) 0.5 MG tablet Take 1 tablet (0.5 mg total) by mouth 2 (two) times daily as needed for anxiety.  . fluticasone (FLONASE) 50 MCG/ACT nasal spray Place 2 sprays into both nostrils daily.  Marland Kitchen levonorgestrel (MIRENA) 20 MCG/24HR IUD 1 each by Intrauterine route once.  . montelukast (SINGULAIR) 10 MG tablet Take 1 tablet (10 mg total) by mouth at bedtime.  Marland Kitchen omeprazole (PRILOSEC) 20 MG capsule Take 1 capsule (20 mg total) by mouth daily.   No current facility-administered medications on file prior to visit.     No Known Allergies  Review Of Systems:  Constitutional:   No  weight loss, night sweats,  Fevers, chills, fatigue, or  lassitude.  HEENT:   No  headaches,  Difficulty swallowing,  Tooth/dental problems, or  Sore throat,                No sneezing, itching, ear ache, nasal congestion, post nasal drip,   CV:  No chest pain,  Orthopnea, PND, swelling in lower extremities, anasarca, dizziness, palpitations, syncope.   GI  No heartburn, indigestion, abdominal pain, nausea, vomiting, diarrhea, change in bowel habits, loss of appetite, bloody stools.   Resp: No shortness of breath with exertion or at rest.  No excess mucus, no productive cough,  No non-productive cough,  No coughing up of blood.  No change in color of mucus.  No wheezing.  No chest wall deformity  Skin: no rash or lesions.  GU: no dysuria, change in color of urine, no urgency or frequency.  No flank pain, no hematuria   MS:  No joint pain or swelling.  No decreased range of motion.  No back pain.  Psych:  No change in mood or affect. No depression or anxiety.  No memory loss.   Vital Signs BP 110/70 (BP Location: Left Arm, Cuff Size: Normal)   Pulse 94   Temp (!) 97.1 F (36.2 C) (Oral)   Ht 5\' 8"  (1.727 m)   Wt 177 lb 6.4 oz (80.5 kg)   SpO2 93%   BMI 26.97 kg/m    Physical Exam:  General- No distress,  A&Ox3, pleasant ENT: No sinus tenderness, TM clear, pale nasal mucosa, no oral exudate,no post nasal drip, no LAN Cardiac: S1, S2, regular rate and rhythm, no murmur Chest: No wheeze/ rales/ dullness; no accessory muscle use, no nasal flaring, no sternal retractions Abd.: Soft Non-tender, ND, BS +, Body mass index is 26.97 kg/m. Ext: No clubbing cyanosis, edema Neuro:  normal strength, MAE x 4, A&O x 3 Skin: No rashes, no lesions, warm and dry Psych: normal mood and behavior   Assessment/Plan  Asthma Stable interval Plan We will continue Symbicort 2 puffs twice daily as you have been doing. Rinse mouth after use. Continue Singulair and Zyrtec as you have been doing. Continue Flonase daily as you have been doing Follow up with PCP as needed  regarding ear infection.  We will give you a letter stating you have asthma and have had Covid 19 11/2019 and have a very difficult time wearing a mask for long periods of time for your [employer]. Thanks so much for vaccinating yourself against Covid 19.  Follow up in 3 months with Judson Roch NP.  Call if you need [place] sooner.  Please contact office for sooner follow up  if symptoms do not improve or worsen or seek emergency care    Magdalen Spatz, NP 06/20/2020  12:32 PM

## 2020-06-20 NOTE — Patient Instructions (Signed)
It is good to see you today. I am so glad you are doing so well.  We will continue Symbicort 2 puffs twice daily as you have been doing. Rinse mouth after use. Continue Singulair and Zyrtec as you have been doing. Continue Flonase daily as you have been doing Follow up with PCP as needed regarding ear infection.  We will give you a letter stating you have asthma and have had Covid 19 11/2019 and have a very difficult time wearing a mask for long periods of time. Follow up in 3 months with Judson Roch NP.  Please contact office for sooner follow up if symptoms do not improve or worsen or seek emergency care

## 2020-07-29 ENCOUNTER — Ambulatory Visit: Payer: BC Managed Care – PPO | Attending: Internal Medicine

## 2020-07-29 DIAGNOSIS — Z23 Encounter for immunization: Secondary | ICD-10-CM

## 2020-07-29 NOTE — Progress Notes (Signed)
   Covid-19 Vaccination Clinic  Name:  Stacey Key    MRN: [id number] DOB: 1986/06/27  07/29/2020  [name] was observed post Covid-19 immunization for 15 minutes without incident. She was provided with Vaccine Information Sheet and instruction to access the V-Safe system.   [name] was instructed to call 911 with any severe reactions post vaccine: Marland Kitchen Difficulty breathing  . Swelling of face and throat  . A fast heartbeat  . A bad rash all over body  . Dizziness and weakness

## 2020-09-22 ENCOUNTER — Other Ambulatory Visit: Payer: Self-pay | Admitting: Critical Care Medicine

## 2021-02-10 ENCOUNTER — Other Ambulatory Visit: Payer: Self-pay | Admitting: Nurse Practitioner

## 2021-02-25 ENCOUNTER — Other Ambulatory Visit: Payer: Self-pay | Admitting: Critical Care Medicine

## 2021-06-27 ENCOUNTER — Other Ambulatory Visit: Payer: Self-pay

## 2021-06-27 ENCOUNTER — Other Ambulatory Visit: Payer: Self-pay | Admitting: Nurse Practitioner

## 2021-06-27 ENCOUNTER — Encounter: Payer: Self-pay | Admitting: Nurse Practitioner

## 2021-06-27 ENCOUNTER — Ambulatory Visit (INDEPENDENT_AMBULATORY_CARE_PROVIDER_SITE_OTHER): Payer: 59 | Admitting: Nurse Practitioner

## 2021-06-27 VITALS — BP 108/70 | HR 88 | Temp 98.1°F | Ht 65.0 in | Wt 174.0 lb

## 2021-06-27 DIAGNOSIS — Z8709 Personal history of other diseases of the respiratory system: Secondary | ICD-10-CM

## 2021-06-27 DIAGNOSIS — Z87898 Personal history of other specified conditions: Secondary | ICD-10-CM

## 2021-06-27 DIAGNOSIS — Z8739 Personal history of other diseases of the musculoskeletal system and connective tissue: Secondary | ICD-10-CM | POA: Diagnosis not present

## 2021-06-27 DIAGNOSIS — Z7689 Persons encountering health services in other specified circumstances: Secondary | ICD-10-CM

## 2021-06-27 DIAGNOSIS — R5383 Other fatigue: Secondary | ICD-10-CM | POA: Diagnosis not present

## 2021-06-27 DIAGNOSIS — M62838 Other muscle spasm: Secondary | ICD-10-CM | POA: Diagnosis not present
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
## 2021-06-27 NOTE — Patient Instructions (Signed)
Endometriosis  Endometriosis is a condition in which a tissue similar to the endometrium grows in places outside the uterus. The endometrium is a tissue that forms the lining of the uterus. This tissue can grow in the organs that create the eggs (ovaries), the tubes that carry the eggs to the uterus (fallopian tubes), the vagina, and the bowel. This tissue most often grows on the ovaries and inner lining of the pelvic cavity (peritoneum). When the uterus sheds the endometrium every menstrual cycle, there is bleeding wherever these types of tissue are located. This can cause pain because blood is irritating to tissues that are not normally exposed to it. Endometriosis canalso make it harder for a woman to get pregnant. What are the causes? The cause of this condition is not known. What increases the risk? The following factors may make you more likely to develop this condition: Having a family history of endometriosis. Having never given birth. Starting your period at 10 years of age or younger. What are the signs or symptoms? Often, there are no symptoms of this condition. If you do have symptoms, they may: Vary depending on where the abnormal tissue is growing. Occur during your menstrual period (most often) or at the middle of your cycle. Come and go. You may have no symptoms during some months. Stop when you no longer have your monthly periods (menopause). Symptoms may include: Pain in the area between your hip bones (pelvis). Heavier bleeding during periods. Menstrual periods that happen more than once a month. Pain during sex. Pain in the back or abdomen. Painful bowel movements. Not being able to get pregnant. How is this diagnosed? This condition is diagnosed based on your symptoms and a physical exam. You may have tests, such as: Blood tests and urine tests to help rule out other causes. Ultrasound to look for tissues that are not normal. This is often done over your skin. It is  sometimes done through the vagina (transvaginal). X-ray of the lower bowel (barium enema). CT scan. MRI. To confirm the diagnosis, your health care provider may use a device with a small camera to check tissue inside your abdomen (laparoscopy). Abnormal tissue may be removed and checked in a lab (biopsy). How is this treated? There is no cure for this condition. Treatment focuses on controlling your symptoms. The type of treatment also depends on whether you want to become pregnant in the future. This condition may be treated with: Medicines. These may include: Medicines to relieve pain, including NSAIDs such as ibuprofen. Hormone therapy. This uses artificial hormones to slow the growth of the abnormal tissue. This may include hormonal birth control, such as pills. Surgery to remove the abnormal tissue. During surgery: Tissue may be removed using a laparoscope and a laser (laparoscopic laser treatment). The fallopian tubes, uterus, and ovaries may be removed (hysterectomy). This is done in very severe cases. Follow these instructions at home: Get regular exercise. Limit alcohol use. Eat a balanced diet. Avoid caffeine. Take over-the-counter and prescription medicines only as told by your health care provider. Keep all follow-up visits as told by your health care provider. This is important. Where to find more information [employer]: https://www.acog.org/ Office on Women's Health: https://www.womenshealth.gov/ Contact a health care provider if: You are having new pain or trouble controlling pain. You have problems getting pregnant. You have a fever. Get help right away if you have: Severe pain that does not get better with medicine. Severe nausea and vomiting, or   if you cannot eat or drink without vomiting. Pain that affects your abdomen only on the lower, right side. Pain in your abdomen that gets worse. Swelling in your abdomen. Blood in  your stool (feces). Summary Endometriosis is a condition in which a tissue similar to the endometrium grows in places outside the uterus. The endometrium is a tissue that forms the lining of the uterus. The cause of this condition is not known. This condition may be treated with medicines to relieve pain, hormone therapy, or surgery. If you have this condition, get regular exercise, limit alcohol use, and avoid caffeine. Get help right away if you have severe pain that does not get better with medicine, or if you have severe nausea and vomiting or blood in your stool. This information is not intended to replace advice given to you by your health care provider. Make sure you discuss any questions you have with your healthcare provider. Document Revised: 11/04/2019 Document Reviewed: 11/04/2019 Elsevier Patient Education  2021 [employer].  

## 2021-06-28 LAB — CBC WITH DIFFERENTIAL/PLATELET
Basophils Absolute: 0 10*3/uL (ref 0.0–0.2)
Basos: 1 %
EOS (ABSOLUTE): 0 10*3/uL (ref 0.0–0.4)
Eos: 0 %
Hematocrit: 40 % (ref 34.0–46.6)
Hemoglobin: 13.2 g/dL (ref 11.1–15.9)
Immature Grans (Abs): 0 10*3/uL (ref 0.0–0.1)
Immature Granulocytes: 1 %
Lymphocytes Absolute: 1.9 10*3/uL (ref 0.7–3.1)
Lymphs: 39 %
MCH: 28.1 pg (ref 26.6–33.0)
MCHC: 33 g/dL (ref 31.5–35.7)
MCV: 85 fL (ref 79–97)
Monocytes Absolute: 0.6 10*3/uL (ref 0.1–0.9)
Monocytes: 12 %
Neutrophils Absolute: 2.4 10*3/uL (ref 1.4–7.0)
Neutrophils: 47 %
Platelets: 310 10*3/uL (ref 150–450)
RBC: 4.7 x10E6/uL (ref 3.77–5.28)
RDW: 11.6 % — ABNORMAL LOW (ref 11.7–15.4)
WBC: 5 10*3/uL (ref 3.4–10.8)

## 2021-06-28 LAB — BMP8+EGFR
BUN/Creatinine Ratio: 6 — ABNORMAL LOW (ref 9–23)
BUN: 5 mg/dL — ABNORMAL LOW (ref 6–20)
CO2: 22 mmol/L (ref 20–29)
Calcium: 9.8 mg/dL (ref 8.7–10.2)
Chloride: 103 mmol/L (ref 96–106)
Creatinine, Ser: 0.79 mg/dL (ref 0.57–1.00)
Glucose: 83 mg/dL (ref 70–99)
Potassium: 4.8 mmol/L (ref 3.5–5.2)
Sodium: 140 mmol/L (ref 134–144)
eGFR: 102 mL/min/{1.73_m2} (ref >59)

## 2021-06-28 LAB — IRON,TIBC AND FERRITIN PANEL
Ferritin: 23 ng/mL (ref 15–150)
Iron Saturation: 37 % (ref 15–55)
Iron: 128 ug/dL (ref 27–159)
Total Iron Binding Capacity: 346 ug/dL (ref 250–450)
UIBC: 218 ug/dL (ref 131–425)

## 2021-06-28 LAB — TSH: TSH: 2.05 u[IU]/mL (ref 0.450–4.500)

## 2021-06-28 LAB — VITAMIN B12: Vitamin B-12: 446 pg/mL (ref 232–1245)

## 2021-07-04 LAB — SPECIMEN STATUS REPORT

## 2021-07-04 LAB — CK: Total CK: 74 U/L (ref 32–182)

## 2021-09-05 LAB — HM PAP SMEAR

## 2021-09-06 ENCOUNTER — Other Ambulatory Visit: Payer: Self-pay | Admitting: Obstetrics & Gynecology

## 2021-09-06 DIAGNOSIS — E049 Nontoxic goiter, unspecified: Secondary | ICD-10-CM

## 2021-09-27 ENCOUNTER — Ambulatory Visit
Admission: RE | Admit: 2021-09-27 | Discharge: 2021-09-27 | Disposition: A | Discharge status: Home / Self Care | Payer: 59 | Source: Ambulatory Visit | Attending: Obstetrics & Gynecology | Admitting: Obstetrics & Gynecology

## 2021-09-27 DIAGNOSIS — E049 Nontoxic goiter, unspecified: Secondary | ICD-10-CM

## 2021-10-06 ENCOUNTER — Ambulatory Visit
Admission: EM | Admit: 2021-10-06 | Discharge: 2021-10-06 | Disposition: A | Discharge status: Home / Self Care | Payer: 59 | Attending: Internal Medicine | Admitting: Internal Medicine

## 2021-10-06 ENCOUNTER — Other Ambulatory Visit: Payer: Self-pay

## 2021-10-06 DIAGNOSIS — J069 Acute upper respiratory infection, unspecified: Secondary | ICD-10-CM | POA: Diagnosis present

## 2021-10-06 LAB — POCT RAPID STREP A (OFFICE): Rapid Strep A Screen: NEGATIVE

## 2021-10-06 MED ORDER — ALBUTEROL SULFATE HFA 108 (90 BASE) MCG/ACT IN AERS: 2.0000 | INHALATION_SPRAY | Freq: Four times a day (QID) | RESPIRATORY_TRACT | 2 refills | Status: DC | PRN

## 2021-10-06 MED ORDER — BENZONATATE 100 MG PO CAPS: 100.0000 mg | ORAL_CAPSULE | Freq: Three times a day (TID) | ORAL | 0 refills | Status: DC | PRN

## 2021-10-06 NOTE — ED Provider Notes (Signed)
[facility]    CSN: [id number] Arrival date & time: 10/06/21  0943      History   Chief Complaint Chief Complaint  Patient presents with   Sore Throat    HPI Stacey Key is a 36 y.o. female presents urgent care today with complaints of sore throat.  Patient describes 5-day history of congestion, left ear pain and mildly productive cough.  Sore throat began last evening.  She denies any fever and chills have resolved since 3 days prior.  She denies any shortness of breath no chest did feel "tight".  No chest pain today.  Patient has history of asthma but has not noticed any wheezing.  She has not had to use her inhaler.  Again she denies any chest pain, abdominal pain, vomiting diarrhea.  Home COVID test was negative.   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   Dysmenorrhea 08/22/2016   Arthritis 06/25/2012   Ovarian cyst, complex 05/05/2012   GERD (gastroesophageal reflux disease) 11/13/2011    Past Surgical History:  Procedure Laterality Date   HERNIA REPAIR     LAPAROSCOPY N/A 08/22/2016   Procedure: LAPAROSCOPY OPERATIVE,excision of posterior culdesac endometriosis;  Surgeon: Stacey Manges, MD;  Location: [facility];  Service: Gynecology;  Laterality: N/A;   WISDOM TOOTH EXTRACTION      OB History     Gravida  0   Para      Term      Preterm      AB      Living         SAB      IAB      Ectopic      Multiple      Live Births               Home Medications     Prior to Admission medications   Medication Sig Start Date End Date Taking? Authorizing Provider  albuterol (VENTOLIN HFA) 108 (90 Base) MCG/ACT inhaler Inhale 2 puffs into the lungs every 6 (six) hours as needed for wheezing or shortness of breath. 10/06/21  Yes Stacey Sevin, NP  benzonatate (TESSALON PERLES) 100 MG capsule Take 1 capsule (100 mg total) by mouth 3 (three) times daily as needed for cough. 10/06/21 10/06/22 Yes Stacey Sevin, NP  albuterol (PROVENTIL) (2.5 MG/3ML) 0.083% nebulizer solution Take 3 mLs (2.5 mg total) by nebulization every 6 (six) hours as needed for wheezing or shortness of breath. 01/13/20   Stacey Foy, NP  cetirizine (ZYRTEC) 10 MG tablet Take 1 tablet (10 mg total) by mouth daily. 08/12/18   Stacey Moron, MD  fluticasone (FLONASE) 50 MCG/ACT nasal spray SPRAY 2 SPRAYS INTO EACH NOSTRIL EVERY DAY 02/10/21   Stacey Spatz, NP  levonorgestrel (MIRENA) 20 MCG/24HR IUD 1 each by Intrauterine route once.    [provider]  omeprazole (PRILOSEC) 20 MG capsule Take 1  capsule (20 mg total) by mouth daily. Patient not taking: Reported on 06/27/2021 02/08/20   Stacey Hy, DO    Family History Family History  Problem Relation Age of Onset   Anemia Mother    GER disease Mother    Hypertension Father    Hypertension Maternal Grandmother    Breast cancer Maternal Grandmother 16       over 46   Rheum arthritis Maternal Grandmother    Hypertension Maternal Grandfather    Clotting disorder Maternal Grandfather    Skin cancer Maternal Grandfather    Breast cancer Paternal Grandmother    Hypertension Paternal Grandfather    Lung cancer Paternal Grandfather    Asthma Maternal Aunt    Other Neg Hx     Social History Social History   Tobacco Use   Smoking status: Never   Smokeless tobacco: Never  Substance Use Topics   Alcohol use: No   Drug use: No     Allergies   Patient has no known allergies.   Review of Systems As stated in HPI  otherwise negative   Physical Exam Triage Vital Signs ED Triage Vitals  Enc Vitals Group     BP 10/06/21 1058 120/80     Pulse Rate 10/06/21 1058 77     Resp 10/06/21 1058 18     Temp 10/06/21 1058 98.7 F (37.1 C)     Temp Source 10/06/21 1058 Oral     SpO2 10/06/21 1058 95 %     Weight --      Height --      Head Circumference --      Peak Flow --      Pain Score 10/06/21 1057 8     Pain Loc --      Pain Edu? --      Excl. in [place]? --    No data found.  Updated Vital Signs BP 120/80 (BP Location: Right Arm)    Pulse 77    Temp 98.7 F (37.1 C) (Oral)    Resp 18    SpO2 95%   Visual Acuity Right Eye Distance:   Left Eye Distance:   Bilateral Distance:    Right Eye Near:   Left Eye Near:    Bilateral Near:     Physical Exam Constitutional:      General: She is not in acute distress.    Appearance: She is well-developed. She is not ill-appearing.  HENT:     Right Ear: A middle ear effusion is present. Tympanic membrane is not erythematous.     Left Ear: A middle ear effusion is present. Tympanic membrane is not erythematous.     Ears:     Comments: + Air-fluid levels bilaterally to TMs, no erythema    Nose: Congestion present.     Mouth/Throat:     Mouth: Mucous membranes are moist.     Pharynx: Posterior oropharyngeal erythema present. No oropharyngeal exudate.     Tonsils: No tonsillar exudate or tonsillar abscesses.  Cardiovascular:     Rate and Rhythm: Normal rate and regular rhythm.     Heart sounds: No murmur heard.   No friction rub. No gallop.  Pulmonary:     Effort: Pulmonary effort is normal.     Breath sounds: Normal breath sounds. No wheezing, rhonchi or rales.  Abdominal:     General: Bowel sounds are normal.     Palpations: Abdomen is soft.  Musculoskeletal:     Cervical back:  Neck supple.  Lymphadenopathy:     Cervical: No cervical adenopathy.  Skin:    General: Skin is warm and dry.  Neurological:     General: No focal deficit present.      Mental Status: She is alert.  Psychiatric:        Mood and Affect: Mood normal.        Behavior: Behavior normal.     UC Treatments / Results  Labs (all labs ordered are listed, but only abnormal results are displayed) Labs Reviewed  NOVEL CORONAVIRUS, NAA  CULTURE, GROUP A STREP [facility])  POCT RAPID STREP A (OFFICE)    EKG   Radiology No results found.  Procedures Procedures (including critical care time)  Medications Ordered in UC Medications - No data to display  Initial Impression / Assessment and Plan / UC Course  I have reviewed the triage vital signs and the nursing notes.  Pertinent labs & imaging results that were available during my care of the patient were reviewed by me and considered in my medical decision making (see chart for details).  Viral URI with cough -VSS, nontoxic appearing.  Exam consistent with viral URI with negative rapid strep -No findings on exam to suggest need for antibiotics or steroid treatment -Will send throat culture to determine need for any antibiotic treatment.  COVID PCR testing with strict follow-up precautions discussed -Symptomatic treatment with cetirizine daily, Flonase, Tessalon as needed -Push fluids, rest  Reviewed expections re: course of current medical issues. Questions answered. Outlined signs and symptoms indicating need for more acute intervention. Pt verbalized understanding. AVS given   Final Clinical Impressions(s) / UC Diagnoses   Final diagnoses:  Viral URI with cough     Discharge Instructions      Your rapid strep test today was negative.  I will send this for culture to ensure you do not need to be treated.  We will notify you if you need an antibiotic.  I have also screened you today for COVID.  Those results should be available on your MyChart in the next 2 to 3 days.  Use your albuterol inhaler as previously prescribed for any shortness of breath.  Your lungs sound clear on exam today.  As long  as you remain fever free there is no need to isolate.  Use cetirizine and Flonase as we discussed to help with congestion.  Use cough suppressant as prescribed.  Please follow-up for any worsening or persistent symptoms.     ED Prescriptions     Medication Sig Dispense Auth. Provider   benzonatate (TESSALON PERLES) 100 MG capsule Take 1 capsule (100 mg total) by mouth 3 (three) times daily as needed for cough. 30 capsule Stacey Sevin, NP   albuterol (VENTOLIN HFA) 108 (90 Base) MCG/ACT inhaler Inhale 2 puffs into the lungs every 6 (six) hours as needed for wheezing or shortness of breath. 8 g Stacey Sevin, NP      PDMP not reviewed this encounter.   Stacey Sevin, NP 10/07/21 1150

## 2021-10-06 NOTE — Discharge Instructions (Signed)
Your rapid strep test today was negative.  I will send this for culture to ensure you do not need to be treated.  We will notify you if you need an antibiotic.  I have also screened you today for COVID.  Those results should be available on your MyChart in the next 2 to 3 days.  Use your albuterol inhaler as previously prescribed for any shortness of breath.  Your lungs sound clear on exam today.  As long as you remain fever free there is no need to isolate.  Use cetirizine and Flonase as we discussed to help with congestion.  Use cough suppressant as prescribed.  Please follow-up for any worsening or persistent symptoms.

## 2021-10-06 NOTE — ED Triage Notes (Signed)
Pt c/o sore throat, and SOB at times.  Started: Monday

## 2021-10-08 LAB — SARS-COV-2, NAA 2 DAY TAT

## 2021-10-08 LAB — NOVEL CORONAVIRUS, NAA: SARS-CoV-2, NAA: NOT DETECTED

## 2021-10-09 LAB — CULTURE, GROUP A STREP (THRC)

## 2021-10-16 ENCOUNTER — Ambulatory Visit: Admit: 2021-10-16 | Discharge status: Against Medical Advice | Payer: BC Managed Care – PPO

## 2021-10-16 DIAGNOSIS — E049 Nontoxic goiter, unspecified: Secondary | ICD-10-CM | POA: Insufficient documentation

## 2021-10-17 ENCOUNTER — Ambulatory Visit
Admission: RE | Admit: 2021-10-17 | Discharge: 2021-10-17 | Disposition: A | Discharge status: Home / Self Care | Payer: BC Managed Care – PPO | Source: Ambulatory Visit | Attending: Emergency Medicine | Admitting: Emergency Medicine

## 2021-10-17 ENCOUNTER — Other Ambulatory Visit: Payer: Self-pay

## 2021-10-17 VITALS — BP 125/81 | HR 76 | Temp 98.2°F | Resp 18

## 2021-10-17 DIAGNOSIS — L732 Hidradenitis suppurativa: Secondary | ICD-10-CM

## 2021-10-17 DIAGNOSIS — L02411 Cutaneous abscess of right axilla: Secondary | ICD-10-CM | POA: Diagnosis not present

## 2021-10-17 MED ORDER — CEFTRIAXONE SODIUM 1 G IJ SOLR
1.0000 g | Freq: Once | INTRAMUSCULAR | Status: AC
  Administered 2021-10-17: 1 g via INTRAMUSCULAR

## 2021-10-17 MED ORDER — DOXYCYCLINE HYCLATE 100 MG PO CAPS: 100.0000 mg | ORAL_CAPSULE | Freq: Two times a day (BID) | ORAL | 0 refills | Status: DC

## 2021-10-17 MED ORDER — CLINDAMYCIN PHOSPHATE 1 % EX GEL: Freq: Two times a day (BID) | CUTANEOUS | 0 refills | Status: DC

## 2021-10-17 NOTE — ED Provider Notes (Signed)
[facility]    CSN: [id number] Arrival date & time: 10/17/21  1213    HISTORY   Chief Complaint  Patient presents with   APPT: 1230   Abscess   HPI Stacey Key is a 36 y.o. female. Patient presents to [facility] for evaluation of abscess to right axilla which started at the begiining of the year, states she saw her dermatologist who did a steroid injection into the lesion, states he was doing okay but now the lesion has grown significantly in the last two days.  States she has another derm appointment at the end of this month, but can't wait that long due to the area being very swollen, tender.  Denies fevers.  Patient states she did use deodorant on it yesterday, states it was too painful to apply today, states she avoids shaving as well.  Patient states she been using a topical gel as well prescribed by her dermatologist.  The history is provided by the patient.  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   Dysmenorrhea 08/22/2016   Arthritis 06/25/2012   Ovarian cyst, complex 05/05/2012   GERD (gastroesophageal reflux disease) 11/13/2011   Past Surgical History:  Procedure Laterality Date   HERNIA REPAIR     LAPAROSCOPY N/A 08/22/2016   Procedure: LAPAROSCOPY OPERATIVE,excision of posterior culdesac endometriosis;  Surgeon: Eldred Manges, MD;  Location: [facility];  Service: Gynecology;  Laterality: N/A;   WISDOM TOOTH EXTRACTION     OB History     Gravida  0   Para      Term      Preterm       AB      Living         SAB      IAB      Ectopic      Multiple      Live Births             Home Medications    Prior to Admission medications   Medication Sig Start Date End Date Taking? Authorizing Provider  albuterol (PROVENTIL) (2.5 MG/3ML) 0.083% nebulizer solution Take 3 mLs (2.5 mg total) by nebulization every 6 (six) hours as needed for wheezing or shortness of breath. 01/13/20   Fenton Foy, NP  albuterol (VENTOLIN HFA) 108 (90 Base) MCG/ACT inhaler Inhale 2 puffs into the lungs every 6 (six) hours as needed for wheezing or shortness of breath. 10/06/21   Rudolpho Sevin, NP  benzonatate (TESSALON PERLES) 100 MG capsule Take 1 capsule (100 mg total) by mouth 3 (three) times daily as needed for cough. 10/06/21 10/06/22  Rudolpho Sevin, NP  cetirizine (ZYRTEC) 10 MG tablet Take 1 tablet (10 mg total) by mouth daily. 08/12/18   Forrest Moron, MD  fluticasone (FLONASE) 50 MCG/ACT nasal spray SPRAY 2 SPRAYS INTO EACH NOSTRIL EVERY DAY 02/10/21   Magdalen Spatz, NP  levonorgestrel (MIRENA) 20 MCG/24HR IUD  1 each by Intrauterine route once.    [provider]  omeprazole (PRILOSEC) 20 MG capsule Take 1 capsule (20 mg total) by mouth daily. Patient not taking: Reported on 06/27/2021 02/08/20   Julian Hy, DO    Family History Family History  Problem Relation Age of Onset   Anemia Mother    GER disease Mother    Hypertension Father    Hypertension Maternal Grandmother    Breast cancer Maternal Grandmother 48       over 56   Rheum arthritis Maternal Grandmother    Hypertension Maternal Grandfather    Clotting disorder Maternal Grandfather    Skin cancer Maternal Grandfather    Breast cancer Paternal Grandmother    Hypertension Paternal Grandfather    Lung cancer Paternal Grandfather    Asthma Maternal Aunt    Other Neg Hx    Social History Social History   Tobacco Use   Smoking status: Never   Smokeless tobacco: Never  Substance Use Topics    Alcohol use: No   Drug use: No   Allergies   Patient has no known allergies.  Review of Systems Review of Systems Pertinent findings noted in history of present illness.   Physical Exam Triage Vital Signs ED Triage Vitals  Enc Vitals Group     BP 07/28/21 0827 (!) 147/82     Pulse Rate 07/28/21 0827 72     Resp 07/28/21 0827 18     Temp 07/28/21 0827 98.3 F (36.8 C)     Temp Source 07/28/21 0827 Oral     SpO2 07/28/21 0827 98 %     Weight --      Height --      Head Circumference --      Peak Flow --      Pain Score 07/28/21 0826 5     Pain Loc --      Pain Edu? --      Excl. in [place]? --   No data found.  Updated Vital Signs BP 125/81 (BP Location: Right Arm)    Pulse 76    Temp 98.2 F (36.8 C) (Oral)    Resp 18    LMP 10/04/2021    SpO2 97%   Physical Exam Vitals and nursing note reviewed.  Constitutional:      General: She is not in acute distress.    Appearance: Normal appearance. She is not ill-appearing.  HENT:     Head: Normocephalic and atraumatic.  Eyes:     General: Lids are normal.        Right eye: No discharge.        Left eye: No discharge.     Extraocular Movements: Extraocular movements intact.     Conjunctiva/sclera: Conjunctivae normal.     Right eye: Right conjunctiva is not injected.     Left eye: Left conjunctiva is not injected.  Neck:     Trachea: Trachea and phonation normal.  Cardiovascular:     Rate and Rhythm: Normal rate and regular rhythm.     Pulses: Normal pulses.     Heart sounds: Normal heart sounds. No murmur heard.   No friction rub. No gallop.  Pulmonary:     Effort: Pulmonary effort is normal. No accessory muscle usage, prolonged expiration or respiratory distress.     Breath sounds: Normal breath sounds. No stridor, decreased air movement or transmitted upper airway sounds. No decreased breath sounds, wheezing, rhonchi or rales.  Chest:  Chest wall: No tenderness.  Musculoskeletal:        General: Normal range of  motion.     Cervical back: Normal range of motion and neck supple. Normal range of motion.  Lymphadenopathy:     Cervical: No cervical adenopathy.  Skin:    General: Skin is warm and dry.     Findings: Lesion (Two lesions concerning for hidradenitis suppurativa without superficial erythema.  There does appear to be tunneling between the 2 lesions beneath the skin.  Both are located in the right axilla at the axillary fold.  Both are very tender to palpation.) present. No erythema or rash.  Neurological:     General: No focal deficit present.     Mental Status: She is alert and oriented to person, place, and time.  Psychiatric:        Mood and Affect: Mood normal.        Behavior: Behavior normal.    Visual Acuity Right Eye Distance:   Left Eye Distance:   Bilateral Distance:    Right Eye Near:   Left Eye Near:    Bilateral Near:     [facility] Couse / Diagnostics / Procedures:    EKG  Radiology No results found.  Procedures Incision and Drainage  Date/Time: 10/17/2021 1:57 PM Performed by: Lynden Oxford Scales, PA-C Authorized by: Lynden Oxford Scales, PA-C   Consent:    Consent obtained:  Verbal   Risks discussed:  Bleeding, infection, incomplete drainage and pain   Alternatives discussed:  No treatment, alternative treatment, observation and delayed treatment Universal protocol:    Procedure explained and questions answered to patient or proxy's satisfaction: yes     Patient identity confirmed:  Verbally with patient Location:    Indications for incision and drainage: Hidradenitis suppurativa lesion.   Size:  3   Location: Right axilla. Pre-procedure details:    Skin preparation:  Chlorhexidine Sedation:    Sedation type:  None Anesthesia:    Anesthesia method:  None Procedure type:    Complexity:  Simple Procedure details:    Ultrasound guidance: no     Needle aspiration: yes     Needle size:  18 G   Incision types:  Elliptical   Incision depth:   Subcutaneous   Wound management:  Probed and deloculated   Drainage:  Bloody   Drainage amount:  Scant   Packing materials:  None Post-procedure details:    Procedure completion:  Procedure terminated at patient's request Comments:     Patient declined repeat attempted aspiration at opposite location of lesion secondary to pain and anxiety. (including critical care time)  UC Diagnoses / Final Clinical Impressions(s)   I have reviewed the triage vital signs and the nursing notes.  Pertinent labs & imaging results that were available during my care of the patient were reviewed by me and considered in my medical decision making (see chart for details).    Final diagnoses:  Hidradenitis suppurativa of right axilla  Cutaneous abscess of right axilla   Lesion could not be completely drained.  Patient was provided with a ceftriaxone injection in an attempt to alleviate infectious burden of lesion quickly, patient to follow with 10-day course of doxycycline and to keep her appointment with her dermatologist.  Return precautions advised.  Clindagel renewed for her. ED Prescriptions     Medication Sig Dispense Auth. Provider   doxycycline (VIBRAMYCIN) 100 MG capsule Take 1 capsule (100 mg total) by mouth 2 (two) times daily. 20 capsule  Lynden Oxford Scales, PA-C   clindamycin (CLINDAGEL) 1 % gel Apply topically 2 (two) times daily. 30 g Lynden Oxford Scales, PA-C      PDMP not reviewed this encounter.  Pending results:  Labs Reviewed - No data to display  Medications Ordered in UC: Medications  cefTRIAXone (ROCEPHIN) injection 1 g (1 g Intramuscular Given 10/17/21 1335)    Disposition Upon Discharge:  Condition: stable for discharge home Home: take medications as prescribed; routine discharge instructions as discussed; follow up as advised.  Patient presented with an acute illness with associated systemic symptoms and significant discomfort requiring urgent management. In my  opinion, this is a condition that a prudent lay person (someone who possesses an average knowledge of health and medicine) may potentially expect to result in complications if not addressed urgently such as respiratory distress, impairment of bodily function or dysfunction of bodily organs.   Routine symptom specific, illness specific and/or disease specific instructions were discussed with the patient and/or caregiver at length.   As such, the patient has been evaluated and assessed, work-up was performed and treatment was provided in alignment with urgent care protocols and evidence based medicine.  Patient/parent/caregiver has been advised that the patient may require follow up for further testing and treatment if the symptoms continue in spite of treatment, as clinically indicated and appropriate.  If the patient was tested for COVID-19, Influenza and/or RSV, then the patient/parent/guardian was advised to isolate at home pending the results of his/her diagnostic coronavirus test and potentially longer if theyre positive. I have also advised pt that if his/her COVID-19 test returns positive, it's recommended to self-isolate for at least 10 days after symptoms first appeared AND until fever-free for 24 hours without fever reducer AND other symptoms have improved or resolved. Discussed self-isolation recommendations as well as instructions for household member/close contacts as per the [facility] and [place] [facility], and also gave patient the [facility] packet with this information.  Patient/parent/caregiver has been advised to return to the [facility] or PCP in 3-5 days if no better; to PCP or the Emergency Department if new signs and symptoms develop, or if the current signs or symptoms continue to change or worsen for further workup, evaluation and treatment as clinically indicated and appropriate  The patient will follow up with their current PCP if and as advised. If the patient does not currently have a PCP we will assist  them in obtaining one.   The patient may need specialty follow up if the symptoms continue, in spite of conservative treatment and management, for further workup, evaluation, consultation and treatment as clinically indicated and appropriate.   Patient/parent/caregiver verbalized understanding and agreement of plan as discussed.  All questions were addressed during visit.  Please see discharge instructions below for further details of plan.  Discharge Instructions:   Discharge Instructions      I am very sorry I was unable to aspirate any fluid from the lesion under your arm.  I do believe that the hidradenitis has tunneled beneath your skin and that you have more than 1 lesion that is connected to each other, this might be why the lesion has become so painful and enlarged so quickly.  For rapid relief of possible bacterial infection in the lesion, you were provided with an injection of ceftriaxone, a potent antibiotic that kills common bacteriae that cause superficial infections, such as strep and staph.  Please follow this up with a 10-day course of doxycycline, 1 tablet twice daily.  Guidelines recommend  doxycycline initially instead of clindamycin orally, clindamycin is recommended topically so please do continue using your clindamycin gel.  As a courtesy, I provided you with a refill of this gel as well.  In the future, if you feel that you have another lesion brewing, I do perform steroid injections for patients who are unable to get in quickly with their dermatologist.  As far as self-care, I believe you are doing everything that you are supposed to do to prevent these lesions from reoccurring, for a practical standpoint I do not think I have anything to add other than recommending that you apply a warm moist compress to the area 4 times daily.  I provided you with an educational printout regarding these lesions, it is my hope that this might be helpful.  Thank you for visiting urgent  care today.      This office note has been dictated using Museum/gallery curator.  Unfortunately, and despite my best efforts, this method of dictation can sometimes lead to occasional typographical or grammatical errors.  I apologize in advance if this occurs.     Lynden Oxford Scales, PA-C 10/17/21 1402

## 2021-10-17 NOTE — Discharge Instructions (Addendum)
I am very sorry I was unable to aspirate any fluid from the lesion under your arm.  I do believe that the hidradenitis has tunneled beneath your skin and that you have more than 1 lesion that is connected to each other, this might be why the lesion has become so painful and enlarged so quickly.  For rapid relief of possible bacterial infection in the lesion, you were provided with an injection of ceftriaxone, a potent antibiotic that kills common bacteriae that cause superficial infections, such as strep and staph.  Please follow this up with a 10-day course of doxycycline, 1 tablet twice daily.  Guidelines recommend doxycycline initially instead of clindamycin orally, clindamycin is recommended topically so please do continue using your clindamycin gel.  As a courtesy, I provided you with a refill of this gel as well.  In the future, if you feel that you have another lesion brewing, I do perform steroid injections for patients who are unable to get in quickly with their dermatologist.  As far as self-care, I believe you are doing everything that you are supposed to do to prevent these lesions from reoccurring, for a practical standpoint I do not think I have anything to add other than recommending that you apply a warm moist compress to the area 4 times daily.  I provided you with an educational printout regarding these lesions, it is my hope that this might be helpful.  Thank you for visiting urgent care today.

## 2021-10-17 NOTE — ED Triage Notes (Signed)
Patient presents to [facility] for evaluation of abscess to right axilla which started at the begiining of the year, but it has grown significantly in the last two days.  States she has a derm appointment at the end of the month, but can't wait that long.  Denies fevers

## 2021-11-02 ENCOUNTER — Encounter: Payer: 59 | Admitting: Nurse Practitioner

## 2021-12-17 ENCOUNTER — Emergency Department (HOSPITAL_COMMUNITY)
Admission: EM | Admit: 2021-12-17 | Discharge: 2021-12-17 | Disposition: A | Discharge status: Home / Self Care | Payer: 59 | Attending: Emergency Medicine | Admitting: Emergency Medicine

## 2021-12-17 ENCOUNTER — Encounter (HOSPITAL_COMMUNITY): Payer: Self-pay | Admitting: Emergency Medicine

## 2021-12-17 DIAGNOSIS — R519 Headache, unspecified: Secondary | ICD-10-CM | POA: Diagnosis not present

## 2021-12-17 DIAGNOSIS — R55 Syncope and collapse: Secondary | ICD-10-CM | POA: Diagnosis present

## 2021-12-17 DIAGNOSIS — Y9222 Religious institution as the place of occurrence of the external cause: Secondary | ICD-10-CM | POA: Diagnosis not present

## 2021-12-17 DIAGNOSIS — W010XXA Fall on same level from slipping, tripping and stumbling without subsequent striking against object, initial encounter: Secondary | ICD-10-CM | POA: Insufficient documentation

## 2021-12-17 DIAGNOSIS — J45909 Unspecified asthma, uncomplicated: Secondary | ICD-10-CM | POA: Insufficient documentation

## 2021-12-17 DIAGNOSIS — R42 Dizziness and giddiness: Secondary | ICD-10-CM | POA: Diagnosis not present

## 2021-12-17 LAB — BASIC METABOLIC PANEL
Anion gap: 9 (ref 5–15)
BUN: 8 mg/dL (ref 6–20)
CO2: 24 mmol/L (ref 22–32)
Calcium: 9.3 mg/dL (ref 8.9–10.3)
Chloride: 103 mmol/L (ref 98–111)
Creatinine, Ser: 0.74 mg/dL (ref 0.44–1.00)
GFR, Estimated: >60 mL/min (ref >60)
Glucose, Bld: 87 mg/dL (ref 70–99)
Potassium: 3.6 mmol/L (ref 3.5–5.1)
Sodium: 136 mmol/L (ref 135–145)

## 2021-12-17 LAB — URINALYSIS, ROUTINE W REFLEX MICROSCOPIC
Bilirubin Urine: NEGATIVE
Glucose, UA: NEGATIVE mg/dL
Ketones, ur: 15 mg/dL — AB
Leukocytes,Ua: NEGATIVE
Nitrite: NEGATIVE
Protein, ur: NEGATIVE mg/dL
Specific Gravity, Urine: <1.005 — ABNORMAL LOW (ref 1.005–1.030)
pH: 6.5 (ref 5.0–8.0)

## 2021-12-17 LAB — CBG MONITORING, ED: Glucose-Capillary: 70 mg/dL (ref 70–99)

## 2021-12-17 LAB — CBC
HCT: 39.7 % (ref 36.0–46.0)
Hemoglobin: 13 g/dL (ref 12.0–15.0)
MCH: 29.2 pg (ref 26.0–34.0)
MCHC: 32.7 g/dL (ref 30.0–36.0)
MCV: 89.2 fL (ref 80.0–100.0)
Platelets: 301 10*3/uL (ref 150–400)
RBC: 4.45 MIL/uL (ref 3.87–5.11)
RDW: 12.4 % (ref 11.5–15.5)
WBC: 5.1 10*3/uL (ref 4.0–10.5)
nRBC: 0 % (ref 0.0–0.2)

## 2021-12-17 LAB — URINALYSIS, MICROSCOPIC (REFLEX)

## 2021-12-17 LAB — I-STAT BETA HCG BLOOD, ED (MC, WL, AP ONLY): I-stat hCG, quantitative: <5 m[IU]/mL (ref <5)

## 2021-12-17 NOTE — ED Provider Triage Note (Signed)
Emergency Medicine Provider Triage Evaluation Note ? ?Stacey Key , a 36 y.o. female  was evaluated in triage.  Pt complains of syncopal episode while in church. She reports she wasn't feeling well and stood up to leave when she passed out. She reported some lightheadedness, not a room spinning sensation. The patient reports she had not had anything to eat or drink since yesterday afternoon. She denies any chest pain, SOB, or headache. Reports some right sided neck pain . ? ?Review of Systems  ?Positive: Neck pain, lightheadedness, syncope ?Negative: Chest pain, SOB, headache ? ?Physical Exam  ?BP 119/84 (BP Location: Right Arm)   Pulse 87   Temp 98.9 ?F (37.2 ?C) (Oral)   Resp 16   SpO2 99%  ?Gen:   Awake, no distress   ?Resp:  Normal effort  ?MSK:   Moves extremities without difficulty  ?Other:  Normal speech and answering questions appropriately ? ?Medical Decision Making  ?Medically screening exam initiated at 2:16 PM.  Appropriate orders placed.  Stacey Key was informed that the remainder of the evaluation will be completed by another provider, this initial triage assessment does not replace that evaluation, and the importance of remaining in the ED until their evaluation is complete. ? ?Syncope work up ordered ?  ?Sherrell Puller, PA-C ?12/17/21 1422 ? ?

## 2021-12-17 NOTE — ED Notes (Signed)
I provided reinforced discharge education based off of discharge instructions. Pt acknowledged and understood my education. Pt had no further questions/concerns for provider/myself.  °

## 2021-12-17 NOTE — ED Provider Notes (Signed)
?[facility] ?Provider Note ? ? ?CSN: [id number] ?Arrival date & time: 12/17/21  1351 ? ?  ? ?History ? ?Chief Complaint  ?Patient presents with  ? Loss of Consciousness  ? ? ?Stacey Key is a 36 y.o. female with past medical history significant for acid reflux, asthma who presents with concern for dizziness, syncopal episode at church this morning.  She reports that she stood up and fell backwards after sitting.  She denies any chest pain, shortness of breath, nausea.  She reports that she still feels a little bit lightheaded with a mild headache.  She reports that she did not have anything to eat or drink prior to trip today, when he had some small meals last few days.  She reports that she has just been too busy to eat like normal.  No family history of sudden cardiac death.  She reports that she has had 1 episode of previous syncopal episode. ? ? ?Loss of Consciousness ? ?  ? ?Home Medications ?Prior to Admission medications   ?Medication Sig Start Date End Date Taking? Authorizing Provider  ?albuterol (PROVENTIL) (2.5 MG/3ML) 0.083% nebulizer solution Take 3 mLs (2.5 mg total) by nebulization every 6 (six) hours as needed for wheezing or shortness of breath. 01/13/20   Fenton Foy, NP  ?albuterol (VENTOLIN HFA) 108 (90 Base) MCG/ACT inhaler Inhale 2 puffs into the lungs every 6 (six) hours as needed for wheezing or shortness of breath. 10/06/21   Rudolpho Sevin, NP  ?cetirizine (ZYRTEC) 10 MG tablet Take 1 tablet (10 mg total) by mouth daily. 08/12/18   Forrest Moron, MD  ?clindamycin (CLINDAGEL) 1 % gel Apply topically 2 (two) times daily. 10/17/21   Lynden Oxford Scales, PA-C  ?doxycycline (VIBRAMYCIN) 100 MG capsule Take 1 capsule (100 mg total) by mouth 2 (two) times daily. 10/17/21   Lynden Oxford Scales, PA-C  ?levonorgestrel (MIRENA) 20 MCG/24HR IUD 1 each by Intrauterine route once.    [provider]  ?   ? ?Allergies    ?Patient has no known  allergies.   ? ?Review of Systems   ?Review of Systems  ?Cardiovascular:  Positive for syncope.  ?Neurological:  Positive for syncope.  ?All other systems reviewed and are negative. ? ?Physical Exam ?Updated Vital Signs ?BP 107/70   Pulse 88   Temp 98.2 ?F (36.8 ?C) (Oral)   Resp 18   SpO2 99%  ?Physical Exam ?Vitals and nursing note reviewed.  ?Constitutional:   ?   General: She is not in acute distress. ?   Appearance: Normal appearance.  ?HENT:  ?   Head: Normocephalic and atraumatic.  ?Eyes:  ?   General:     ?   Right eye: No discharge.     ?   Left eye: No discharge.  ?Cardiovascular:  ?   Rate and Rhythm: Normal rate and regular rhythm.  ?   Heart sounds: No murmur heard. ?  No friction rub. No gallop.  ?Pulmonary:  ?   Effort: Pulmonary effort is normal.  ?   Breath sounds: Normal breath sounds.  ?Abdominal:  ?   General: Bowel sounds are normal.  ?   Palpations: Abdomen is soft.  ?Musculoskeletal:  ?   Comments: Tenderness to palpation of the right shoulder/neck in the trapezius muscle distribution.  No midline spinal tenderness, no tenderness of the bony prominences of the right shoulder.  ?Skin: ?   General: Skin is warm and dry.  ?  Capillary Refill: Capillary refill takes less than 2 seconds.  ?Neurological:  ?   Mental Status: She is alert and oriented to person, place, and time.  ?   Comments: Moves all 4 limbs spontaneously, no lateral facial droop, dysarthria.  Alert and oriented x4.  Romberg negative, gait normal.  ?Psychiatric:     ?   Mood and Affect: Mood normal.     ?   Behavior: Behavior normal.  ? ? ?ED Results / Procedures / Treatments   ?Labs ?(all labs ordered are listed, but only abnormal results are displayed) ?Labs Reviewed  ?URINALYSIS, ROUTINE W REFLEX MICROSCOPIC - Abnormal; Notable for the following components:  ?    Result Value  ? Specific Gravity, Urine <1.005 (*)   ? Hgb urine dipstick TRACE (*)   ? Ketones, ur 15 (*)   ? All other components within normal limits   ?URINALYSIS, MICROSCOPIC (REFLEX) - Abnormal; Notable for the following components:  ? Bacteria, UA RARE (*)   ? All other components within normal limits  ?BASIC METABOLIC PANEL  ?CBC  ?CBG MONITORING, ED  ?I-STAT BETA HCG BLOOD, ED ([facility], [facility], [facility] ONLY)  ? ? ?EKG ?EKG Interpretation ? ?Date/Time:  Sunday December 17 2021 14:04:15 EDT ?Ventricular Rate:  101 ?PR Interval:  179 ?QRS Duration: 80 ?QT Interval:  330 ?QTC Calculation: 428 ?R Axis:   71 ?Text Interpretation: Sinus tachycardia Probable left atrial enlargement RSR' in V1 or V2, probably normal variant Borderline T abnormalities, anterior leads Confirmed by Dene Gentry [phone]) on 12/17/2021 3:03:00 PM ? ?Radiology ?No results found. ? ?Procedures ?Procedures  ? ? ?Medications Ordered in ED ?Medications - No data to display ? ?ED Course/ Medical Decision Making/ A&P ?  ? ?                        ?Medical Decision Making ?Amount and/or Complexity of Data Reviewed ?Labs: ordered. ? ? ?This patient presents to the ED for concern of syncope and collapse, this involves an extensive number of treatment options, and is a complaint that carries with it a high risk of complications and morbidity. The emergent differential diagnosis prior to evaluation includes, but is not limited to, cardiac arrhythmia, dehydration, vasovagal syncope, atypical seizure activity versus other..  ? ?This is not an exhaustive differential.  ? ?Past Medical History / Co-morbidities / Social History: ?Noncontributory ? ?Additional history: ?Additional history obtained from patient's fianc?.  ? ?Physical Exam: ?Physical exam performed. The pertinent findings include: No focal neurodeficits, well-appearing patient who moves all limbs spontaneously.  Romberg negative, gait normal.  Stable vital signs. ? ?Lab Tests: ?I ordered, and personally interpreted labs.  The pertinent results include: Normal CBG, BMP, CBC, negative beta-hCG.  Urinalysis is with trace hemoglobin, some ketones which may  indicate active menstruation, as well as some dehydration.  No evidence of urinary tract infection. ? ?Cardiac Monitoring:  ?The patient was maintained on a cardiac monitor.  My attending physician Dr. Francia Greaves viewed and interpreted the cardiac monitored which showed an underlying rhythm of: Normal sinus rhythm, no evidence of delta wave, or other indication of WPW, LGL ? ?Disposition: ?After consideration of the diagnostic results and the patients response to treatment, I feel that patient appears stable for discharge, I am suspicious that her syncope today was secondary to decreased intake of food, water, and sudden standing.  Minimal clinical concern for cardiogenic arrhythmia, or seizure activity as patient with no focal neurodeficits, no postictal period,  no description of tonic-clonic activity.  Encouraged food, water, rest, patient discharged in stable condition, return precautions given.  ? ?Final Clinical Impression(s) / ED Diagnoses ?Final diagnoses:  ?Syncope and collapse  ? ? ?Rx / DC Orders ?ED Discharge Orders   ? ? None  ? ?  ? ? ?  ?Anselmo Pickler, PA-C ?12/17/21 1641 ? ?  ?Valarie Merino, MD ?12/17/21 2255 ? ?

## 2021-12-17 NOTE — Discharge Instructions (Addendum)
I recommend you plenty fluids, eat a meal, and follow-up with your primary care provider.  Please return to the emergency department if you have any additional concerns. ?

## 2021-12-17 NOTE — ED Triage Notes (Addendum)
Per EMS, patient from church, started feeling dizzy with syncopal episode. Fell backwards to floor from sitting. Denies chest pain, SOB, nausea. Reports continued dizziness. ? ?BP 126/85 ?HR 100 ?CBG 86 ? ?20g L AC ?

## 2021-12-18 ENCOUNTER — Telehealth: Payer: Self-pay

## 2021-12-18 NOTE — Telephone Encounter (Signed)
Transition Care Management Unsuccessful Follow-up Telephone Call ? ?Date of discharge and from where:  12/17/2021  ? ?Attempts:  1st Attempt ? ?Reason for unsuccessful TCM follow-up call:  Left voice message ? ?  ?

## 2022-01-02 ENCOUNTER — Encounter: Payer: Self-pay | Admitting: Family Medicine

## 2022-01-02 ENCOUNTER — Ambulatory Visit (INDEPENDENT_AMBULATORY_CARE_PROVIDER_SITE_OTHER): Payer: 59 | Admitting: Family Medicine

## 2022-01-02 VITALS — BP 128/80 | HR 78 | Resp 12 | Ht 65.0 in | Wt 171.2 lb

## 2022-01-02 DIAGNOSIS — R1310 Dysphagia, unspecified: Secondary | ICD-10-CM

## 2022-01-02 DIAGNOSIS — R519 Headache, unspecified: Secondary | ICD-10-CM | POA: Diagnosis not present

## 2022-01-02 DIAGNOSIS — H60502 Unspecified acute noninfective otitis externa, left ear: Secondary | ICD-10-CM | POA: Diagnosis not present

## 2022-01-02 DIAGNOSIS — H8112 Benign paroxysmal vertigo, left ear: Secondary | ICD-10-CM | POA: Diagnosis not present

## 2022-01-02 MED ORDER — MECLIZINE HCL 25 MG PO TABS: 25.0000 mg | ORAL_TABLET | Freq: Three times a day (TID) | ORAL | 0 refills | Status: DC | PRN

## 2022-01-02 MED ORDER — OMEPRAZOLE 20 MG PO CPDR: 20.0000 mg | DELAYED_RELEASE_CAPSULE | Freq: Every day | ORAL | 0 refills | Status: DC

## 2022-01-02 MED ORDER — NEOMYCIN-POLYMYXIN-HC 3.5-10000-1 OT SOLN: 3.0000 [drp] | Freq: Four times a day (QID) | OTIC | 0 refills | Status: AC

## 2022-01-02 NOTE — Patient Instructions (Addendum)
A few things to remember from today's visit: ? ?Benign paroxysmal positional vertigo of left ear - Plan: Ambulatory referral to Physical Therapy, meclizine (ANTIVERT) 25 MG tablet ? ?Acute otitis externa of left ear, unspecified type - Plan: neomycin-polymyxin-hydrocortisone (CORTISPORIN) OTIC solution ? ?Headache, unspecified headache type ? ?Dysphagia, unspecified type ? ?Do not use My Chart to request refills or for acute issues that need immediate attention. ?  ?Difficulty swallowing could be GERD, so try Omeprazole before breakfast daily for 4 weeks. ?I do not think headache is associated with vertigo, monitor for changes. Follow with PCP. Seek immediate medical attention if sudden worsening and new associated symptos. ? ?Please be sure medication list is accurate. ?If a new problem present, please set up appointment sooner than planned today. ? ?Benign Positional Vertigo ?Vertigo is the feeling that you or your surroundings are moving when they are not. Benign positional vertigo is the most common form of vertigo. This is usually a harmless condition (benign). This condition is positional. This means that symptoms are triggered by certain movements and positions. ?This condition can be dangerous if it occurs while you are doing something that could cause harm to yourself or others. This includes activities such as driving or operating machinery. ?What are the causes? ?The inner ear has fluid-filled canals that help your brain sense movement and balance. When the fluid moves, the brain receives messages about your body's position. ?With benign positional vertigo, calcium crystals in the inner ear break free and disturb the inner ear area. This causes your brain to receive confusing messages about your body's position. ?What increases the risk? ?You are more likely to develop this condition if: ?You are a woman. ?You are 77 years of age or older. ?You have recently had a head injury. ?You have an inner ear  disease. ?What are the signs or symptoms? ?Symptoms of this condition usually happen when you move your head or your eyes in different directions. Symptoms may start suddenly and usually last for less than a minute. They include: ?Loss of balance and falling. ?Feeling like you are spinning or moving. ?Feeling like your surroundings are spinning or moving. ?Nausea and vomiting. ?Blurred vision. ?Dizziness. ?Involuntary eye movement (nystagmus). ?Symptoms can be mild and cause only minor problems, or they can be severe and interfere with daily life. Episodes of benign positional vertigo may return (recur) over time. Symptoms may also improve over time. ?How is this diagnosed? ?This condition may be diagnosed based on: ?Your medical history. ?A physical exam of the head, neck, and ears. ?Positional tests to check for or stimulate vertigo. You may be asked to turn your head and change positions, such as going from sitting to lying down. A health care provider will watch for symptoms of vertigo. ?You may be referred to a health care provider who specializes in ear, nose, and throat problems (ENT or otolaryngologist) or a provider who specializes in disorders of the nervous system (neurologist). ?How is this treated? ?This condition may be treated in a session in which your health care provider moves your head in specific positions to help the displaced crystals in your inner ear move. Treatment for this condition may take several sessions. Surgery may be needed in severe cases, but this is rare. ?In some cases, benign positional vertigo may resolve on its own in 2-4 weeks. ?Follow these instructions at home: ?Safety ?Move slowly. Avoid sudden body or head movements or certain positions, as told by your health care provider. ?Avoid driving or  operating machinery until your health care provider says it is safe. ?Avoid doing any tasks that would be dangerous to you or others if vertigo occurs. ?If you have trouble walking  or keeping your balance, try using a cane for stability. If you feel dizzy or unstable, sit down right away. ?Return to your normal activities as told by your health care provider. Ask your health care provider what activities are safe for you. ?General instructions ?Take over-the-counter and prescription medicines only as told by your health care provider. ?Drink enough fluid to keep your urine pale yellow. ?Keep all follow-up visits. This is important. ?Contact a health care provider if: ?You have a fever. ?Your condition gets worse or you develop new symptoms. ?Your family or friends notice any behavioral changes. ?You have nausea or vomiting that gets worse. ?You have numbness or a prickling and tingling sensation. ?Get help right away if you: ?Have difficulty speaking or moving. ?Are always dizzy or faint. ?Develop severe headaches. ?Have weakness in your legs or arms. ?Have changes in your hearing or vision. ?Develop a stiff neck. ?Develop sensitivity to light. ?These symptoms may represent a serious problem that is an emergency. Do not wait to see if the symptoms will go away. Get medical help right away. Call your local emergency services (911 in the [place] Do not drive yourself to the hospital. ?Summary ?Vertigo is the feeling that you or your surroundings are moving when they are not. Benign positional vertigo is the most common form of vertigo. ?This condition is caused by calcium crystals in the inner ear that become displaced. This causes a disturbance in an area of the inner ear that helps your brain sense movement and balance. ?Symptoms include loss of balance and falling, feeling that you or your surroundings are moving, nausea and vomiting, and blurred vision. ?This condition can be diagnosed based on symptoms, a physical exam, and positional tests. ?Follow safety instructions as told by your health care provider and keep all follow-up visits. This is important. ?This information is not intended to  replace advice given to you by your health care provider. Make sure you discuss any questions you have with your health care provider. ?Document Revised: 08/17/2020 Document Reviewed: 08/17/2020 ?Elsevier Patient Education ? 2022 [place]. ? ? ? ? ? ? ?

## 2022-01-02 NOTE — Progress Notes (Signed)
? ? ?ACUTE VISIT ?Chief Complaint  ?Patient presents with  ? Dizziness  ?  Started last week, does have a history of vertigo. Falling a lot, so having to walk slower. Having left ear pain which may be triggering vertigo.   ? trouble swallowing  ?  Goiter present since December, no cancer cells. Can feel when she is turning & making it difficult to swallow. Started over the weekend.  ? ?HPI: ?Stacey Key is a 36 y.o. female, who is here today complaining of intermittent episodes of dizziness as described above. ?She has history of vertigo. ?Describes episodes spinning sensation, exacerbated by movement, turning head. ?Episodes are alleviated after being still for a few seconds. ? ?Today during lunch she had an episode associated with blurry vision, "slight", that lasted seconds. ? ?Dizziness ?This is a recurrent problem. The current episode started in the past 7 days. The problem has been waxing and waning. Associated symptoms include coughing (Slight.), headaches, nausea and vertigo. Pertinent negatives include no abdominal pain, anorexia, arthralgias, change in bowel habit, chest pain, chills, congestion, fatigue, fever, joint swelling, myalgias, neck pain, numbness, rash, sore throat, swollen glands, urinary symptoms, vomiting or weakness. The symptoms are aggravated by bending. She has tried nothing for the symptoms.  ?No recent travel. ?About 2 weeks ago she had some cold like symptoms, thought to be allergies. ?Bilateral tinnitus, intermittent for years. ? ?Left earache yesterday, she has seen yellowish drainage , seems liquid cerumen.+ Pruritus. Negative for changes hearing changes. ? ?Frontal headache , R>L, almost daily for the past 3 weeks. ?She has not identified exacerbating or alleviating factors. ?No associated focal neurologic deficit, photophobia, nausea, or vomiting. ?Reporting history of migraines. ? ?She is also complaining about difficulty swallowing solids for the past couple days. ?She  has history of goiter. ?Negative for wheezing or stridor. ?She does not have difficulty swallowing liquids. ?Negative for heartburn or burping. ?She has not tried OTC medications. ?She had similar symptoms in 10/2021, "little bit." ? ?Thyroid US 08/2021:  ?1. Enlarged, multinodular thyroid gland. ?2. 5.2 cm dominant, cystic LEFT thyroid nodule (TR-1). This nodule does NOT meet TI-RADS criteria for biopsy or dedicated follow-up. ? ?Lab Results  ?Component Value Date  ? WBC 5.1 12/17/2021  ? HGB 13.0 12/17/2021  ? HCT 39.7 12/17/2021  ? MCV 89.2 12/17/2021  ? PLT 301 12/17/2021  ? ?Review of Systems  ?Constitutional:  Negative for chills, fatigue and fever.  ?HENT:  Negative for congestion, mouth sores and sore throat.   ?Respiratory:  Positive for cough (Slight.).   ?Cardiovascular:  Negative for chest pain, palpitations and leg swelling.  ?Gastrointestinal:  Positive for nausea. Negative for abdominal pain, anorexia, change in bowel habit and vomiting.  ?Musculoskeletal:  Negative for arthralgias, joint swelling, myalgias and neck pain.  ?Skin:  Negative for rash.  ?Neurological:  Positive for dizziness, vertigo and headaches. Negative for syncope, weakness and numbness.  ?Hematological:  Negative for adenopathy. Does not bruise/bleed easily.  ?Rest see pertinent positives and negatives per HPI. ? ?Current Outpatient Medications on File Prior to Visit  ?Medication Sig Dispense Refill  ? albuterol (PROVENTIL) (2.5 MG/3ML) 0.083% nebulizer solution Take 3 mLs (2.5 mg total) by nebulization every 6 (six) hours as needed for wheezing or shortness of breath. 150 mL 1  ? albuterol (VENTOLIN HFA) 108 (90 Base) MCG/ACT inhaler Inhale 2 puffs into the lungs every 6 (six) hours as needed for wheezing or shortness of breath. 8 g 2  ? cetirizine (ZYRTEC)  10 MG tablet Take 1 tablet (10 mg total) by mouth daily. 30 tablet 11  ? clindamycin (CLINDAGEL) 1 % gel Apply topically 2 (two) times daily. 30 g 0  ? doxycycline (VIBRAMYCIN)  100 MG capsule Take 1 capsule (100 mg total) by mouth 2 (two) times daily. 20 capsule 0  ? levonorgestrel (MIRENA) 20 MCG/24HR IUD 1 each by Intrauterine route once.    ? ?No current facility-administered medications on file prior to visit.  ? ?Past Medical History:  ?Diagnosis Date  ? Allergy   ? Anemia   ? Arthritis   ? shoulders, knee, hand  ? Asthma   ? Endometriosis   ? GERD (gastroesophageal reflux disease)   ? Ovarian cyst   ? Vitamin D deficiency   ? ?No Known Allergies ? ?Social History  ? ?Socioeconomic History  ? Marital status: Single  ?  Spouse name: Not on file  ? Number of children: Not on file  ? Years of education: Not on file  ? Highest education level: Not on file  ?Occupational History  ? Not on file  ?Tobacco Use  ? Smoking status: Never  ? Smokeless tobacco: Never  ?Substance and Sexual Activity  ? Alcohol use: No  ? Drug use: No  ? Sexual activity: Never  ?  Birth control/protection: Injection  ?Other Topics Concern  ? Not on file  ?Social History Narrative  ? She currently owns her own business.   ? 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   ? ?Social Determinants of Health  ? ?Financial Resource Strain: Not on file  ?Food Insecurity: Not on file  ?Transportation Needs: Not on file  ?Physical Activity: Not on file  ?Stress: Not on file  ?Social Connections: Not on file  ? ?Vitals:  ? 01/02/22 1540  ?BP: 128/80  ?Pulse: 78  ?Resp: 12  ?SpO2: 97%  ? ?Body mass index is 28.5 kg/m?. ? ?Physical Exam ?Vitals and nursing note reviewed.  ?Constitutional:   ?   General: She is not in acute distress. ?   Appearance: She is well-developed. She is not ill-appearing.  ?HENT:  ?   Head: Normocephalic and atraumatic.  ?   Right Ear: Hearing, tympanic membrane, ear canal and external ear normal.  ?   Left Ear: Hearing, tympanic membrane and external ear normal. Tenderness (small area of tenderness and erythema + scaly upper proximal ear canal) present.  ?   Mouth/Throat:  ?   Mouth: Mucous membranes are moist.  ?    Pharynx: Oropharynx is clear.  ?Eyes:  ?   Extraocular Movements: Extraocular movements intact.  ?   Conjunctiva/sclera: Conjunctivae normal.  ?   Pupils: Pupils are equal, round, and reactive to light.  ?Neck:  ?   Thyroid: Thyromegaly present. No thyroid tenderness.  ?   Comments: Apley maneuver and Dix-Hallpike maneuver positive left, associated nystagmus. ?Cardiovascular:  ?   Rate and Rhythm: Normal rate and regular rhythm.  ?   Heart sounds: No murmur heard. ?Pulmonary:  ?   Effort: Pulmonary effort is normal. No respiratory distress.  ?   Breath sounds: Normal breath sounds.  ?Abdominal:  ?   Palpations: Abdomen is soft.  ?   Tenderness: There is no abdominal tenderness.  ?Musculoskeletal:  ?   Right lower leg: No edema.  ?   Left lower leg: No edema.  ?Lymphadenopathy:  ?   Cervical: No cervical adenopathy.  ?Skin: ?   General: Skin is warm.  ?  Findings: No erythema or rash.  ?Neurological:  ?   General: No focal deficit present.  ?   Mental Status: She is alert and oriented to person, place, and time.  ?   Cranial Nerves: No cranial nerve deficit.  ?   Sensory: No sensory deficit.  ?   Coordination: Coordination normal.  ?   Gait: Gait normal.  ?   Deep Tendon Reflexes:  ?   Reflex Scores: ?     Bicep reflexes are 2+ on the right side and 2+ on the left side. ?     Patellar reflexes are 2+ on the right side and 2+ on the left side. ?Psychiatric:     ?   Mood and Affect: Mood and affect normal.  ?   Comments: Well groomed, good eye contact.  ? ?ASSESSMENT AND PLAN: ? ?Ms.Jaskiran was seen today for dizziness and trouble swallowing. ? ?Diagnoses and all orders for this visit: ?Orders Placed This Encounter  ?Procedures  ? Ambulatory referral to Physical Therapy  ? ?Benign paroxysmal positional vertigo of left ear ?We discussed other possible etiologies of dizziness, Hx and examination today suggest benign vertigo. ?I do not think further work-up is necessary at this time but needs to be consider if worsening  or persient symptoms for more that 2 weeks; in this case ENT will be considered. ?Explained that problem can be recurrent. ?Fall prevention. ?Vestibular exercises recommended,she would like PT vesti

## 2022-02-22 ENCOUNTER — Encounter: Payer: Self-pay | Admitting: Nurse Practitioner

## 2022-02-22 ENCOUNTER — Ambulatory Visit (INDEPENDENT_AMBULATORY_CARE_PROVIDER_SITE_OTHER): Payer: 59 | Admitting: Nurse Practitioner

## 2022-02-22 VITALS — BP 112/80 | HR 92 | Temp 98.3°F | Ht 65.0 in | Wt 170.4 lb

## 2022-02-22 DIAGNOSIS — Z0000 Encounter for general adult medical examination without abnormal findings: Secondary | ICD-10-CM

## 2022-02-22 DIAGNOSIS — Z1159 Encounter for screening for other viral diseases: Secondary | ICD-10-CM

## 2022-02-22 DIAGNOSIS — Z8669 Personal history of other diseases of the nervous system and sense organs: Secondary | ICD-10-CM

## 2022-02-22 DIAGNOSIS — E559 Vitamin D deficiency, unspecified: Secondary | ICD-10-CM

## 2022-02-22 DIAGNOSIS — E049 Nontoxic goiter, unspecified: Secondary | ICD-10-CM

## 2022-02-22 DIAGNOSIS — R2232 Localized swelling, mass and lump, left upper limb: Secondary | ICD-10-CM

## 2022-02-22 DIAGNOSIS — Z13228 Encounter for screening for other metabolic disorders: Secondary | ICD-10-CM

## 2022-02-22 DIAGNOSIS — H6690 Otitis media, unspecified, unspecified ear: Secondary | ICD-10-CM | POA: Diagnosis not present

## 2022-02-22 DIAGNOSIS — Z6828 Body mass index (BMI) 28.0-28.9, adult: Secondary | ICD-10-CM

## 2022-02-22 MED ORDER — AMOXICILLIN 875 MG PO TABS: 875.0000 mg | ORAL_TABLET | Freq: Two times a day (BID) | ORAL | 0 refills | Status: DC

## 2022-02-22 NOTE — Patient Instructions (Signed)

## 2022-02-22 NOTE — Progress Notes (Signed)
I,Stacey Key,acting as a Education administrator for [employer], FNP.,have documented all relevant documentation on the behalf of Stacey Brine, FNP,as directed by  Stacey Brine, FNP while in the presence of Stacey Key, [place].  This visit occurred during the SARS-CoV-2 public health emergency.  Safety protocols were in place, including screening questions prior to the visit, additional usage of staff PPE, and extensive cleaning of exam room while observing appropriate contact time as indicated for disinfecting solutions.  Subjective:     Patient ID: Stacey Key , female    DOB: [birth year] , 36 y.o.   MRN: [id number]   Chief Complaint  Patient presents with   Annual Exam    HPI  Patient presents today for physical exam. Patient goes to [facility] for her gyn care. Patient is concerned about her thyroid goiter.   Wt Readings from Last 3 Encounters: 02/22/22 : 170 lb 6.4 oz (77.3 kg) 01/02/22 : 171 lb 4 oz (77.7 kg) 06/27/21 : 174 lb (78.9 kg)      Past Medical History:  Diagnosis Date   Allergy    Anemia    Arthritis    shoulders, knee, hand   Asthma    Endometriosis    GERD (gastroesophageal reflux disease)    Ovarian cyst    Vitamin D deficiency      Family History  Problem Relation Age of Onset   Anemia Mother    GER disease Mother    Hypertension Father    Hypertension Maternal Grandmother    Breast cancer Maternal Grandmother 10       over 36   Rheum arthritis Maternal Grandmother    Hypertension Maternal Grandfather    Clotting disorder Maternal Grandfather    Skin cancer Maternal Grandfather    Breast cancer Paternal Grandmother    Hypertension Paternal Grandfather    Lung cancer Paternal Grandfather    Asthma Maternal Aunt    Other Neg Hx      Current Outpatient Medications:    albuterol (PROVENTIL) (2.5 MG/3ML) 0.083% nebulizer solution, Take 3 mLs (2.5 mg total) by nebulization every 6 (six) hours as needed for wheezing or shortness of breath., Disp: 150 mL, Rfl:  1   albuterol (VENTOLIN HFA) 108 (90 Base) MCG/ACT inhaler, Inhale 2 puffs into the lungs every 6 (six) hours as needed for wheezing or shortness of breath., Disp: 8 g, Rfl: 2   amoxicillin (AMOXIL) 875 MG tablet, Take 1 tablet (875 mg total) by mouth 2 (two) times daily., Disp: 14 tablet, Rfl: 0   cetirizine (ZYRTEC) 10 MG tablet, Take 1 tablet (10 mg total) by mouth daily., Disp: 30 tablet, Rfl: 11   levonorgestrel (MIRENA) 20 MCG/24HR IUD, 1 each by Intrauterine route once., Disp: , Rfl:    meclizine (ANTIVERT) 25 MG tablet, Take 1 tablet (25 mg total) by mouth 3 (three) times daily as needed for dizziness. (Patient not taking: Reported on 02/22/2022), Disp: 30 tablet, Rfl: 0   omeprazole (PRILOSEC) 20 MG capsule, Take 1 capsule (20 mg total) by mouth daily. (Patient not taking: Reported on 02/22/2022), Disp: 30 capsule, Rfl: 0   Vitamin D, Ergocalciferol, (DRISDOL) 1.25 MG (50000 UNIT) CAPS capsule, Take 1 capsule (50,000 Units total) by mouth every 7 (seven) days., Disp: 12 capsule, Rfl: 1   No Known Allergies    The patient states she uses IUD for birth control. She continues to have menstrual cycles and recently had a new one placed.  No LMP recorded. (Menstrual status: IUD).. Negative for  Dysmenorrhea and Negative for Menorrhagia. Negative for: breast discharge, breast lump(s), breast pain and breast self exam. Associated symptoms include abnormal vaginal bleeding. Pertinent negatives include abnormal bleeding (hematology), anxiety, decreased libido, depression, difficulty falling sleep, dyspareunia, history of infertility, nocturia, sexual dysfunction, sleep disturbances, urinary incontinence, urinary urgency, vaginal discharge and vaginal itching. Diet regular - she is trying to change to an antiinflammatory diet. She does have a history of endometriosis. She is not eating red meat. The patient states her exercise level is minimal due to preparing for her wedding on August 19th.     The  patient's tobacco use is:  Social History   Tobacco Use  Smoking Status Never  Smokeless Tobacco Never   She has been exposed to passive smoke. The patient's alcohol use is:  Social History   Substance and Sexual Activity  Alcohol Use No      Review of Systems  Constitutional: Negative.   HENT: Negative.    Eyes: Negative.   Respiratory: Negative.    Cardiovascular: Negative.   Gastrointestinal: Negative.   Endocrine: Negative.   Genitourinary: Negative.   Musculoskeletal: Negative.   Skin: Negative.   Allergic/Immunologic: Negative.   Neurological: Negative.   Hematological: Negative.   Psychiatric/Behavioral: Negative.      Today's Vitals   02/22/22 1022  BP: 112/80  Pulse: 92  Temp: 98.3 F (36.8 C)  Weight: 170 lb 6.4 oz (77.3 kg)  Height: '5\' 5"'  (1.651 m)  PainSc: 0-No pain   Body mass index is 28.36 kg/m.   Objective:  Physical Exam Vitals reviewed.  Constitutional:      General: She is not in acute distress.    Appearance: Normal appearance. She is well-developed. She is obese.  HENT:     Head: Normocephalic and atraumatic.     Right Ear: Hearing, tympanic membrane, ear canal and external ear normal. There is no impacted cerumen.     Left Ear: Hearing, tympanic membrane and external ear normal. There is no impacted cerumen.     Ears:     Comments: Erythematous canal    Nose:     Comments: Deferred - masked    Mouth/Throat:     Comments: Deferred - masked Eyes:     General: Lids are normal.     Extraocular Movements: Extraocular movements intact.     Conjunctiva/sclera: Conjunctivae normal.     Pupils: Pupils are equal, round, and reactive to light.     Funduscopic exam:    Right eye: No papilledema.        Left eye: No papilledema.  Neck:     Thyroid: No thyroid mass.     Vascular: No carotid bruit.  Cardiovascular:     Rate and Rhythm: Normal rate and regular rhythm.     Pulses: Normal pulses.     Heart sounds: Normal heart sounds. No  murmur heard. Pulmonary:     Effort: Pulmonary effort is normal. No respiratory distress.     Breath sounds: Normal breath sounds. No wheezing.  Chest:     Chest wall: No mass.  Breasts:    Tanner Score is 5.     Right: Normal. No mass or tenderness.     Left: Normal. No mass or tenderness.  Abdominal:     General: Abdomen is flat. Bowel sounds are normal. There is no distension.     Palpations: Abdomen is soft.     Tenderness: There is no abdominal tenderness.  Genitourinary:    Rectum: Guaiac  result negative.  Musculoskeletal:        General: No swelling or tenderness. Normal range of motion.     Cervical back: Full passive range of motion without pain, normal range of motion and neck supple.     Right lower leg: No edema.     Left lower leg: No edema.  Lymphadenopathy:     Upper Body:     Right upper body: No supraclavicular, axillary or pectoral adenopathy.     Left upper body: No supraclavicular, axillary or pectoral adenopathy.  Skin:    General: Skin is warm and dry.     Capillary Refill: Capillary refill takes less than 2 seconds.     Comments: Left axilla palpable mass  Neurological:     General: No focal deficit present.     Mental Status: She is alert and oriented to person, place, and time.     Cranial Nerves: No cranial nerve deficit.     Sensory: No sensory deficit.     Motor: No weakness.  Psychiatric:        Mood and Affect: Mood normal.        Behavior: Behavior normal.        Thought Content: Thought content normal.        Judgment: Judgment normal.        Assessment And Plan:     1. Encounter for annual physical exam Behavior modifications discussed and diet history reviewed.   Pt will continue to exercise regularly and modify diet with low GI, plant based foods and decrease intake of processed foods.  Recommend intake of daily multivitamin, Vitamin D, and calcium.  Recommend for preventive screenings, as well as recommend immunizations that include  influenza, TDAP - CBC - CMP14+EGFR  2. Mass of left axilla Comments: Palpable mass to left axilla, will check ultrasound - [place] AXILLA LEFT; Future  3. Vitamin D deficiency Will check vitamin D level and supplement as needed.    Also encouraged to spend 15 minutes in the sun daily.  - VITAMIN D 25 Hydroxy (Vit-D Deficiency, Fractures)  4. Thyroid goiter - TSH - T4 - T3, free  5. Acute otitis media, unspecified otitis media type Comments: erythema present to ear canal will treat with antibiotic - amoxicillin (AMOXIL) 875 MG tablet; Take 1 tablet (875 mg total) by mouth 2 (two) times daily.  Dispense: 14 tablet; Refill: 0  6. BMI 28.0-28.9,adult  7. Encounter for hepatitis C screening test for low risk patient Will check Hepatitis C screening due to recent recommendations to screen all adults 18 years and older - Hepatitis C antibody  8. Encounter for screening for metabolic disorder - Hemoglobin A1c - Lipid panel  9. History of frequent ear infections - Ambulatory referral to ENT    Patient was given opportunity to ask questions. Patient verbalized understanding of the plan and was able to repeat key elements of the plan. All questions were answered to their satisfaction.   Stacey Brine, FNP   I, Stacey Brine, FNP, have reviewed all documentation for this visit. The documentation on 02/22/22 for the exam, diagnosis, procedures, and orders are all accurate and complete.   THE PATIENT IS ENCOURAGED TO PRACTICE SOCIAL DISTANCING DUE TO THE COVID-19 PANDEMIC.

## 2022-02-23 ENCOUNTER — Other Ambulatory Visit: Payer: Self-pay | Admitting: Nurse Practitioner

## 2022-02-23 LAB — CBC
Hematocrit: 40.9 % (ref 34.0–46.6)
Hemoglobin: 13 g/dL (ref 11.1–15.9)
MCH: 27.7 pg (ref 26.6–33.0)
MCHC: 31.8 g/dL (ref 31.5–35.7)
MCV: 87 fL (ref 79–97)
Platelets: 315 10*3/uL (ref 150–450)
RBC: 4.7 x10E6/uL (ref 3.77–5.28)
RDW: 11.3 % — ABNORMAL LOW (ref 11.7–15.4)
WBC: 5.3 10*3/uL (ref 3.4–10.8)

## 2022-02-23 LAB — CMP14+EGFR
ALT: 9 IU/L (ref 0–32)
AST: 15 IU/L (ref 0–40)
Albumin/Globulin Ratio: 1.5 (ref 1.2–2.2)
Albumin: 4.7 g/dL (ref 3.8–4.8)
Alkaline Phosphatase: 58 IU/L (ref 44–121)
BUN/Creatinine Ratio: 7 — ABNORMAL LOW (ref 9–23)
BUN: 5 mg/dL — ABNORMAL LOW (ref 6–20)
Bilirubin Total: 0.3 mg/dL (ref 0.0–1.2)
CO2: 25 mmol/L (ref 20–29)
Calcium: 9.5 mg/dL (ref 8.7–10.2)
Chloride: 103 mmol/L (ref 96–106)
Creatinine, Ser: 0.76 mg/dL (ref 0.57–1.00)
Globulin, Total: 3.2 g/dL (ref 1.5–4.5)
Glucose: 86 mg/dL (ref 70–99)
Potassium: 4.6 mmol/L (ref 3.5–5.2)
Sodium: 142 mmol/L (ref 134–144)
Total Protein: 7.9 g/dL (ref 6.0–8.5)
eGFR: 106 mL/min/{1.73_m2} (ref >59)

## 2022-02-23 LAB — LIPID PANEL
Chol/HDL Ratio: 2.5 ratio (ref 0.0–4.4)
Cholesterol, Total: 132 mg/dL (ref 100–199)
HDL: 53 mg/dL (ref >39)
LDL Chol Calc (NIH): 68 mg/dL (ref 0–99)
Triglycerides: 46 mg/dL (ref 0–149)
VLDL Cholesterol Cal: 11 mg/dL (ref 5–40)

## 2022-02-23 LAB — HEPATITIS C ANTIBODY: Hep C Virus Ab: NONREACTIVE

## 2022-02-23 LAB — T3, FREE: T3, Free: 3.6 pg/mL (ref 2.0–4.4)

## 2022-02-23 LAB — T4: T4, Total: 7.5 ug/dL (ref 4.5–12.0)

## 2022-02-23 LAB — HEMOGLOBIN A1C
Est. average glucose Bld gHb Est-mCnc: 111 mg/dL
Hgb A1c MFr Bld: 5.5 % (ref 4.8–5.6)

## 2022-02-23 LAB — TSH: TSH: 1.79 u[IU]/mL (ref 0.450–4.500)

## 2022-02-23 LAB — VITAMIN D 25 HYDROXY (VIT D DEFICIENCY, FRACTURES): Vit D, 25-Hydroxy: 20.2 ng/mL — ABNORMAL LOW (ref 30.0–100.0)

## 2022-02-23 MED ORDER — VITAMIN D (ERGOCALCIFEROL) 1.25 MG (50000 UNIT) PO CAPS: 50000.0000 [IU] | ORAL_CAPSULE | ORAL | 1 refills | Status: DC

## 2022-03-14 ENCOUNTER — Other Ambulatory Visit: Payer: Self-pay | Admitting: Nurse Practitioner

## 2022-03-14 DIAGNOSIS — R2232 Localized swelling, mass and lump, left upper limb: Secondary | ICD-10-CM

## 2022-03-28 ENCOUNTER — Ambulatory Visit
Admission: RE | Admit: 2022-03-28 | Discharge: 2022-03-28 | Disposition: A | Discharge status: Home / Self Care | Payer: 59 | Source: Ambulatory Visit | Attending: Nurse Practitioner | Admitting: Nurse Practitioner

## 2022-03-28 ENCOUNTER — Other Ambulatory Visit: Payer: Self-pay | Admitting: Nurse Practitioner

## 2022-03-28 DIAGNOSIS — R2232 Localized swelling, mass and lump, left upper limb: Secondary | ICD-10-CM

## 2022-05-15 ENCOUNTER — Ambulatory Visit: Payer: 59 | Admitting: Nurse Practitioner

## 2022-06-22 ENCOUNTER — Encounter: Payer: Self-pay | Admitting: Nurse Practitioner

## 2022-06-26 ENCOUNTER — Ambulatory Visit: Payer: 59 | Admitting: Adult Health

## 2022-06-26 ENCOUNTER — Ambulatory Visit (INDEPENDENT_AMBULATORY_CARE_PROVIDER_SITE_OTHER): Payer: 59 | Admitting: Family Medicine

## 2022-06-26 ENCOUNTER — Encounter: Payer: Self-pay | Admitting: Family Medicine

## 2022-06-26 VITALS — BP 112/92 | HR 88

## 2022-06-26 DIAGNOSIS — J302 Other seasonal allergic rhinitis: Secondary | ICD-10-CM | POA: Diagnosis not present

## 2022-06-26 DIAGNOSIS — H9202 Otalgia, left ear: Secondary | ICD-10-CM | POA: Diagnosis not present

## 2022-06-26 DIAGNOSIS — R42 Dizziness and giddiness: Secondary | ICD-10-CM

## 2022-06-26 MED ORDER — MECLIZINE HCL 25 MG PO TABS: 25.0000 mg | ORAL_TABLET | Freq: Three times a day (TID) | ORAL | 0 refills | Status: DC | PRN

## 2022-06-26 MED ORDER — ONDANSETRON 4 MG PO TBDP: 4.0000 mg | ORAL_TABLET | Freq: Three times a day (TID) | ORAL | 0 refills | Status: DC | PRN

## 2022-06-26 NOTE — Patient Instructions (Signed)
You can use local honey, saline nasal spray for your allergies.  The nasal spray can be found over-the-counter at your local drugstore.  A prescription for meclizine medication for dizziness was sent to your pharmacy along with a prescription for Zofran 4 mg, a prescription for nausea.

## 2022-06-26 NOTE — Progress Notes (Signed)
Subjective:    Patient ID: Stacey Key, female    DOB: [birth year], 36 y.o.   MRN: [id number]  Chief Complaint  Patient presents with   Dizziness    Left ear pain, head is throbbing, really dizzy. Woke up dizzy, and then ears started throbbing.      HPI Patient is a 36 yo female with pmh sig for arthritis, asthma, endometriosis, vit d def, anemia, vertigo, recurrent AOM, and seasonal allergies who was seen today for acute concern.  Pt woke up with dizziness, L ear pain, and pounding HA.  Symptoms became worse at work with looking at computer screen.  Pt concerned she has an ear infection making symptoms worse.  Has appt with ENT next wk.  Pt feels like the room is spinning and she is unstable.  Typically drinks tea for HA, occasionally take OTC med for HA like goody's powder.  Endorses h/o seasonal allergies since childhood.  Taking Zyrtec prn.  Has never tried any other antihistamine.  Pt does not like taking pills/has an aversion.  Tries natural remedies first, then medication.    Past Medical History:  Diagnosis Date   Allergy    Anemia    Arthritis    shoulders, knee, hand   Asthma    Endometriosis    GERD (gastroesophageal reflux disease)    Ovarian cyst    Vitamin D deficiency     No Known Allergies  ROS General: Denies fever, chills, night sweats, changes in weight, changes in appetite +dizziness HEENT: Denies headaches, ear pain, changes in vision, rhinorrhea, sore throat +HA, L ear pain/increased pressure. CV: Denies CP, palpitations, SOB, orthopnea Pulm: Denies SOB, cough, wheezing GI: Denies abdominal pain, nausea, vomiting, diarrhea, constipation +nausea, increased stools GU: Denies dysuria, hematuria, frequency, vaginal discharge Msk: Denies muscle cramps, joint pains Neuro: Denies weakness, numbness, tingling Skin: Denies rashes, bruising Psych: Denies depression, anxiety, hallucinations    Objective:    Blood pressure (!) 112/92, pulse 88, SpO2 99 %.  Gen.  Pleasant, well-nourished, appears uncomfortable, lying on exam table in the dark, normal affect   HEENT: [place]/AT, face symmetric, conjunctiva clear, no scleral icterus, PERRLA, EOMI, 3 beats nystagmus with R gaze. Epley maneuver performed.  Nares patent without drainage.  B/l TMs normal.  Lungs: no accessory muscle use Cardiovascular: RRR,  no peripheral edema Neuro:  A&Ox3, CN II-XII intact, ambulating with slow intentional gait, looking down at floor Skin:  Warm, no lesions/ rash   Wt Readings from Last 3 Encounters:  02/22/22 170 lb 6.4 oz (77.3 kg)  01/02/22 171 lb 4 oz (77.7 kg)  06/27/21 174 lb (78.9 kg)    Lab Results  Component Value Date   WBC 5.3 02/22/2022   HGB 13.0 02/22/2022   HCT 40.9 02/22/2022   PLT 315 02/22/2022   GLUCOSE 86 02/22/2022   CHOL 132 02/22/2022   TRIG 46 02/22/2022   HDL 53 02/22/2022   LDLCALC 68 02/22/2022   ALT 9 02/22/2022   AST 15 02/22/2022   NA 142 02/22/2022   K 4.6 02/22/2022   CL 103 02/22/2022   CREATININE 0.76 02/22/2022   BUN 5 (L) 02/22/2022   CO2 25 02/22/2022   TSH 1.790 02/22/2022   HGBA1C 5.5 02/22/2022    Assessment/Plan:  Vertigo -symptoms similar to pt's typical vertigo symptoms -Epley maneuver performed in clinic with mild improvement in symptoms.  Can perform at home -Change in weather likely attributing to symptoms.  No current signs of AOM on exam. -  Meclizine as needed for dizziness, Zofran as needed for nausea. -Discussed OTC natural eardrop which may help with vertigo symptoms. -Encouraged to keep ENT appointment -Given handout. -Given strict precautions  - Plan: meclizine (ANTIVERT) 25 MG tablet, ondansetron (ZOFRAN-ODT) 4 MG disintegrating tablet  Left ear pain -Possibly 2/2 increased pressure from seasonal allergies -Continue OTC allergy medication, saline nasal rinse, local honey, etc.  Seasonal allergies -Consider switching allergy medication from Zyrtec to another OTC antihistamine for relief of  symptoms -Also consider saline nasal rinse -Continue using local honey -For continued worsening symptoms consider Singulair.  F/u prn  Grier Mitts, MD

## 2022-08-07 ENCOUNTER — Ambulatory Visit: Payer: 59 | Admitting: Nurse Practitioner

## 2022-08-07 ENCOUNTER — Encounter: Payer: Self-pay | Admitting: Nurse Practitioner

## 2022-08-07 VITALS — BP 108/78 | HR 95 | Temp 98.1°F | Ht 65.0 in | Wt 180.2 lb

## 2022-08-07 DIAGNOSIS — Z8742 Personal history of other diseases of the female genital tract: Secondary | ICD-10-CM | POA: Diagnosis not present

## 2022-08-07 DIAGNOSIS — R102 Pelvic and perineal pain: Secondary | ICD-10-CM

## 2022-08-07 DIAGNOSIS — Z3A01 Less than 8 weeks gestation of pregnancy: Secondary | ICD-10-CM

## 2022-08-07 LAB — POCT URINE PREGNANCY: Preg Test, Ur: POSITIVE — AB

## 2022-08-07 NOTE — Progress Notes (Signed)
I,Victoria T Hamilton,acting as a Education administrator for [name], FNP.,have documented all relevant documentation on the behalf of [name], FNP,as directed by  [name], FNP while in the presence of [name], [place].    Subjective:     Patient ID: Stacey Key , female    DOB: [birth year] , 36 y.o.   MRN: [id number]   No chief complaint on file.   HPI  Patient presents today with concerns of sharp pelvic pain. Patient does have endometriosis, which she states gives her anxiety going forward with this pregnancy. She is having sharp pain to her left side even with her endometriosis. LMP - October 15th. She is currently trying to getting pregnant. She is tracking her cycles and the app said she is 2 days late.   Patient is est with gyn Dr Alesia Richards [facility]. She wanted to wait to make f/u apt with them after visiting here. She has not given them a call.   She has taken 2 at home pregnancy test both were positive, second line she noticed looked a little faint still two lines.   Her cycle is supposed to start Sunday. She was gagging when she smelled "weed" this morning. Her sense of smell is heightened. She has breast tenderness for the last 4-5 days. Denies vaginal bleeding.       Past Medical History:  Diagnosis Date   Allergy    Anemia    Arthritis    shoulders, knee, hand   Asthma    Endometriosis    GERD (gastroesophageal reflux disease)    Ovarian cyst    Vitamin D deficiency      Family History  Problem Relation Age of Onset   Anemia Mother    GER disease Mother    Hypertension Father    Hypertension Maternal Grandmother    Breast cancer Maternal Grandmother 6       over 27   Rheum arthritis Maternal Grandmother    Hypertension Maternal Grandfather    Clotting disorder Maternal Grandfather    Skin cancer Maternal Grandfather    Breast cancer Paternal Grandmother    Hypertension Paternal Grandfather    Lung cancer Paternal Grandfather    Asthma Maternal Aunt     Other Neg Hx      Current Outpatient Medications:    albuterol (PROVENTIL) (2.5 MG/3ML) 0.083% nebulizer solution, Take 3 mLs (2.5 mg total) by nebulization every 6 (six) hours as needed for wheezing or shortness of breath., Disp: 150 mL, Rfl: 1   albuterol (VENTOLIN HFA) 108 (90 Base) MCG/ACT inhaler, Inhale 2 puffs into the lungs every 6 (six) hours as needed for wheezing or shortness of breath., Disp: 8 g, Rfl: 2   cetirizine (ZYRTEC) 10 MG tablet, Take 1 tablet (10 mg total) by mouth daily., Disp: 30 tablet, Rfl: 11   meclizine (ANTIVERT) 25 MG tablet, Take 1 tablet (25 mg total) by mouth 3 (three) times daily as needed for dizziness., Disp: 30 tablet, Rfl: 0   ondansetron (ZOFRAN-ODT) 4 MG disintegrating tablet, Take 1 tablet (4 mg total) by mouth every 8 (eight) hours as needed for nausea or vomiting., Disp: 20 tablet, Rfl: 0   Vitamin D, Ergocalciferol, (DRISDOL) 1.25 MG (50000 UNIT) CAPS capsule, Take 1 capsule (50,000 Units total) by mouth every 7 (seven) days., Disp: 12 capsule, Rfl: 1   omeprazole (PRILOSEC) 20 MG capsule, Take 1 capsule (20 mg total) by mouth daily. (Patient not taking: Reported on 02/22/2022), Disp: 30 capsule, Rfl:  0   No Known Allergies   Review of Systems  Constitutional: Negative.   Respiratory: Negative.    Cardiovascular: Negative.   Neurological: Negative.   Psychiatric/Behavioral: Negative.       Today's Vitals   08/07/22 1054  BP: 108/78  Pulse: 95  Temp: 98.1 F (36.7 C)  SpO2: 99%  Weight: 180 lb 3.2 oz (81.7 kg)  Height: '5\' 5"'$  (1.651 m)   Body mass index is 29.99 kg/m.  Wt Readings from Last 3 Encounters:  08/07/22 180 lb 3.2 oz (81.7 kg)  02/22/22 170 lb 6.4 oz (77.3 kg)  01/02/22 171 lb 4 oz (77.7 kg)    Objective:  Physical Exam Vitals reviewed.  Constitutional:      General: She is not in acute distress.    Appearance: Normal appearance. She is well-developed.  Cardiovascular:     Rate and Rhythm: Normal rate and regular  rhythm.     Pulses: Normal pulses.     Heart sounds: Normal heart sounds. No murmur heard. Pulmonary:     Effort: Pulmonary effort is normal.     Breath sounds: Normal breath sounds.  Chest:     Chest wall: No tenderness.  Musculoskeletal:        General: Normal range of motion.  Skin:    General: Skin is warm and dry.     Capillary Refill: Capillary refill takes less than 2 seconds.  Neurological:     General: No focal deficit present.     Mental Status: She is alert and oriented to person, place, and time.  Psychiatric:        Mood and Affect: Mood normal.        Behavior: Behavior normal.        Thought Content: Thought content normal.        Judgment: Judgment normal.         Assessment And Plan:     1. Less than [redacted] weeks gestation of pregnancy Comments: Urine pregnancy is positive. No pain on examination. Will check a stat BetaHCG if greater than 2000 will order a Urgent transvaginal ultrasound. Advised to call - POCT Urine Pregnancy - Urine cytology ancillary only - Beta HCG, Quant ([facility])  2. Pelvic pain Comments: No pain on physical exam. She may need an urgent ultrasound pending her Beta Hcg - Urine cytology ancillary only - Beta HCG, Quant ([facility])  3. History of endometriosis   Urgent pelvic ultrasound if over 2000 transvaginal, baseline std   Patient was given opportunity to ask questions. Patient verbalized understanding of the plan and was able to repeat key elements of the plan. All questions were answered to their satisfaction.  [name], FNP   I, [name], FNP, have reviewed all documentation for this visit. The documentation on 08/07/22 for the exam, diagnosis, procedures, and orders are all accurate and complete.   IF YOU HAVE BEEN REFERRED TO A SPECIALIST, IT MAY TAKE 1-2 WEEKS TO SCHEDULE/PROCESS THE REFERRAL. IF YOU HAVE NOT HEARD FROM US/SPECIALIST IN TWO WEEKS, PLEASE GIVE [place] A CALL AT [phone].   THE PATIENT IS ENCOURAGED  TO PRACTICE SOCIAL DISTANCING DUE TO THE COVID-19 PANDEMIC.

## 2022-08-07 NOTE — Patient Instructions (Signed)
How a Baby Grows During Pregnancy Pregnancy begins when a female's sperm enters a female's egg. This is called fertilization. Fertilization usually happens in one of the fallopian tubes that connect the ovaries to the uterus. The fertilized egg moves down the fallopian tube to the uterus. Once it reaches the uterus, it implants into the lining of the uterus and begins to grow. For the first 8 weeks, the fertilized egg is called an embryo. After 8 weeks, it is called a fetus. As the fetus continues to grow, it receives oxygen and nutrients through the placenta, which is an organ that grows to support the developing baby. The placenta is the life support system for the baby. It provides oxygen and nutrition and removes waste. How long does a typical pregnancy last? A pregnancy usually lasts 280 days, or about 40 weeks. Pregnancy is divided into three periods of growth, also called trimesters: First trimester: 0-12 weeks. Second trimester: 13-27 weeks. Third trimester: 28-40 weeks. The day when your baby is ready to be born (full term) is your estimated date of delivery. However, most babies are not born on their estimated date of delivery. How does my baby develop month by month?  First month The fertilized egg attaches to the inside of the uterus. Some cells will form the placenta. Others will form the fetus. The arms, legs, brain, spinal cord, lungs, and heart begin to develop. At the end of the first month, the heart begins to beat. Second month The bones, inner ear, eyelids, hands, and feet form. The genitals develop. By the end of 8 weeks, all major organs are developing. Third month All of the internal organs are forming. Teeth develop below the gums. Bones and muscles begin to grow. The spine can flex. The skin is transparent. Fingernails and toenails begin to form. Arms and legs continue to grow longer, and hands and feet develop. The fetus is about 3 inches (7.6 cm) long. Fourth  month The placenta is completely formed. The external sex organs, neck, outer ear, eyebrows, eyelids, and fingernails are formed. The fetus can hear, swallow, and move its arms and legs. The kidneys begin to produce urine. The skin is covered with a white, waxy coating (vernix) and very fine hair (lanugo). Fifth month The fetus moves around more and can be felt for the first time (quickening). The fetus starts to sleep and wake up and may begin to suck a finger. The nails grow to the end of the fingers. The organ in the digestive system that makes bile (gallbladder) functions and helps to digest nutrients. If the fetus is a female, eggs are present in the ovaries. If the fetus is a female, testicles start to move down into the scrotum. Sixth month The lungs are formed. The eyes open. The brain continues to develop. Your baby has fingerprints and toe prints. Your baby's hair grows thicker. At the end of the second trimester, the fetus is about 9 inches (22.9 cm) long. Seventh month The fetus kicks and stretches. The eyes are developed enough to sense changes in light. The hands can make a grasping motion. The fetus responds to sound. Eighth month Most organs and body systems are fully developed and functioning. Bones harden, and taste buds develop. The fetus may hiccup. Certain areas of the brain are still developing. The skull remains soft. Ninth month The fetus gains about  lb (0.23 kg) each week. The lungs are fully developed. Patterns of sleep develop. The fetus's head typically moves into  a head-down position (vertex) in the uterus to prepare for birth. The fetus weighs 6-9 lb (2.72-4.08 kg) and is 19-20 inches (48.26-50.8 cm) long. How do I know if my baby is developing well? Always talk with your health care provider about any concerns that you may have about your pregnancy and your baby. At each prenatal visit, your health care provider will do several different tests to check  on your health and keep track of your baby's development. These include: Fundal height and position. To do this, your health care provider will: Measure your growing belly from your pubic bone to the top of the uterus using a tape measure. Feel your belly to determine your baby's position. Heartbeat. An ultrasound in the first trimester can confirm pregnancy and show a heartbeat, depending on how far along you are. Your health care provider will check your baby's heart rate at every prenatal visit. You will also have a second trimester ultrasound to check your baby's development. Follow these instructions at home: Take prenatal vitamins as told by your health care provider. These include vitamins such as folic acid, iron, calcium, and vitamin D. They are important for healthy development. Take over-the-counter and prescription medicines only as told by your health care provider. Keep all follow-up visits. This is important. Follow-up visits include prenatal care and screening tests. Summary A pregnancy usually lasts 280 days, or about 40 weeks. Pregnancy is divided into three periods of growth, also called trimesters. Your health care provider will monitor your baby's growth and development throughout your pregnancy. Follow your health care provider's recommendations about taking prenatal vitamins and medicines during your pregnancy. Talk with your health care provider if you have any concerns about your pregnancy or your developing baby. This information is not intended to replace advice given to you by your health care provider. Make sure you discuss any questions you have with your health care provider. Document Revised: 02/24/2020 Document Reviewed: 12/31/2019 Elsevier Patient Education  [employer].

## 2022-08-08 LAB — BETA HCG QUANT (REF LAB): hCG Quant: 163 m[IU]/mL

## 2022-08-17 ENCOUNTER — Encounter: Payer: Self-pay | Admitting: Obstetrics & Gynecology

## 2022-08-17 ENCOUNTER — Other Ambulatory Visit: Payer: Self-pay | Admitting: Obstetrics & Gynecology

## 2022-08-17 DIAGNOSIS — R1030 Lower abdominal pain, unspecified: Secondary | ICD-10-CM

## 2022-08-17 DIAGNOSIS — Z8742 Personal history of other diseases of the female genital tract: Secondary | ICD-10-CM

## 2022-08-17 DIAGNOSIS — Z331 Pregnant state, incidental: Secondary | ICD-10-CM

## 2022-08-17 DIAGNOSIS — R102 Pelvic and perineal pain: Secondary | ICD-10-CM

## 2022-08-20 ENCOUNTER — Ambulatory Visit
Admission: RE | Admit: 2022-08-20 | Discharge: 2022-08-20 | Disposition: A | Discharge status: Home / Self Care | Payer: 59 | Source: Ambulatory Visit | Attending: Obstetrics & Gynecology | Admitting: Obstetrics & Gynecology

## 2022-08-20 DIAGNOSIS — Z8742 Personal history of other diseases of the female genital tract: Secondary | ICD-10-CM

## 2022-08-20 DIAGNOSIS — R1030 Lower abdominal pain, unspecified: Secondary | ICD-10-CM | POA: Diagnosis not present

## 2022-08-20 DIAGNOSIS — Z331 Pregnant state, incidental: Secondary | ICD-10-CM | POA: Diagnosis present

## 2022-08-21 LAB — OB RESULTS CONSOLE GC/CHLAMYDIA
Chlamydia: NEGATIVE
Neisseria Gonorrhea: NEGATIVE

## 2022-09-17 LAB — OB RESULTS CONSOLE ANTIBODY SCREEN: Antibody Screen: NEGATIVE

## 2022-09-17 LAB — OB RESULTS CONSOLE HIV ANTIBODY (ROUTINE TESTING): HIV: NONREACTIVE

## 2022-09-17 LAB — OB RESULTS CONSOLE ABO/RH: RH Type: POSITIVE

## 2022-09-17 LAB — OB RESULTS CONSOLE RPR: RPR: NONREACTIVE

## 2022-09-17 LAB — OB RESULTS CONSOLE RUBELLA ANTIBODY, IGM: Rubella: IMMUNE

## 2022-09-17 LAB — OB RESULTS CONSOLE HEPATITIS B SURFACE ANTIGEN: Hepatitis B Surface Ag: NEGATIVE

## 2022-10-12 DIAGNOSIS — Z3401 Encounter for supervision of normal first pregnancy, first trimester: Secondary | ICD-10-CM | POA: Diagnosis not present

## 2022-10-12 DIAGNOSIS — R112 Nausea with vomiting, unspecified: Secondary | ICD-10-CM | POA: Diagnosis not present

## 2022-11-09 ENCOUNTER — Other Ambulatory Visit: Payer: Self-pay | Admitting: Obstetrics & Gynecology

## 2022-11-09 DIAGNOSIS — Z36 Encounter for antenatal screening for chromosomal anomalies: Secondary | ICD-10-CM | POA: Diagnosis not present

## 2022-11-09 DIAGNOSIS — Z363 Encounter for antenatal screening for malformations: Secondary | ICD-10-CM

## 2022-11-15 ENCOUNTER — Other Ambulatory Visit: Payer: Self-pay

## 2022-11-27 ENCOUNTER — Encounter: Payer: Self-pay | Admitting: *Deleted

## 2022-11-27 DIAGNOSIS — Z3482 Encounter for supervision of other normal pregnancy, second trimester: Secondary | ICD-10-CM | POA: Diagnosis not present

## 2022-11-27 DIAGNOSIS — Z3483 Encounter for supervision of other normal pregnancy, third trimester: Secondary | ICD-10-CM | POA: Diagnosis not present

## 2022-11-29 ENCOUNTER — Other Ambulatory Visit: Payer: Self-pay | Admitting: Obstetrics & Gynecology

## 2022-11-29 ENCOUNTER — Ambulatory Visit: Payer: Commercial Managed Care - PPO | Attending: Obstetrics & Gynecology

## 2022-11-29 DIAGNOSIS — Z3A19 19 weeks gestation of pregnancy: Secondary | ICD-10-CM

## 2022-11-29 DIAGNOSIS — Z363 Encounter for antenatal screening for malformations: Secondary | ICD-10-CM

## 2022-11-30 ENCOUNTER — Ambulatory Visit: Payer: Commercial Managed Care - PPO

## 2022-12-19 DIAGNOSIS — L910 Hypertrophic scar: Secondary | ICD-10-CM | POA: Diagnosis not present

## 2022-12-19 DIAGNOSIS — R300 Dysuria: Secondary | ICD-10-CM | POA: Diagnosis not present

## 2022-12-19 DIAGNOSIS — E049 Nontoxic goiter, unspecified: Secondary | ICD-10-CM | POA: Diagnosis not present

## 2022-12-19 DIAGNOSIS — R350 Frequency of micturition: Secondary | ICD-10-CM | POA: Diagnosis not present

## 2022-12-19 DIAGNOSIS — D259 Leiomyoma of uterus, unspecified: Secondary | ICD-10-CM | POA: Diagnosis not present

## 2022-12-19 DIAGNOSIS — Z3A22 22 weeks gestation of pregnancy: Secondary | ICD-10-CM | POA: Diagnosis not present

## 2023-01-04 DIAGNOSIS — E049 Nontoxic goiter, unspecified: Secondary | ICD-10-CM | POA: Diagnosis not present

## 2023-01-04 DIAGNOSIS — Z369 Encounter for antenatal screening, unspecified: Secondary | ICD-10-CM | POA: Diagnosis not present

## 2023-02-01 DIAGNOSIS — D259 Leiomyoma of uterus, unspecified: Secondary | ICD-10-CM | POA: Diagnosis not present

## 2023-02-01 DIAGNOSIS — L910 Hypertrophic scar: Secondary | ICD-10-CM | POA: Diagnosis not present

## 2023-02-01 DIAGNOSIS — R1011 Right upper quadrant pain: Secondary | ICD-10-CM | POA: Diagnosis not present

## 2023-02-01 DIAGNOSIS — Z23 Encounter for immunization: Secondary | ICD-10-CM | POA: Diagnosis not present

## 2023-02-01 DIAGNOSIS — R12 Heartburn: Secondary | ICD-10-CM | POA: Diagnosis not present

## 2023-02-01 DIAGNOSIS — N809 Endometriosis, unspecified: Secondary | ICD-10-CM | POA: Diagnosis not present

## 2023-02-01 DIAGNOSIS — E049 Nontoxic goiter, unspecified: Secondary | ICD-10-CM | POA: Diagnosis not present

## 2023-02-01 DIAGNOSIS — Z3A29 29 weeks gestation of pregnancy: Secondary | ICD-10-CM | POA: Diagnosis not present

## 2023-02-04 DIAGNOSIS — D649 Anemia, unspecified: Secondary | ICD-10-CM | POA: Insufficient documentation

## 2023-02-19 ENCOUNTER — Encounter: Payer: Self-pay | Admitting: *Deleted

## 2023-02-19 ENCOUNTER — Other Ambulatory Visit: Payer: Self-pay | Admitting: Obstetrics and Gynecology

## 2023-02-19 DIAGNOSIS — D259 Leiomyoma of uterus, unspecified: Secondary | ICD-10-CM | POA: Insufficient documentation

## 2023-02-19 DIAGNOSIS — Z363 Encounter for antenatal screening for malformations: Secondary | ICD-10-CM

## 2023-02-20 ENCOUNTER — Ambulatory Visit: Payer: Commercial Managed Care - PPO | Attending: Obstetrics and Gynecology

## 2023-02-20 ENCOUNTER — Ambulatory Visit: Payer: Commercial Managed Care - PPO | Admitting: *Deleted

## 2023-02-20 VITALS — BP 98/55 | HR 101

## 2023-02-20 DIAGNOSIS — D259 Leiomyoma of uterus, unspecified: Secondary | ICD-10-CM | POA: Diagnosis not present

## 2023-02-20 DIAGNOSIS — Z363 Encounter for antenatal screening for malformations: Secondary | ICD-10-CM

## 2023-02-20 DIAGNOSIS — O3410 Maternal care for benign tumor of corpus uteri, unspecified trimester: Secondary | ICD-10-CM | POA: Insufficient documentation

## 2023-03-04 ENCOUNTER — Encounter: Payer: 59 | Admitting: Nurse Practitioner

## 2023-03-07 ENCOUNTER — Encounter: Payer: 59 | Admitting: Nurse Practitioner

## 2023-03-19 LAB — OB RESULTS CONSOLE GBS: GBS: POSITIVE

## 2023-04-08 DIAGNOSIS — Z3A39 39 weeks gestation of pregnancy: Secondary | ICD-10-CM | POA: Diagnosis not present

## 2023-04-08 DIAGNOSIS — D649 Anemia, unspecified: Secondary | ICD-10-CM | POA: Diagnosis not present

## 2023-04-08 DIAGNOSIS — N898 Other specified noninflammatory disorders of vagina: Secondary | ICD-10-CM | POA: Diagnosis not present

## 2023-04-08 DIAGNOSIS — Z3403 Encounter for supervision of normal first pregnancy, third trimester: Secondary | ICD-10-CM | POA: Diagnosis not present

## 2023-04-08 DIAGNOSIS — D259 Leiomyoma of uterus, unspecified: Secondary | ICD-10-CM | POA: Diagnosis not present

## 2023-04-08 DIAGNOSIS — N809 Endometriosis, unspecified: Secondary | ICD-10-CM | POA: Diagnosis not present

## 2023-04-08 DIAGNOSIS — Z22330 Carrier of Group B streptococcus: Secondary | ICD-10-CM | POA: Diagnosis not present

## 2023-04-22 ENCOUNTER — Telehealth (HOSPITAL_COMMUNITY): Payer: Self-pay | Admitting: *Deleted

## 2023-04-22 ENCOUNTER — Encounter (HOSPITAL_COMMUNITY): Payer: Self-pay

## 2023-04-22 ENCOUNTER — Other Ambulatory Visit: Payer: Self-pay | Admitting: Obstetrics and Gynecology

## 2023-04-22 NOTE — Telephone Encounter (Signed)
Preadmission screen  

## 2023-04-24 ENCOUNTER — Encounter (HOSPITAL_COMMUNITY): Payer: Self-pay | Admitting: *Deleted

## 2023-04-24 ENCOUNTER — Telehealth (HOSPITAL_COMMUNITY): Payer: Self-pay | Admitting: *Deleted

## 2023-04-24 NOTE — Telephone Encounter (Signed)
Preadmission screen  

## 2023-04-26 ENCOUNTER — Inpatient Hospital Stay (HOSPITAL_COMMUNITY)
Admission: AD | Admit: 2023-04-26 | Discharge: 2023-04-30 | DRG: 788 | Disposition: A | Discharge status: Home / Self Care | Payer: Commercial Managed Care - PPO | Attending: Obstetrics and Gynecology | Admitting: Obstetrics and Gynecology

## 2023-04-26 ENCOUNTER — Encounter (HOSPITAL_COMMUNITY): Payer: Self-pay | Admitting: Obstetrics and Gynecology

## 2023-04-26 ENCOUNTER — Other Ambulatory Visit: Payer: Self-pay

## 2023-04-26 DIAGNOSIS — Z98891 History of uterine scar from previous surgery: Principal | ICD-10-CM

## 2023-04-26 DIAGNOSIS — Z3A41 41 weeks gestation of pregnancy: Secondary | ICD-10-CM

## 2023-04-26 DIAGNOSIS — D251 Intramural leiomyoma of uterus: Secondary | ICD-10-CM | POA: Diagnosis present

## 2023-04-26 DIAGNOSIS — Z8616 Personal history of COVID-19: Secondary | ICD-10-CM

## 2023-04-26 DIAGNOSIS — O9952 Diseases of the respiratory system complicating childbirth: Secondary | ICD-10-CM | POA: Diagnosis not present

## 2023-04-26 DIAGNOSIS — O321XX0 Maternal care for breech presentation, not applicable or unspecified: Secondary | ICD-10-CM | POA: Diagnosis not present

## 2023-04-26 DIAGNOSIS — O480 Post-term pregnancy: Secondary | ICD-10-CM | POA: Diagnosis not present

## 2023-04-26 DIAGNOSIS — D259 Leiomyoma of uterus, unspecified: Secondary | ICD-10-CM | POA: Diagnosis not present

## 2023-04-26 DIAGNOSIS — J4540 Moderate persistent asthma, uncomplicated: Secondary | ICD-10-CM | POA: Diagnosis present

## 2023-04-26 DIAGNOSIS — O99824 Streptococcus B carrier state complicating childbirth: Secondary | ICD-10-CM | POA: Diagnosis present

## 2023-04-26 DIAGNOSIS — O26893 Other specified pregnancy related conditions, third trimester: Secondary | ICD-10-CM | POA: Diagnosis present

## 2023-04-26 DIAGNOSIS — O99820 Streptococcus B carrier state complicating pregnancy: Secondary | ICD-10-CM | POA: Diagnosis not present

## 2023-04-26 DIAGNOSIS — O3413 Maternal care for benign tumor of corpus uteri, third trimester: Secondary | ICD-10-CM | POA: Diagnosis present

## 2023-04-26 DIAGNOSIS — Z3A00 Weeks of gestation of pregnancy not specified: Secondary | ICD-10-CM | POA: Diagnosis not present

## 2023-04-26 DIAGNOSIS — O99892 Other specified diseases and conditions complicating childbirth: Secondary | ICD-10-CM | POA: Insufficient documentation

## 2023-04-26 LAB — CBC
HCT: 37.2 % (ref 36.0–46.0)
Hemoglobin: 11.5 g/dL — ABNORMAL LOW (ref 12.0–15.0)
MCH: 26.1 pg (ref 26.0–34.0)
MCHC: 30.9 g/dL (ref 30.0–36.0)
MCV: 84.4 fL (ref 80.0–100.0)
Platelets: 204 10*3/uL (ref 150–400)
RBC: 4.41 MIL/uL (ref 3.87–5.11)
RDW: 14.3 % (ref 11.5–15.5)
WBC: 8 10*3/uL (ref 4.0–10.5)
nRBC: 0 % (ref 0.0–0.2)

## 2023-04-26 LAB — TYPE AND SCREEN
ABO/RH(D): O POS
Antibody Screen: NEGATIVE

## 2023-04-26 MED ORDER — LACTATED RINGERS IV SOLN: INTRAVENOUS | Status: DC

## 2023-04-26 MED ORDER — SOD CITRATE-CITRIC ACID 500-334 MG/5ML PO SOLN
30.0000 mL | ORAL | Status: DC | PRN
  Filled 2023-04-26: qty 30

## 2023-04-26 MED ORDER — OXYTOCIN-SODIUM CHLORIDE 30-0.9 UT/500ML-% IV SOLN: 2.5000 [IU]/h | INTRAVENOUS | Status: DC

## 2023-04-26 MED ORDER — FLEET ENEMA 7-19 GM/118ML RE ENEM: 1.0000 | ENEMA | RECTAL | Status: DC | PRN

## 2023-04-26 MED ORDER — SOD CITRATE-CITRIC ACID 500-334 MG/5ML PO SOLN: 30.0000 mL | ORAL | Status: DC | PRN

## 2023-04-26 MED ORDER — LIDOCAINE HCL (PF) 1 % IJ SOLN: 30.0000 mL | INTRAMUSCULAR | Status: DC | PRN

## 2023-04-26 MED ORDER — ACETAMINOPHEN 325 MG PO TABS: 650.0000 mg | ORAL_TABLET | ORAL | Status: DC | PRN

## 2023-04-26 MED ORDER — LACTATED RINGERS IV SOLN
500.0000 mL | INTRAVENOUS | Status: DC | PRN
  Administered 2023-04-27: 500 mL via INTRAVENOUS

## 2023-04-26 MED ORDER — SODIUM CHLORIDE 0.9 % IV SOLN
5.0000 10*6.[IU] | Freq: Once | INTRAVENOUS | Status: AC
  Administered 2023-04-26: 5 10*6.[IU] via INTRAVENOUS
  Filled 2023-04-26: qty 5

## 2023-04-26 MED ORDER — ONDANSETRON HCL 4 MG/2ML IJ SOLN: 4.0000 mg | Freq: Four times a day (QID) | INTRAMUSCULAR | Status: DC | PRN

## 2023-04-26 MED ORDER — PENICILLIN G POT IN DEXTROSE 60000 UNIT/ML IV SOLN
3.0000 10*6.[IU] | INTRAVENOUS | Status: DC
  Administered 2023-04-26 – 2023-04-27 (×3): 3 10*6.[IU] via INTRAVENOUS
  Filled 2023-04-26 (×3): qty 50

## 2023-04-26 MED ORDER — LACTATED RINGERS IV SOLN: 500.0000 mL | INTRAVENOUS | Status: DC | PRN

## 2023-04-26 MED ORDER — ONDANSETRON HCL 4 MG/2ML IJ SOLN
4.0000 mg | Freq: Four times a day (QID) | INTRAMUSCULAR | Status: DC | PRN
  Administered 2023-04-26: 4 mg via INTRAVENOUS
  Filled 2023-04-26: qty 2

## 2023-04-26 MED ORDER — OXYTOCIN BOLUS FROM INFUSION: 333.0000 mL | Freq: Once | INTRAVENOUS | Status: DC

## 2023-04-26 NOTE — H&P (Signed)
OB ADMISSION/ HISTORY & PHYSICAL:  Admission Date: 04/26/2023  3:08 PM  Admit Diagnosis: Normal labor  Stacey Key is a 37 y.o. female G1P0 [redacted]w[redacted]d presenting for labor eval. Endorses active FM, denies LOF and vaginal bleeding. Ctx began @ 1100  History of current pregnancy: G1P0   Patient entered care with [facility] at 9 wks.   EDC 04/19/23 by LMP   Last evaluation: 32+6  wks cephalic, posterior placenta, 3 intramural fibroids seen on imaging   Significant prenatal events:  Patient Active Problem List   Diagnosis Date Noted   Uterine fibroid 02/19/2023   Moderate persistent asthma 08/16/2017    Prenatal Labs: ABO, Rh: O/Positive/-- (12/18 0000) Antibody: Negative (12/18 0000) Rubella: Immune (12/18 0000)  RPR: Nonreactive (12/18 0000)  HBsAg: Negative (12/18 0000)  HIV: Non-reactive (12/18 0000)  GTT: normal 1 hr GBS: Positive/-- (06/18 0000)  GC/CHL: neg/neg Genetics: low-risk, normal [employer] Vaccines:   OB History  Gravida Para Term Preterm AB Living  1            SAB IAB Ectopic Multiple Live Births               # Outcome Date GA Lbr Len/2nd Weight Sex Type Anes PTL Lv  1 Current             Medical / Surgical History: Past medical history:  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   Cough 01/14/2020   Dysmenorrhea 08/22/2016   Endometriosis    GERD (gastroesophageal reflux disease)    GERD (gastroesophageal reflux disease) 11/13/2011   History of asthma 01/14/2020   History of COVID-19 01/14/2020   Ovarian cyst    Ovarian cyst, complex 05/05/2012   Left complex 5x 3cm ovarian cyst   Seasonal allergic rhinitis 08/16/2017   Vitamin D deficiency    Wheezing 01/14/2020    Past surgical history:  Past Surgical History:  Procedure Laterality Date   HERNIA REPAIR     LAPAROSCOPY N/A 08/22/2016   Procedure:  LAPAROSCOPY OPERATIVE,excision of posterior culdesac endometriosis;  Surgeon: Hal Morales, MD;  Location: [facility];  Service: Gynecology;  Laterality: N/A;   WISDOM TOOTH EXTRACTION     Family History:  Family History  Problem Relation Age of Onset   Anemia Mother    GER disease Mother    Hypertension Father    Hypertension Maternal Grandmother    Breast cancer Maternal Grandmother 50       over 31   Rheum arthritis Maternal Grandmother    Hypertension Maternal Grandfather    Clotting disorder Maternal Grandfather    Skin cancer Maternal Grandfather    Breast cancer Paternal Grandmother    Hypertension Paternal Grandfather    Lung cancer Paternal Grandfather    Asthma Maternal Aunt    Other Neg Hx     Social History:  reports that she has never smoked. She has never used smokeless tobacco. She reports that she does not drink alcohol and does not use drugs.  Allergies: Patient has no known allergies.   Current Medications at time of admission:  Prior to Admission medications   Medication Sig Start Date End Date Taking? Authorizing Provider  ondansetron (ZOFRAN-ODT) 4 MG disintegrating tablet Take 1 tablet (4 mg total) by mouth every 8 (eight) hours as  needed for nausea or vomiting. 06/26/22  Yes Deeann Saint, MD  Prenatal Vit-Fe Fumarate-FA (PRENATAL MULTIVITAMIN) TABS tablet Take 1 tablet by mouth daily at 12 noon.   Yes [provider]  Vitamin D, Ergocalciferol, (DRISDOL) 1.25 MG (50000 UNIT) CAPS capsule Take 1 capsule (50,000 Units total) by mouth every 7 (seven) days. 02/23/22  Yes Arnette Felts, FNP  albuterol (PROVENTIL) (2.5 MG/3ML) 0.083% nebulizer solution Take 3 mLs (2.5 mg total) by nebulization every 6 (six) hours as needed for wheezing or shortness of breath. 01/13/20   Ivonne Andrew, NP  albuterol (VENTOLIN HFA) 108 (90 Base) MCG/ACT inhaler Inhale 2 puffs into the lungs every 6 (six) hours as needed for wheezing or shortness of breath. 10/06/21    Rolla Etienne, NP  cetirizine (ZYRTEC) 10 MG tablet Take 1 tablet (10 mg total) by mouth daily. 08/12/18   Doristine Bosworth, MD  meclizine (ANTIVERT) 25 MG tablet Take 1 tablet (25 mg total) by mouth 3 (three) times daily as needed for dizziness. Patient not taking: Reported on 02/20/2023 06/26/22   Deeann Saint, MD  omeprazole (PRILOSEC) 20 MG capsule Take 1 capsule (20 mg total) by mouth daily. Patient not taking: Reported on 02/22/2022 01/02/22   [place], Betty G, MD    Review of Systems: Constitutional: Negative   HENT: Negative   Eyes: Negative   Respiratory: Negative   Cardiovascular: Negative   Gastrointestinal: Negative  Genitourinary: neg for bloody show, neg for LOF   Musculoskeletal: Negative   Skin: Negative   Neurological: Negative   Endo/Heme/Allergies: Negative   Psychiatric/Behavioral: Negative    Physical Exam: VS: Blood pressure 137/84, pulse 87, temperature 98.7 F (37.1 C), resp. rate 18, height 5\' 8"  (1.727 m), weight 93 kg, last menstrual period 07/13/2022. AAO x3, no signs of distress Cardiovascular: RRR Respiratory: Unlabored GU/GI: Abdomen gravid, non-tender, non-distended, active FM, vertex Extremities: trace edema, negative for pain, tenderness, and cords  Cervical exam:Dilation: 3 Effacement (%): 60 Station: -3 Exam by:: K. Cowher RN FHR: baseline rate 135 / variability moderate / accelerations present / absent decelerations TOCO: 2 min   Prenatal Transfer Tool  Maternal Diabetes: No Genetic Screening: Normal Maternal Ultrasounds/Referrals: Normal Fetal Ultrasounds or other Referrals:  None Maternal Substance Abuse:  No Significant Maternal Medications:  Meds include: Other:  Significant Maternal Lab Results: Other: Asthma meds Number of Prenatal Visits:greater than 3 verified prenatal visits Other Comments:  None    Assessment: 37 y.o. G1P0 [redacted]w[redacted]d  Latent stage of labor FHR category 1 GBS pos Pain management plan: per pt's  request   Plan:  Admit to L&D Routine admission orders Epidural PRN Expectant management, AROM augmentation PRN Dr Normand Sloop notified of admission and plan of care  Roma Schanz DNP, CNM 04/26/2023 3:57 PM

## 2023-04-26 NOTE — MAU Note (Signed)
.  Stacey Key is a 37 y.o. at [redacted]w[redacted]d here in MAU reporting: ctx since 11am now 6 min aprt. Reports mucus plug still coming out but not  bleeding or leaking. Good fetal movement flet Onset of complaint: 11am Pain score: 7-8 Vitals:   04/26/23 1522  BP: 124/80  Pulse: 100  Resp: 18  Temp: 98.7 F (37.1 C)     FHT:142 Lab orders placed from triage:  labor eval

## 2023-04-27 ENCOUNTER — Encounter (HOSPITAL_COMMUNITY)
Admission: AD | Disposition: A | Discharge status: Home / Self Care | Payer: Self-pay | Source: Home / Self Care | Attending: Obstetrics and Gynecology

## 2023-04-27 ENCOUNTER — Encounter (HOSPITAL_COMMUNITY): Payer: Self-pay | Admitting: Obstetrics and Gynecology

## 2023-04-27 ENCOUNTER — Inpatient Hospital Stay (HOSPITAL_COMMUNITY): Payer: Commercial Managed Care - PPO

## 2023-04-27 ENCOUNTER — Inpatient Hospital Stay (HOSPITAL_COMMUNITY): Payer: Commercial Managed Care - PPO | Admitting: Anesthesiology

## 2023-04-27 ENCOUNTER — Inpatient Hospital Stay (HOSPITAL_COMMUNITY)
Admission: AD | Admit: 2023-04-27 | Payer: Commercial Managed Care - PPO | Source: Home / Self Care | Admitting: Obstetrics and Gynecology

## 2023-04-27 DIAGNOSIS — Z3A41 41 weeks gestation of pregnancy: Secondary | ICD-10-CM

## 2023-04-27 DIAGNOSIS — Z3A00 Weeks of gestation of pregnancy not specified: Secondary | ICD-10-CM

## 2023-04-27 DIAGNOSIS — O480 Post-term pregnancy: Secondary | ICD-10-CM

## 2023-04-27 DIAGNOSIS — O321XX0 Maternal care for breech presentation, not applicable or unspecified: Secondary | ICD-10-CM

## 2023-04-27 DIAGNOSIS — O99820 Streptococcus B carrier state complicating pregnancy: Secondary | ICD-10-CM

## 2023-04-27 SURGERY — Surgical Case
Anesthesia: Epidural

## 2023-04-27 MED ORDER — GABAPENTIN 100 MG PO CAPS: 100.0000 mg | ORAL_CAPSULE | Freq: Two times a day (BID) | ORAL | Status: DC | PRN

## 2023-04-27 MED ORDER — NALOXONE HCL 0.4 MG/ML IJ SOLN: 0.4000 mg | INTRAMUSCULAR | Status: DC | PRN

## 2023-04-27 MED ORDER — PRENATAL MULTIVITAMIN CH
1.0000 | ORAL_TABLET | Freq: Every day | ORAL | Status: DC
  Filled 2023-04-27: qty 1

## 2023-04-27 MED ORDER — PHENYLEPHRINE 80 MCG/ML (10ML) SYRINGE FOR IV PUSH (FOR BLOOD PRESSURE SUPPORT)
80.0000 ug | PREFILLED_SYRINGE | INTRAVENOUS | Status: DC | PRN
  Administered 2023-04-27: 80 ug via INTRAVENOUS

## 2023-04-27 MED ORDER — COMPLETENATE 29-1 MG PO CHEW
1.0000 | CHEWABLE_TABLET | Freq: Every day | ORAL | Status: DC
  Administered 2023-04-27 – 2023-04-30 (×4): 1 via ORAL
  Filled 2023-04-27 (×4): qty 1

## 2023-04-27 MED ORDER — TRANEXAMIC ACID-NACL 1000-0.7 MG/100ML-% IV SOLN
INTRAVENOUS | Status: DC | PRN
  Administered 2023-04-27: 1000 mg via INTRAVENOUS

## 2023-04-27 MED ORDER — FENTANYL CITRATE (PF) 100 MCG/2ML IJ SOLN
INTRAMUSCULAR | Status: DC | PRN
  Administered 2023-04-27: 100 ug via EPIDURAL

## 2023-04-27 MED ORDER — IBUPROFEN 100 MG/5ML PO SUSP
600.0000 mg | Freq: Four times a day (QID) | ORAL | Status: DC
  Administered 2023-04-27 – 2023-04-30 (×12): 600 mg via ORAL
  Filled 2023-04-27 (×11): qty 30

## 2023-04-27 MED ORDER — CEFAZOLIN SODIUM-DEXTROSE 2-4 GM/100ML-% IV SOLN
INTRAVENOUS | Status: AC
  Filled 2023-04-27: qty 100

## 2023-04-27 MED ORDER — OXYTOCIN-SODIUM CHLORIDE 30-0.9 UT/500ML-% IV SOLN
2.5000 [IU]/h | INTRAVENOUS | Status: AC
  Administered 2023-04-27: 2.5 [IU]/h via INTRAVENOUS

## 2023-04-27 MED ORDER — SIMETHICONE 80 MG PO CHEW: 80.0000 mg | CHEWABLE_TABLET | ORAL | Status: DC | PRN

## 2023-04-27 MED ORDER — MORPHINE SULFATE (PF) 0.5 MG/ML IJ SOLN
INTRAMUSCULAR | Status: AC
  Filled 2023-04-27: qty 10

## 2023-04-27 MED ORDER — LIDOCAINE HCL (PF) 1 % IJ SOLN
INTRAMUSCULAR | Status: DC | PRN
  Administered 2023-04-27: 5 mL via EPIDURAL
  Administered 2023-04-27: 2 mL via EPIDURAL
  Administered 2023-04-27: 3 mL via EPIDURAL

## 2023-04-27 MED ORDER — FENTANYL CITRATE (PF) 100 MCG/2ML IJ SOLN: 100.0000 ug | INTRAMUSCULAR | Status: DC | PRN

## 2023-04-27 MED ORDER — OXYCODONE HCL 5 MG PO TABS: 5.0000 mg | ORAL_TABLET | ORAL | Status: DC | PRN

## 2023-04-27 MED ORDER — STERILE WATER FOR IRRIGATION IR SOLN
Status: DC | PRN
  Administered 2023-04-27: 1

## 2023-04-27 MED ORDER — METOCLOPRAMIDE HCL 5 MG/ML IJ SOLN
INTRAMUSCULAR | Status: AC
  Filled 2023-04-27: qty 2

## 2023-04-27 MED ORDER — DIBUCAINE (PERIANAL) 1 % EX OINT: 1.0000 | TOPICAL_OINTMENT | CUTANEOUS | Status: DC | PRN

## 2023-04-27 MED ORDER — ACETAMINOPHEN 160 MG/5ML PO SOLN
1000.0000 mg | Freq: Four times a day (QID) | ORAL | Status: DC
  Administered 2023-04-27 – 2023-04-30 (×11): 1000 mg via ORAL
  Filled 2023-04-27 (×12): qty 40.6

## 2023-04-27 MED ORDER — KETOROLAC TROMETHAMINE 30 MG/ML IJ SOLN: 30.0000 mg | Freq: Four times a day (QID) | INTRAMUSCULAR | Status: AC | PRN

## 2023-04-27 MED ORDER — DIPHENHYDRAMINE HCL 50 MG/ML IJ SOLN: 12.5000 mg | INTRAMUSCULAR | Status: DC | PRN

## 2023-04-27 MED ORDER — SIMETHICONE 80 MG PO CHEW
80.0000 mg | CHEWABLE_TABLET | Freq: Three times a day (TID) | ORAL | Status: DC
  Administered 2023-04-27 – 2023-04-30 (×9): 80 mg via ORAL
  Filled 2023-04-27 (×10): qty 1

## 2023-04-27 MED ORDER — CEFAZOLIN SODIUM-DEXTROSE 2-3 GM-%(50ML) IV SOLR
INTRAVENOUS | Status: DC | PRN
  Administered 2023-04-27: 2 g via INTRAVENOUS

## 2023-04-27 MED ORDER — ONDANSETRON HCL 4 MG/2ML IJ SOLN: 4.0000 mg | Freq: Three times a day (TID) | INTRAMUSCULAR | Status: DC | PRN

## 2023-04-27 MED ORDER — NALOXONE HCL 4 MG/10ML IJ SOLN: 1.0000 ug/kg/h | INTRAVENOUS | Status: DC | PRN

## 2023-04-27 MED ORDER — ONDANSETRON HCL 4 MG/2ML IJ SOLN
INTRAMUSCULAR | Status: DC | PRN
  Administered 2023-04-27: 4 mg via INTRAVENOUS

## 2023-04-27 MED ORDER — KETOROLAC TROMETHAMINE 30 MG/ML IJ SOLN: 30.0000 mg | Freq: Once | INTRAMUSCULAR | Status: DC | PRN

## 2023-04-27 MED ORDER — FENTANYL-BUPIVACAINE-NACL 0.5-0.125-0.9 MG/250ML-% EP SOLN
12.0000 mL/h | EPIDURAL | Status: DC | PRN
  Administered 2023-04-27: 12 mL/h via EPIDURAL
  Filled 2023-04-27: qty 250

## 2023-04-27 MED ORDER — COCONUT OIL OIL
1.0000 | TOPICAL_OIL | Status: DC | PRN
  Administered 2023-04-28: 1 via TOPICAL

## 2023-04-27 MED ORDER — SODIUM CHLORIDE 0.9% FLUSH: 3.0000 mL | INTRAVENOUS | Status: DC | PRN

## 2023-04-27 MED ORDER — MORPHINE SULFATE (PF) 0.5 MG/ML IJ SOLN
INTRAMUSCULAR | Status: DC | PRN
  Administered 2023-04-27: 3 mg via EPIDURAL

## 2023-04-27 MED ORDER — SODIUM CHLORIDE 0.9 % IR SOLN
Status: DC | PRN
  Administered 2023-04-27: 1

## 2023-04-27 MED ORDER — LACTATED RINGERS IV SOLN: 500.0000 mL | Freq: Once | INTRAVENOUS | Status: DC

## 2023-04-27 MED ORDER — LACTATED RINGERS AMNIOINFUSION: INTRAVENOUS | Status: DC

## 2023-04-27 MED ORDER — IBUPROFEN 600 MG PO TABS
600.0000 mg | ORAL_TABLET | Freq: Four times a day (QID) | ORAL | Status: DC
  Filled 2023-04-27: qty 1

## 2023-04-27 MED ORDER — DEXAMETHASONE SODIUM PHOSPHATE 10 MG/ML IJ SOLN
INTRAMUSCULAR | Status: DC | PRN
  Administered 2023-04-27: 10 mg via INTRAVENOUS

## 2023-04-27 MED ORDER — SOD CITRATE-CITRIC ACID 500-334 MG/5ML PO SOLN: 30.0000 mL | ORAL | Status: DC

## 2023-04-27 MED ORDER — EPHEDRINE 5 MG/ML INJ: 10.0000 mg | INTRAVENOUS | Status: DC | PRN

## 2023-04-27 MED ORDER — FENTANYL CITRATE (PF) 100 MCG/2ML IJ SOLN
INTRAMUSCULAR | Status: AC
  Filled 2023-04-27: qty 2

## 2023-04-27 MED ORDER — ONDANSETRON HCL 4 MG/2ML IJ SOLN
INTRAMUSCULAR | Status: AC
  Filled 2023-04-27: qty 2

## 2023-04-27 MED ORDER — MORPHINE SULFATE (PF) 0.5 MG/ML IJ SOLN
INTRAMUSCULAR | Status: DC | PRN
  Administered 2023-04-27 (×2): 1 mg via INTRAVENOUS

## 2023-04-27 MED ORDER — DEXAMETHASONE SODIUM PHOSPHATE 10 MG/ML IJ SOLN
INTRAMUSCULAR | Status: AC
  Filled 2023-04-27: qty 1

## 2023-04-27 MED ORDER — DIPHENHYDRAMINE HCL 25 MG PO CAPS: 25.0000 mg | ORAL_CAPSULE | ORAL | Status: DC | PRN

## 2023-04-27 MED ORDER — SENNOSIDES-DOCUSATE SODIUM 8.6-50 MG PO TABS
2.0000 | ORAL_TABLET | Freq: Every day | ORAL | Status: DC
  Administered 2023-04-28 – 2023-04-30 (×2): 2 via ORAL
  Filled 2023-04-27 (×3): qty 2

## 2023-04-27 MED ORDER — ACETAMINOPHEN 325 MG PO TABS: 650.0000 mg | ORAL_TABLET | ORAL | Status: DC | PRN

## 2023-04-27 MED ORDER — DIPHENHYDRAMINE HCL 25 MG PO CAPS: 25.0000 mg | ORAL_CAPSULE | Freq: Four times a day (QID) | ORAL | Status: DC | PRN

## 2023-04-27 MED ORDER — METOCLOPRAMIDE HCL 5 MG/ML IJ SOLN
INTRAMUSCULAR | Status: DC | PRN
Start: 2023-04-27 — End: 2023-04-27
  Administered 2023-04-27: 10 mg via INTRAVENOUS

## 2023-04-27 MED ORDER — CEFAZOLIN SODIUM-DEXTROSE 2-4 GM/100ML-% IV SOLN: 2.0000 g | INTRAVENOUS | Status: DC

## 2023-04-27 MED ORDER — MENTHOL 3 MG MT LOZG: 1.0000 | LOZENGE | OROMUCOSAL | Status: DC | PRN

## 2023-04-27 MED ORDER — SCOPOLAMINE 1 MG/3DAYS TD PT72
1.0000 | MEDICATED_PATCH | Freq: Once | TRANSDERMAL | Status: AC
  Administered 2023-04-27: 1.5 mg via TRANSDERMAL
  Filled 2023-04-27: qty 1

## 2023-04-27 MED ORDER — SODIUM CHLORIDE 0.9 % IV SOLN
500.0000 mg | INTRAVENOUS | Status: AC
  Administered 2023-04-27: 500 mg via INTRAVENOUS

## 2023-04-27 MED ORDER — TRANEXAMIC ACID-NACL 1000-0.7 MG/100ML-% IV SOLN
INTRAVENOUS | Status: AC
  Filled 2023-04-27: qty 100

## 2023-04-27 MED ORDER — ACETAMINOPHEN 500 MG PO TABS
1000.0000 mg | ORAL_TABLET | Freq: Four times a day (QID) | ORAL | Status: DC
  Filled 2023-04-27: qty 2

## 2023-04-27 MED ORDER — FENTANYL CITRATE (PF) 100 MCG/2ML IJ SOLN: 25.0000 ug | INTRAMUSCULAR | Status: DC | PRN

## 2023-04-27 MED ORDER — LACTATED RINGERS IV SOLN: INTRAVENOUS | Status: DC | PRN

## 2023-04-27 MED ORDER — OXYTOCIN-SODIUM CHLORIDE 30-0.9 UT/500ML-% IV SOLN
INTRAVENOUS | Status: DC | PRN
  Administered 2023-04-27: 30 mL via INTRAVENOUS

## 2023-04-27 MED ORDER — LIDOCAINE-EPINEPHRINE (PF) 2 %-1:200000 IJ SOLN
INTRAMUSCULAR | Status: DC | PRN
  Administered 2023-04-27 (×4): 5 mL via EPIDURAL

## 2023-04-27 MED ORDER — ZOLPIDEM TARTRATE 5 MG PO TABS: 5.0000 mg | ORAL_TABLET | Freq: Every evening | ORAL | Status: DC | PRN

## 2023-04-27 MED ORDER — PHENYLEPHRINE 80 MCG/ML (10ML) SYRINGE FOR IV PUSH (FOR BLOOD PRESSURE SUPPORT)
80.0000 ug | PREFILLED_SYRINGE | INTRAVENOUS | Status: DC | PRN
  Filled 2023-04-27: qty 10

## 2023-04-27 MED ORDER — WITCH HAZEL-GLYCERIN EX PADS: 1.0000 | MEDICATED_PAD | CUTANEOUS | Status: DC | PRN

## 2023-04-27 SURGICAL SUPPLY — 38 items
APL PRP STRL LF DISP 70% ISPRP (MISCELLANEOUS) ×2
APL SKNCLS STERI-STRIP NONHPOA (GAUZE/BANDAGES/DRESSINGS) ×2
BENZOIN TINCTURE PRP APPL 2/3 (GAUZE/BANDAGES/DRESSINGS) ×2 IMPLANT
CHLORAPREP W/TINT 26 (MISCELLANEOUS) ×4 IMPLANT
CLAMP UMBILICAL CORD (MISCELLANEOUS) ×2 IMPLANT
CLOTH BEACON ORANGE TIMEOUT ST (SAFETY) ×2 IMPLANT
DRAIN JACKSON PRT FLT 10 (DRAIN) IMPLANT
DRSG OPSITE POSTOP 4X10 (GAUZE/BANDAGES/DRESSINGS) ×2 IMPLANT
ELECT REM PT RETURN 9FT ADLT (ELECTROSURGICAL) ×1
ELECTRODE REM PT RTRN 9FT ADLT (ELECTROSURGICAL) ×2 IMPLANT
EVACUATOR SILICONE 100CC (DRAIN) IMPLANT
EXTRACTOR VACUUM M CUP 4 TUBE (SUCTIONS) IMPLANT
GAUZE SPONGE 4X4 12PLY STRL LF (GAUZE/BANDAGES/DRESSINGS) IMPLANT
GLOVE BIO SURGEON STRL SZ 6.5 (GLOVE) ×2 IMPLANT
GLOVE BIOGEL PI IND STRL 7.0 (GLOVE) ×4 IMPLANT
GOWN STRL REUS W/TWL LRG LVL3 (GOWN DISPOSABLE) ×4 IMPLANT
HEMOSTAT ARISTA ABSORB 3G PWDR (HEMOSTASIS) IMPLANT
KIT ABG SYR 3ML LUER SLIP (SYRINGE) IMPLANT
NDL HYPO 25X5/8 SAFETYGLIDE (NEEDLE) IMPLANT
NEEDLE HYPO 25X5/8 SAFETYGLIDE (NEEDLE) IMPLANT
NS IRRIG 1000ML POUR BTL (IV SOLUTION) ×2 IMPLANT
PACK C SECTION WH (CUSTOM PROCEDURE TRAY) ×2 IMPLANT
PAD OB MATERNITY 4.3X12.25 (PERSONAL CARE ITEMS) ×2 IMPLANT
RTRCTR C-SECT PINK 25CM LRG (MISCELLANEOUS) IMPLANT
STRIP CLOSURE SKIN 1/2X4 (GAUZE/BANDAGES/DRESSINGS) ×2 IMPLANT
SUT CHROMIC 0 CT 1 (SUTURE) ×2 IMPLANT
SUT MNCRL AB 3-0 PS2 27 (SUTURE) ×2 IMPLANT
SUT PLAIN 2 0 (SUTURE) ×2
SUT PLAIN 2 0 XLH (SUTURE) ×2 IMPLANT
SUT PLAIN ABS 2-0 CT1 27XMFL (SUTURE) ×4 IMPLANT
SUT SILK 2 0 SH (SUTURE) IMPLANT
SUT VIC AB 0 CTX 36 (SUTURE) ×4
SUT VIC AB 0 CTX36XBRD ANBCTRL (SUTURE) ×8 IMPLANT
SUT VIC AB 2-0 SH 27 (SUTURE)
SUT VIC AB 2-0 SH 27XBRD (SUTURE) IMPLANT
TOWEL OR 17X24 6PK STRL BLUE (TOWEL DISPOSABLE) ×2 IMPLANT
TRAY FOLEY W/BAG SLVR 14FR LF (SET/KITS/TRAYS/PACK) ×2 IMPLANT
WATER STERILE IRR 1000ML POUR (IV SOLUTION) ×2 IMPLANT

## 2023-04-27 NOTE — Transfer of Care (Signed)
Immediate Anesthesia Transfer of Care Note  Patient: Stacey Key  Procedure(s) Performed: CESAREAN SECTION  Patient Location: PACU  Anesthesia Type:Epidural  Level of Consciousness: awake, alert , and oriented  Airway & Oxygen Therapy: Patient Spontanous Breathing  Post-op Assessment: Report given to RN and Post -op Vital signs reviewed and stable  Post vital signs: Reviewed and stable  Last Vitals:  Vitals Value Taken Time  BP 144/84 04/27/23 1030  Temp    Pulse 114 04/27/23 1032  Resp 14 04/27/23 1032  SpO2 99 % 04/27/23 1032  Vitals shown include unfiled device data.  Last Pain:  Vitals:   04/27/23 0030  TempSrc:   PainSc: 10-Worst pain ever         Complications: No notable events documented.

## 2023-04-27 NOTE — Op Note (Signed)
Op note Cesarean Section Procedure Note   [name]  04/27/2023  Indications: Fetal Distress   Pre-operative Diagnosis: unscheduled cesarean section, primary cesarean section, fetal intolerance of labor.   Post-operative Diagnosis: Same  with thick meconium.    Surgeon: Surgeons and Role:    * Jaymes Graff, MD - Primary   Assistants: Dr Nobie Putnam MD   Anesthesia: epidural   Procedure Details:  The patient was seen in the Holding Room. The risks, benefits, complications, treatment options, and expected outcomes were discussed with the patient. The patient concurred with the proposed plan, giving informed consent. identified as Stacey Key and the procedure verified as C-Section Delivery. A Time Out was held and the above information confirmed.  After induction of anesthesia, the patient was draped and prepped in the usual sterile manner. A transverse incision was made and carried down through the subcutaneous tissue to the fascia. Fascial incision was made in the midline and extended transversely. The fascia was separated from the underlying rectus muscle superiorly and inferiorly. The peritoneum was identified and entered. Peritoneal incision was extended longitudinally with good visualization of bowel and bladder. The utero-vesical peritoneal reflection was incised transversely and the bladder flap was bluntly freed from the lower uterine segment.  An alexsis retractor was placed in the abdomen.   A low transverse uterine incision was made. Delivered from cephalic presentation was a  infant, with Apgar scores of 1 at one minute and 7 at five minutes. Cord ph was  7.32  the umbilical cord was clamped and cut cord blood was obtained for evaluation. The placenta was removed Intact and appeared  with meconium stain . The uterine outline, tubes and ovaries appeared normal}. The uterine incision was closed with running locked sutures of 0Vicryl. A second layer 0 vicrlyl was used to  imbricate the uterine incision.  Several bleeding areas were made hemostatic with figure of 8 suture and Erista    Hemostasis was observed. Lavage was carried out until clear. The alexsis was removed.  The peritoneum was closed with 0 chromic.  The muscles were examined and any bleeders were made hemostatic using bovie cautery device.   The fascia was then reapproximated with running sutures of 0 vicryl.  The subcutaneous tissue was reapproximated  With interrupted stitches using 2-0 plain gut. The subcuticular closure was performed using 3-20monocryl     Instrument, sponge, and needle counts were correct prior the abdominal closure and were correct at the conclusion of the case.    Findings: infant was delivered from vtx presentation. The fluid was meconium stained.  The uterus had one palpable 2 cm fibroid.   tubes and ovaries appeared normal.     Estimated Blood Loss: 1345cc   Total IV Fluids:   Urine Output:  1725CC OF clear urine  Specimens: placenta to pathology  Complications: no complications  Disposition: PACU - hemodynamically stable.   Maternal Condition: stable   Baby condition / location:  NICU  Attending Attestation: I was present and scrubbed for the entire procedure.   Signed: Surgeon(s): Jaymes Graff, MD

## 2023-04-27 NOTE — Progress Notes (Signed)
Subjective:    CNM and RN remained with pt coaching and providing labor support. Pt reports a strong urge to push w/ each contraction, but no change in dilation noted. Pt reports feeling tired and now requests an epidural.   Objective:    VS: BP 139/76   Pulse 68   Temp 98.5 F (36.9 C)   Resp 18   Ht 5\' 8"  (1.727 m)   Wt 93 kg   LMP 07/13/2022   SpO2 100%   BMI 31.17 kg/m  FHR : baseline 145 / variability moderate / accelerations present / variable decelerations Toco: contractions every 3-4 minutes  Membranes: AROM x 6 hrs Dilation: 9 Effacement (%): 90 Cervical Position: Posterior Station: Plus 2 Presentation: Vertex Exam by:: Rhea Pink, CNM   Assessment/Plan:   37 y.o. G1P0 [redacted]w[redacted]d  Labor:  protracted 1st stage, discussed Pitocin after epidural Fetal Wellbeing:   Cat I and periods of Cat II that respond well to interventions, overall re-assuring Pain Control:  Epidural I/D:  GBS pos, adequate treatment w/ PCN Anticipated MOD:  NSVD  Roma Schanz DNP, CNM 04/27/2023 7:17 AM

## 2023-04-27 NOTE — Progress Notes (Signed)
Orthostatic vitals obtained and patient ambulated to bathroom with standby assistance. Perineal care performed and catheter removed. Patient transported to wheelchair and taken to NICU. No urine post catheter removal noted; scant bleeding.

## 2023-04-27 NOTE — Anesthesia Postprocedure Evaluation (Signed)
Anesthesia Post Note  Patient: Stacey Key  Procedure(s) Performed: CESAREAN SECTION     Patient location during evaluation: PACU Anesthesia Type: Epidural Level of consciousness: oriented and awake and alert Pain management: pain level controlled Vital Signs Assessment: post-procedure vital signs reviewed and stable Respiratory status: spontaneous breathing, respiratory function stable and patient connected to nasal cannula oxygen Cardiovascular status: blood pressure returned to baseline and stable Postop Assessment: no headache, no apparent nausea or vomiting, epidural receding and no backache Anesthetic complications: no  No notable events documented.  Last Vitals:  Vitals:   04/27/23 1947 04/27/23 2314  BP: 105/77 110/75  Pulse: 60 (!) 59  Resp: 17 18  Temp: 36.8 C 36.7 C  SpO2: 99% 100%    Last Pain:  Vitals:   04/27/23 2314  TempSrc: Oral  PainSc:    Pain Goal:                   Tien Spooner L Caidan Hubbert

## 2023-04-27 NOTE — Progress Notes (Signed)
Patient ambulated independently in hallway 1 time around unit. Patient attempted to void but was unsuccessful.

## 2023-04-27 NOTE — Anesthesia Procedure Notes (Signed)
Epidural Patient location during procedure: OB Start time: 04/27/2023 6:48 AM End time: 04/27/2023 6:54 AM  Staffing Anesthesiologist: Marcene Duos, MD Performed: anesthesiologist   Preanesthetic Checklist Completed: patient identified, IV checked, site marked, risks and benefits discussed, surgical consent, monitors and equipment checked, pre-op evaluation and timeout performed  Epidural Patient position: sitting Prep: DuraPrep and site prepped and draped Patient monitoring: continuous pulse ox and blood pressure Approach: midline Location: L4-L5 Injection technique: LOR air  Needle:  Needle type: Tuohy  Needle gauge: 17 G Needle length: 9 cm and 9 Needle insertion depth: 7 cm Catheter type: closed end flexible Catheter size: 19 Gauge Catheter at skin depth: 12 cm Test dose: negative  Assessment Events: blood not aspirated, no cerebrospinal fluid, injection not painful, no injection resistance, no paresthesia and negative IV test

## 2023-04-27 NOTE — Progress Notes (Signed)
Patient ID: Fenton Malling, female   DOB: 23-Aug-1986, 37 y.o.   MRN: [id number] Pt comfortable with epidural BP 109/67   Pulse 65   Temp 98.5 F (36.9 C)   Resp 18   Ht 5\' 8"  (1.727 m)   Wt 93 kg   LMP 07/13/2022   SpO2 100%   BMI 31.17 kg/m  FHTS 150s with deep variables to 40s Toco q 2 min 9/100/plus 2 Fetal intolerance to labor.  Pt with different position changes and amnioinfusion.   Continues to have decels  Pt for CS  R&B reviewedd

## 2023-04-27 NOTE — Anesthesia Preprocedure Evaluation (Signed)
Anesthesia Evaluation  Patient identified by MRN, date of birth, ID band Patient awake    Reviewed: Allergy & Precautions, Patient's Chart, lab work & pertinent test results  Airway Mallampati: II  TM Distance: >3 FB     Dental   Pulmonary asthma    Pulmonary exam normal        Cardiovascular negative cardio ROS Normal cardiovascular exam     Neuro/Psych negative neurological ROS     GI/Hepatic Neg liver ROS,GERD  ,,  Endo/Other  negative endocrine ROS    Renal/GU negative Renal ROS     Musculoskeletal  (+) Arthritis ,    Abdominal   Peds  Hematology  (+) Blood dyscrasia, anemia   Anesthesia Other Findings   Reproductive/Obstetrics (+) Pregnancy                             Anesthesia Physical Anesthesia Plan  ASA: 2  Anesthesia Plan: Epidural   Post-op Pain Management:    Induction:   PONV Risk Score and Plan: 2 and Treatment may vary due to age or medical condition  Airway Management Planned: Natural Airway  Additional Equipment:   Intra-op Plan:   Post-operative Plan:   Informed Consent: I have reviewed the patients History and Physical, chart, labs and discussed the procedure including the risks, benefits and alternatives for the proposed anesthesia with the patient or authorized representative who has indicated his/her understanding and acceptance.       Plan Discussed with:   Anesthesia Plan Comments:        Anesthesia Quick Evaluation

## 2023-04-27 NOTE — Progress Notes (Signed)
Subjective:    Coping well with contractions and using nitrous gas PRN. Discussed amniotomy and pt agrees.   Objective:    VS: BP 132/78   Pulse 76   Temp 98.3 F (36.8 C)   Resp 18   Ht 5\' 8"  (1.727 m)   Wt 93 kg   LMP 07/13/2022   BMI 31.17 kg/m  FHR : baseline 150 / variability moderate / accelerations pressent / absent decelerations Toco: contractions every 2-3 minutes  Membranes: AROM, scant, clear Dilation: 7 Effacement (%): 90 Cervical Position: Posterior Station: 0, Plus 1 Presentation: Vertex Exam by:: Rhea Pink, CNM   Assessment/Plan:   37 y.o. G1P0 [redacted]w[redacted]d  Labor: Progressing normally Fetal Wellbeing:  Category I Pain Control:  Nitrous Oxide I/D:   GBS pos PCN x 2 Anticipated MOD:  NSVD  Roma Schanz DNP, CNM 04/27/2023 12:39 AM

## 2023-04-27 NOTE — Progress Notes (Signed)
Reports feeling pressure w/ contractions. Repeat VE, no cervical change.  Position changed for prolonged decels. FHR recovered w/ position change and now Cat 1.   Reviewed pt's birth plan and she declined Pitocin after delivery. Discussed the risk of postpartum hemorrhage with a 41+1 week uterus and addition interventions needed to manage hemorrhage. Questions answered and pt agrees to pitocin after delivery.   Rhea Pink, DNP, CNM 04/27/2023 1:41 AM

## 2023-04-27 NOTE — Lactation Note (Signed)
This note was copied from a baby's chart. Lactation Consultation Note  Patient Name: Stacey Key [id number] Date: 04/27/2023 Age:35 hours  Reason for consult: Initial assessment;NICU baby;Primapara;1st time breastfeeding;Term  P1, [redacted]w[redacted]d, NICU following delivery due to fetal distress  Mother receptive to [facility] visit. She reports baby is doing well in the NICU. She has been able to visit her baby.   Mother is wanting to pump nad provide breast milk for her baby. DEBP was set up, cleaning of equipment and milk storage and collection discussed. Mother will obtain labels for her bottles of milk. Instructed to date and time collection.   Mother was fitted with a 24 mm flange and she is pumping in the initiation mode with comfort. RN will provide coconut oil with next med administration. Instructed mother to contact RN/LC if the flange feels snug or any discomfort.  Instructed to pump 8 times in 24 hours ( every 3 hours) to stimulate milk production.   NICU LC will follow.   Maternal Data Has patient been taught Hand Expression?: Yes Does the patient have breastfeeding experience prior to this delivery?: No       Lactation Tools Discussed/Used Tools: Pump;Flanges Flange Size: 24 Breast pump type: Double-Electric Breast Pump Pump Education: Setup, frequency, and cleaning;Milk Storage Reason for Pumping: baby in NICU Pumping frequency: every 3 hours for 15 min in initiation mode  Interventions Interventions: DEBP;Education;LC Services brochure  Discharge Pump: DEBP;Personal;Hands Free (Motif, Dr. Theora Gianotti(?))  Consult Status Consult Status: Follow-up Date: 04/28/23 Follow-up type: In-patient    [name] 04/27/2023, 6:27 PM

## 2023-04-27 NOTE — Progress Notes (Signed)
Patient ambulated to bathroom and attempted to void but was unsuccessful. RN educated patient regarding the importance of voiding by 2000 today. Patient states she will try one more time prior to intervention.

## 2023-04-28 LAB — CBC
HCT: 26.2 % — ABNORMAL LOW (ref 36.0–46.0)
Hemoglobin: 8.3 g/dL — ABNORMAL LOW (ref 12.0–15.0)
MCH: 26.9 pg (ref 26.0–34.0)
MCHC: 31.7 g/dL (ref 30.0–36.0)
MCV: 84.8 fL (ref 80.0–100.0)
Platelets: 157 10*3/uL (ref 150–400)
RBC: 3.09 MIL/uL — ABNORMAL LOW (ref 3.87–5.11)
RDW: 14.7 % (ref 11.5–15.5)
WBC: 16 10*3/uL — ABNORMAL HIGH (ref 4.0–10.5)
nRBC: 0 % (ref 0.0–0.2)

## 2023-04-28 MED ORDER — DOCUSATE SODIUM 100 MG PO CAPS
100.0000 mg | ORAL_CAPSULE | Freq: Two times a day (BID) | ORAL | Status: DC
  Administered 2023-04-28 – 2023-04-30 (×2): 100 mg via ORAL
  Filled 2023-04-28 (×4): qty 1

## 2023-04-28 MED ORDER — LIDOCAINE 5 % EX PTCH
1.0000 | MEDICATED_PATCH | CUTANEOUS | Status: DC
  Administered 2023-04-28 – 2023-04-29 (×2): 1 via TRANSDERMAL
  Filled 2023-04-28 (×4): qty 1

## 2023-04-28 MED ORDER — FERROUS SULFATE 325 (65 FE) MG PO TABS
325.0000 mg | ORAL_TABLET | Freq: Two times a day (BID) | ORAL | Status: DC
  Administered 2023-04-28 – 2023-04-30 (×4): 325 mg via ORAL
  Filled 2023-04-28 (×4): qty 1

## 2023-04-28 NOTE — Progress Notes (Signed)
Subjective: Postpartum Day 1: Cesarean Delivery Patient reports tolerating PO, + flatus, + BM and no problems voiding.    Objective: Vital signs in last 24 hours: Temp:  [98 F (36.7 C)-98.8 F (37.1 C)] 98.2 F (36.8 C) (07/28 1133) Pulse Rate:  [59-77] 77 (07/28 1133) Resp:  [17-18] 18 (07/28 1133) BP: (104-121)/(68-77) 104/70 (07/28 1133) SpO2:  [99 %-100 %] 100 % (07/28 1133)  Physical Exam:  General: alert Lochia: appropriate Uterine Fundus: firm Incision: healing well, no significant drainage DVT Evaluation: No evidence of DVT seen on physical exam.  Recent Labs    04/26/23 1605 04/28/23 0412  HGB 11.5* 8.3*  HCT 37.2 26.2*    Assessment/Plan: Status post Cesarean section. Doing well postoperatively.  Continue current care. Lidocaine patch pt has some burning on the right side Iron and colace Plan circ when infant comes out of NICU  Stacey Key A Nashaly Dorantes 04/28/2023, 2:29 PM

## 2023-04-28 NOTE — Lactation Note (Signed)
This note was copied from a baby's chart.  NICU Lactation Consultation Note  Patient Name: Stacey Key [id number] Date: 04/28/2023 Age:35 hours  Reason for consult: Follow-up assessment; Primapara; 1st time breastfeeding; NICU baby; Term; Other (Comment) ([employer] employee)  SUBJECTIVE Visited with family of 40 hours old FT NICU female; [name] is a P1 and reports she's been pumping consistently, she's getting enough volume to collect and bring it to baby "Stacey Key" in the NICU, praised her for her efforts. She's a [employer] employee and inquired about a pump, provided the employee pump form, she'll look it up and let lactation know once she decides on one; she won't be discharged at least until tomorrow. Her plan is do to both, direct breastfeeding along with pumping and bottle feeding, she agreed to use formula in the meantime until her milk comes in. Reviewed pumping schedule, lactogenesis II and anticipatory guidelines.   OBJECTIVE Infant data: Mother's Current Feeding Choice: Breast Milk and Formula  Infant feeding assessment Scale for Readiness: 3   Maternal data: G1P0 C-Section, Vacuum Assisted Has patient been taught Hand Expression?: Yes Hand Expression Comments: glistening of colostrum expressed from right breast Current breast feeding challenges:: NICU admission Does the patient have breastfeeding experience prior to this delivery?: No Pumping frequency: 8 times/24 hours Pumped volume: 1 mL (1-2 ml) Flange Size: 24 Risk factor for low milk supply:: primipara, infant separation  Pump: DEBP, Personal, Hands Free (Motif, Dr. Theora Gianotti(?))  ASSESSMENT Infant: Feeding Status: Scheduled 9-12-3-6  Maternal: Milk volume: Normal  INTERVENTIONS/PLAN Interventions: Interventions: Breast feeding basics reviewed; Coconut oil; Education; DEBP Tools: Pump; Flanges; Coconut oil Pump Education: Setup, frequency, and cleaning; Milk Storage  Plan: Encouraged to continue pumping every 3  hours, ideally 8 pumping sessions/24 hours Breast massage, hand expression and coconut oil were also encouraged prior pumping She'll call out for lactation once she's ready to pick her employee pump  FOB present and supportive. All questions and concerns answered, family to contact [facility] services PRN.  Consult Status: NICU follow-up NICU Follow-up type: Maternal D/C visit   [name] 04/28/2023, 6:34 PM

## 2023-04-29 MED ORDER — IBUPROFEN 100 MG/5ML PO SUSP
ORAL | Status: AC
  Filled 2023-04-29: qty 10

## 2023-04-29 MED ORDER — FAMOTIDINE 20 MG PO TABS
20.0000 mg | ORAL_TABLET | Freq: Two times a day (BID) | ORAL | Status: DC
  Administered 2023-04-29 – 2023-04-30 (×3): 20 mg via ORAL
  Filled 2023-04-29 (×3): qty 1

## 2023-04-29 NOTE — Lactation Note (Signed)
This note was copied from a baby's chart.  NICU Lactation Consultation Note  Patient Name: Stacey Key [id number] Date: 04/29/2023 Age:35 hours  Reason for consult: Follow-up assessment; NICU baby; Primapara; 1st time breastfeeding; Term  SUBJECTIVE  LC in to visit with P1 Mom of baby "Stacey Key" in the NICU receiving respiratory support for mild meconium aspiration.  Baby has been weaned to 2 L/min HFNC at 21% O2.  Mom has been consistently pumping every 3 hrs, still seeing drops of colostrum.  Reassured Mom that this was normal and to continue her pumping and breast massage and hand expression.  Encouraged STS with baby when she is in his room.  Mom states she has held baby, but not STS.  Talked about the benefits of this regarding her milk producing hormones.  Employee pump provided with receipt.  Engorgement prevention and treatment reviewed.  Mom unsure if being discharged today.  Mom aware of DEBP in baby's room and demonstrated how to remove all the pump parts and take to baby's room.  Reviewed washing process and drying of pump parts.  Mom aware of lactation support in NICU.  Encouraged asking for LC prn as Mom would like to breastfeed as first PO feeding.  OBJECTIVE Infant data: Mother's Current Feeding Choice: Breast Milk and Formula  Infant feeding assessment Scale for Readiness: 3   Maternal data: G1P0 C-Section, Vacuum Assisted Current breast feeding challenges:: NICU admission Pumping frequency: 8 times per 24 hrs Pumped volume: 1 mL Flange Size: 24 Risk factor for low milk supply:: primipara, infant separation  WIC Program: No WIC Referral Sent?: No Pump: Employee Pump Gaffer DEBP)  ASSESSMENT Infant: Feeding Status: Scheduled 9-12-3-6  Maternal: Milk volume: Normal  INTERVENTIONS/PLAN Interventions: Interventions: Breast feeding basics reviewed; Skin to skin; Breast massage; Hand express; DEBP; Education Discharge Education: Engorgement and breast  care Tools: Pump; Flanges Pump Education: Setup, frequency, and cleaning; Milk Storage  Plan: Consult Status: NICU follow-up NICU Follow-up type: Verify absence of engorgement; Verify onset of copious milk; Maternal D/C visit   [name] 04/29/2023, 9:19 AM

## 2023-04-29 NOTE — Plan of Care (Signed)

## 2023-04-29 NOTE — Progress Notes (Signed)
Subjective: Postpartum Day 2: Cesarean Delivery Patient reports tolerating PO, + flatus, + BM, and no problems voiding.  Ambulating without difficulty.  Pt says the baby is progressing well and she will be going up later today to see how well the baby can latch on for feedings.  Objective: Vital signs in last 24 hours: Temp:  [98.2 F (36.8 C)-98.6 F (37 C)] 98.5 F (36.9 C) (07/29 1207) Pulse Rate:  [65-86] 65 (07/29 1207) Resp:  [16-18] 18 (07/29 1207) BP: (107-127)/(70-73) 112/73 (07/29 1207) SpO2:  [99 %-100 %] 99 % (07/29 1207)  Physical Exam:  General: alert and no distress Lochia: appropriate Uterine Fundus: FF, NT Incision: dressing c/d/i DVT Evaluation: no calf tenderness and SCDs are on  Recent Labs    04/26/23 1605 04/28/23 0412  HGB 11.5* 8.3*  HCT 37.2 26.2*    Assessment/Plan: Status post Cesarean section. Doing well postoperatively.  Continue current care. Cont routine PP care Purcell Nails, MD 04/29/2023, 2:12 PM

## 2023-04-30 DIAGNOSIS — O99892 Other specified diseases and conditions complicating childbirth: Secondary | ICD-10-CM | POA: Insufficient documentation

## 2023-04-30 LAB — SURGICAL PATHOLOGY

## 2023-04-30 MED ORDER — IBUPROFEN 600 MG PO TABS: 600.0000 mg | ORAL_TABLET | Freq: Four times a day (QID) | ORAL | 0 refills | Status: DC | PRN

## 2023-04-30 MED ORDER — FERROUS SULFATE 325 (65 FE) MG PO TABS: 325.0000 mg | ORAL_TABLET | Freq: Every day | ORAL | 3 refills | Status: DC

## 2023-04-30 MED ORDER — DOCUSATE SODIUM 100 MG PO CAPS: 100.0000 mg | ORAL_CAPSULE | Freq: Two times a day (BID) | ORAL | 0 refills | Status: DC

## 2023-04-30 MED ORDER — OXYCODONE HCL 5 MG PO TABS: 5.0000 mg | ORAL_TABLET | ORAL | 0 refills | Status: DC | PRN

## 2023-04-30 NOTE — Progress Notes (Signed)
Patient screened out for psychosocial assessment since none of the following apply: Psychosocial stressors documented in mother or baby's chart Gestation less than 32 weeks Code at delivery  Infant with anomalies Please contact the Clinical Social Worker if specific needs arise or by MOB's request.   MOB's Edinburgh Postpartum Depression Score is 5.  Blaine Hamper, MSW, LCSW Clinical Social Work [phone]

## 2023-04-30 NOTE — Discharge Summary (Signed)
Postpartum Discharge Summary  Date of Service updated 04/30/2023     Patient Name: Stacey Key DOB: [birth year] MRN: [id number]  Date of admission: 04/26/2023 Delivery date:04/27/2023 Delivering provider: Jaymes Graff Date of discharge: 04/30/2023  Admitting diagnosis: Normal labor and delivery [O80] Post-dates pregnancy [O48.0] Intrauterine pregnancy: [redacted]w[redacted]d     Secondary diagnosis:  Active Problems:   Moderate persistent asthma   Uterine fibroid   Delivery by emergency cesarean section  Additional problems: None    Discharge diagnosis: Term Pregnancy Delivered                                              Post partum procedures: none Augmentation: AROM Complications: None  Hospital course: Onset of Labor With Unplanned C/S   37 y.o. yo G1P0 at [redacted]w[redacted]d was admitted in Latent Labor on 04/26/2023. Patient had a labor course significant for NRFHT. The patient went for cesarean section due to Non-Reassuring FHR. Delivery details as follows: Membrane Rupture Time/Date: 12:35 AM,04/27/2023  Delivery Method:C-Section, Vacuum Assisted Details of operation can be found in separate operative note. Patient had a postpartum course complicated by none.  She is ambulating,tolerating a regular diet, passing flatus, and urinating well.  Patient is discharged home in stable condition 04/30/23.  Newborn Data: Birth date:04/27/2023 Birth time:9:27 AM Gender:Female Living status:  Apgars:1 ,2  Weight:4130 g  Magnesium Sulfate received: No BMZ received: No Rhophylac:N/A MMR:N/A T-DaP:Given prenatally Flu: N/A Transfusion:No  Physical exam  Vitals:   04/29/23 1207 04/29/23 1611 04/29/23 2026 04/30/23 0801  BP: 112/73 116/74 119/71 120/79  Pulse: 65 75 83 74  Resp: 18 17 16 16   Temp: 98.5 F (36.9 C) 98.4 F (36.9 C) 98.5 F (36.9 C) 98.4 F (36.9 C)  TempSrc: Oral Oral Oral Oral  SpO2: 99% 98% 100% 100%  Weight:      Height:       General: alert, cooperative, and no  distress Lochia: appropriate Uterine Fundus: firm Incision: Healing well with no significant drainage, No significant erythema, Dressing is clean, dry, and intact DVT Evaluation: No evidence of DVT seen on physical exam. Calf/Ankle edema BLE +1 is present  Labs: Lab Results  Component Value Date   WBC 16.0 (H) 04/28/2023   HGB 8.3 (L) 04/28/2023   HCT 26.2 (L) 04/28/2023   MCV 84.8 04/28/2023   PLT 157 04/28/2023      Latest Ref Rng & Units 02/22/2022   11:14 AM  CMP  Glucose 70 - 99 mg/dL 86   BUN 6 - 20 mg/dL 5   Creatinine 4.09 - 8.11 mg/dL 9.14   Sodium 782 - 956 mmol/L 142   Potassium 3.5 - 5.2 mmol/L 4.6   Chloride 96 - 106 mmol/L 103   CO2 20 - 29 mmol/L 25   Calcium 8.7 - 10.2 mg/dL 9.5   Total Protein 6.0 - 8.5 g/dL 7.9   Total Bilirubin 0.0 - 1.2 mg/dL 0.3   Alkaline Phos 44 - 121 IU/L 58   AST 0 - 40 IU/L 15   ALT 0 - 32 IU/L 9    Edinburgh Score:    04/27/2023   12:00 PM  Edinburgh Postnatal Depression Scale Screening Tool  I have been able to laugh and see the funny side of things. 0  I have looked forward with enjoyment to things. 0  I have blamed  myself unnecessarily when things went wrong. 1  I have been anxious or worried for no good reason. 1  I have felt scared or panicky for no good reason. 2  Things have been getting on top of me. 1  I have been so unhappy that I have had difficulty sleeping. 0  I have felt sad or miserable. 0  I have been so unhappy that I have been crying. 0  The thought of harming myself has occurred to me. 0  Edinburgh Postnatal Depression Scale Total 5      After visit meds:  Allergies as of 04/30/2023   No Known Allergies      Medication List     STOP taking these medications    meclizine 25 MG tablet Commonly known as: ANTIVERT       TAKE these medications    albuterol 108 (90 Base) MCG/ACT inhaler Commonly known as: VENTOLIN HFA Inhale 2 puffs into the lungs every 6 (six) hours as needed for wheezing  or shortness of breath. What changed: Another medication with the same name was removed. Continue taking this medication, and follow the directions you see here.   cetirizine 10 MG tablet Commonly known as: ZYRTEC Take 1 tablet (10 mg total) by mouth daily.   docusate sodium 100 MG capsule Commonly known as: COLACE Take 1 capsule (100 mg total) by mouth 2 (two) times daily.   ibuprofen 600 MG tablet Commonly known as: ADVIL Take 1 tablet (600 mg total) by mouth every 6 (six) hours as needed.   omeprazole 20 MG capsule Commonly known as: PRILOSEC Take 1 capsule (20 mg total) by mouth daily.   ondansetron 4 MG disintegrating tablet Commonly known as: ZOFRAN-ODT Take 1 tablet (4 mg total) by mouth every 8 (eight) hours as needed for nausea or vomiting.   oxyCODONE 5 MG immediate release tablet Commonly known as: Oxy IR/ROXICODONE Take 1 tablet (5 mg total) by mouth every 4 (four) hours as needed for moderate pain.   prenatal multivitamin Tabs tablet Take 1 tablet by mouth daily at 12 noon.   Vitamin D (Ergocalciferol) 1.25 MG (50000 UNIT) Caps capsule Commonly known as: DRISDOL Take 1 capsule (50,000 Units total) by mouth every 7 (seven) days.         Discharge home in stable condition Infant Feeding: Bottle and pt. Pumping as infant in NICU Infant Disposition:NICU Discharge instruction: per After Visit Summary and Postpartum booklet. Activity: Advance as tolerated. Pelvic rest for 6 weeks.  Diet: routine diet Anticipated Birth Control: Unsure Postpartum Appointment:6 weeks Additional Postpartum F/U: Incision check 1 week Future Appointments:No future appointments. Follow up Visit:  Follow-up Information     [facility] Follow up in 6 week(s).   Specialty: Obstetrics and Gynecology Contact information: [address] [place] [address] [phone]        Jaymes Graff, MD. Schedule an  appointment as soon as possible for a visit in 1 week(s).   Specialty: Obstetrics and Gynecology Why: Incision check Contact information: [address] [place] [address] [phone]                 Delivery Summary for Shandora L Schmuhl  Labor Events:   Preterm labor: No data found  Rupture date: 04/27/2023  Rupture time: 12:35 AM  Rupture type: Artificial  Fluid Color: Clear  Induction: No data found  Augmentation: No data found  Complications: No data found  Cervical ripening: No  data found No data found   No data found     Delivery:   Episiotomy: No data found  Lacerations: No data found  Repair suture: No data found  Repair # of packets: No data found  Blood loss (ml): No data found   Information for the patient's newborn:  [place], [place] [[id number]]   Delivery 04/27/2023 9:27 AM by  C-Section, Vacuum Assisted Sex:  female Gestational Age: [redacted]w[redacted]d Delivery Clinician:   Living?:         APGARS  One minute Five minutes Ten minutes  Skin color:        Heart rate:        Grimace:        Muscle tone:        Breathing:        Totals: 1  2  7     Presentation/position:      Resuscitation:   Cord information:    Disposition of cord blood:     Blood gases sent?  Complications:   Placenta: Delivered:       appearance Newborn Measurements: Weight: 9 lb 1.7 oz (4130 g)  Height: 19.69"  Head circumference:    Chest circumference:    Other providers:    Additional  information: Forceps:   Vacuum:   Breech:   Observed anomalies           04/30/2023 Jackie Plum, MD

## 2023-05-01 ENCOUNTER — Ambulatory Visit (HOSPITAL_COMMUNITY): Payer: Self-pay

## 2023-05-01 NOTE — Lactation Note (Signed)
This note was copied from a baby's chart.  NICU Lactation Consultation Note  Patient Name: Stacey Key [id number] Date: 05/01/2023 Age:35 days  Reason for consult: Follow-up assessment; NICU baby; Primapara; 1st time breastfeeding; Term; RN request  SUBJECTIVE  Baby's RN called LC regarding Mom's breast and concern for engorgement.   Mom reports pumping every 3 hrs, including through the night.  Last pumping was 40 min ago for 35 ml.  Both breasts are filling and compressible to touch.   Engorgement prevention and treatment reviewed.  Assisted with baby in football hold on right breast.   Baby able to attain and sustain a deep areolar latch after a couple attempts. LC assisting Mom and teaching her the importance of breast support and firm head support.  FOB shown how to assess baby's lower lip and gape of mouth on the breast. Baby easily opened his mouth wider with a gentle chin tug.   Baby sucking with regular deep jaw extension and swallows identified. Baby fed for 13 mins on right breast.  Showed Mom how to break suction and nipple rounded. Baby burped and placed on left breast in football hold.   With little guidance, Mom able to latch baby deeply and baby noted to be nutritive.  Mom taught to use alternate breast compression to increase milk transfer.  Encouraged STS with baby and offering the breast often with cues.   RN notified and no gavage feeding as baby fed for 25 mins total.  OBJECTIVE Infant data: No data recorded Infant feeding assessment Scale for Readiness: 2 Scale for Quality: 2   Maternal data: G1P0 C-Section, Vacuum Assisted Pumping frequency: 8 times per 24 hrs Pumped volume: 35 mL Flange Size: 24  WIC Program: No WIC Referral Sent?: No Pump: Employee Pump ([employer] DEBP)  ASSESSMENT Infant: LATCH Documentation Latch: 2 Audible Swallowing: 2 Type of Nipple: 2 Comfort (Breast/Nipple): 1 (Breasts full, not engorged) Hold (Positioning): 1 LATCH  Score: 8  Feeding Status: Scheduled 9-12-3-6  Maternal: Milk volume: Normal  INTERVENTIONS/PLAN Interventions: Interventions: Breast feeding basics reviewed; Assisted with latch; Skin to skin; Breast massage; Hand express; Breast compression; Adjust position; Support pillows; Position options; Expressed milk; DEBP; Education Discharge Education: Engorgement and breast care Tools: Pump; Flanges; Bottle; Hands-free pumping top Pump Education: Setup, frequency, and cleaning; Milk Storage  Plan: Consult Status: NICU follow-up NICU Follow-up type: Assist with IDF-2 (Mother does not need to pre-pump before breastfeeding); Verify absence of engorgement; Verify onset of copious milk   [name] 05/01/2023, 9:32 AM

## 2023-05-02 ENCOUNTER — Ambulatory Visit (HOSPITAL_COMMUNITY): Payer: Self-pay

## 2023-05-02 NOTE — Lactation Note (Signed)
This note was copied from a baby's chart.  NICU Lactation Consultation Note  Patient Name: Stacey Key [id number] Date: 05/02/2023 Age:35 days  Reason for consult: Weekly NICU follow-up; Primapara; 1st time breastfeeding; NICU baby; Term; Other (Comment) ([employer] employee)  SUBJECTIVE Visited with family of 71 days old FT NICU female; Stacey Key is a P1 and reports she started taking baby to breast yesterday,  her milk came in, praised her for her efforts. Noticed that she hasn't been pumping consistently. Explained the importance of consistent pumping for the prevention of engorgement, provided ice packs, her breast felt tender and overly full (see maternal assessment). Parents are taking baby 'Stacey Key" home today. Reviewed discharge education and the importance of consistent  pumping after feedings at the breast to protect her supply. Encouraged to continue putting baby to breast +8 times/24 hours and to pump afterwards. Referral to [facility]. sent for [facility] OP F/U. FOB present. All questions and concerns answered, family to contact [facility] services PRN.          OBJECTIVE Infant data: Mother's Current Feeding Choice: Breast Milk  Infant feeding assessment Scale for Readiness: 1 Scale for Quality: 1   Maternal data: G1P0 C-Section, Vacuum Assisted Pumping frequency: 2 times/24 hour; but she's been also taking baby to breast Pumped volume: 70 mL (70-75 ml) Flange Size: 24  WIC Program: No WIC Referral Sent?: No Pump: Employee Pump ([employer] DEBP)  ASSESSMENT Infant: LATCH Documentation Latch: 2 Audible Swallowing: 2 Type of Nipple: 2 Comfort (Breast/Nipple): 2 Hold (Positioning): 2 LATCH Score: 10  Feeding Status: Ad lib  Maternal: Milk volume: Normal Breast are full but not engorged yet, TTT and TTP  INTERVENTIONS/PLAN Interventions: Interventions: Breast feeding basics reviewed; Coconut oil; Breast massage; Reverse pressure; DEBP; Ice; Education Discharge Education: Engorgement  and breast care; Outpatient recommendation; Outpatient Epic message sent Tools: Pump; Flanges; Hands-free pumping top; Coconut oil Pump Education: Setup, frequency, and cleaning; Milk Storage  Plan: Consult Status: Complete NICU Follow-up type: Assist with IDF-2 (Mother does not need to pre-pump before breastfeeding); Verify absence of engorgement; Verify onset of copious milk   Koal Eslinger S Ayse Mccartin 05/02/2023, 4:25 PM

## 2023-05-06 ENCOUNTER — Ambulatory Visit (INDEPENDENT_AMBULATORY_CARE_PROVIDER_SITE_OTHER): Payer: Commercial Managed Care - PPO | Admitting: Nurse Practitioner

## 2023-05-06 VITALS — BP 100/70 | HR 100 | Temp 98.5°F | Ht 68.0 in | Wt 182.8 lb

## 2023-05-06 DIAGNOSIS — R0981 Nasal congestion: Secondary | ICD-10-CM | POA: Insufficient documentation

## 2023-05-06 DIAGNOSIS — R0989 Other specified symptoms and signs involving the circulatory and respiratory systems: Secondary | ICD-10-CM | POA: Insufficient documentation

## 2023-05-06 LAB — POC SOFIA 2 FLU + SARS ANTIGEN FIA
Influenza A, POC: NEGATIVE
Influenza B, POC: NEGATIVE
SARS Coronavirus 2 Ag: NEGATIVE

## 2023-05-06 MED ORDER — FLUTICASONE PROPIONATE 50 MCG/ACT NA SUSP
2.0000 | Freq: Every day | NASAL | 2 refills | Status: DC
Start: 2023-05-06 — End: 2023-12-20

## 2023-05-06 NOTE — Assessment & Plan Note (Signed)
No chest congestion on auscultation. Continue with symptom management with medications approved for lactating mothers. Advised any antihistamine will increase her risk for her milk to dry up

## 2023-05-06 NOTE — Progress Notes (Addendum)
Madelaine Bhat, CMA,acting as a Neurosurgeon for Arnette Felts, FNP.,have documented all relevant documentation on the behalf of Arnette Felts, FNP,as directed by  Arnette Felts, FNP while in the presence of Arnette Felts, FNP.  Subjective:  Patient ID: Stacey Key , female    DOB: [birth year] , 37 y.o.   MRN: [id number]  Chief Complaint  Patient presents with   URI    HPI  Patient presents today for coughing up mucus, fever, and chest congestion. Patient reports her symptoms started over the weekend. Felt like something stuck in her throat. Took medication for acid reflux. Had a low grade fever last night. She will use flonase but has not used in a while. Patient reports she is breastfeeding, patient is seeing her OBGYN today as well for her incision check.  Patient reports compliance with medications. Patient denies any headaches, SOB, or chest pain.   BP Readings from Last 3 Encounters: 05/06/23 : 100/70 04/30/23 : 120/79 02/20/23 : (!) 98/55   .     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   Cough 01/14/2020   Dysmenorrhea 08/22/2016   Endometriosis    GERD (gastroesophageal reflux disease)    GERD (gastroesophageal reflux disease) 11/13/2011   History of asthma 01/14/2020   History of COVID-19 01/14/2020   Ovarian cyst    Ovarian cyst, complex 05/05/2012   Left complex 5x 3cm ovarian cyst   Seasonal allergic rhinitis 08/16/2017   Vitamin D deficiency    Wheezing 01/14/2020     Family History  Problem Relation Age of Onset   Anemia Mother    GER disease Mother    Hypertension Father    Hypertension Maternal Grandmother    Breast cancer Maternal Grandmother 50       over 61   Rheum arthritis Maternal Grandmother    Hypertension Maternal Grandfather    Clotting disorder Maternal Grandfather    Skin cancer Maternal  Grandfather    Breast cancer Paternal Grandmother    Hypertension Paternal Grandfather    Lung cancer Paternal Grandfather    Asthma Maternal Aunt    Other Neg Hx      Current Outpatient Medications:    albuterol (VENTOLIN HFA) 108 (90 Base) MCG/ACT inhaler, Inhale 2 puffs into the lungs every 6 (six) hours as needed for wheezing or shortness of breath., Disp: 8 g, Rfl: 2   cetirizine (ZYRTEC) 10 MG tablet, Take 1 tablet (10 mg total) by mouth daily., Disp: 30 tablet, Rfl: 11   docusate sodium (COLACE) 100 MG capsule, Take 1 capsule (100 mg total) by mouth 2 (two) times daily., Disp: 10 capsule, Rfl: 0   ferrous sulfate 325 (65 FE) MG tablet, Take 1 tablet (325 mg total) by mouth daily with breakfast., Disp: 90 tablet, Rfl: 3   fluticasone (FLONASE) 50 MCG/ACT nasal spray, Place 2 sprays into both nostrils daily., Disp: 16 g, Rfl: 2   ibuprofen (ADVIL) 600 MG tablet, Take 1 tablet (600 mg total) by mouth every 6 (six) hours as needed., Disp: 30 tablet, Rfl: 0   ondansetron (ZOFRAN-ODT) 4 MG disintegrating tablet, Take 1 tablet (4 mg total) by mouth every 8 (eight) hours as needed for nausea or vomiting., Disp: 20 tablet, Rfl: 0   oxyCODONE (OXY IR/ROXICODONE) 5 MG immediate release tablet,  Take 1 tablet (5 mg total) by mouth every 4 (four) hours as needed for moderate pain., Disp: 30 tablet, Rfl: 0   Prenatal Vit-Fe Fumarate-FA (PRENATAL MULTIVITAMIN) TABS tablet, Take 1 tablet by mouth daily at 12 noon., Disp: , Rfl:    Vitamin D, Ergocalciferol, (DRISDOL) 1.25 MG (50000 UNIT) CAPS capsule, Take 1 capsule (50,000 Units total) by mouth every 7 (seven) days., Disp: 12 capsule, Rfl: 1   omeprazole (PRILOSEC) 20 MG capsule, Take 1 capsule (20 mg total) by mouth daily. (Patient not taking: Reported on 02/22/2022), Disp: 30 capsule, Rfl: 0   No Known Allergies   Review of Systems  Constitutional: Negative.   Respiratory: Negative.  Negative for cough (dry occasional mucous production).    Cardiovascular: Negative.   Neurological:  Positive for headaches (slight).  Psychiatric/Behavioral: Negative.       Today's Vitals   05/06/23 1137  BP: 100/70  Pulse: 100  Temp: 98.5 F (36.9 C)  TempSrc: Oral  Weight: 182 lb 12.8 oz (82.9 kg)  Height: 5\' 8"  (1.727 m)  PainSc: 0-No pain   Body mass index is 27.79 kg/m.  Wt Readings from Last 3 Encounters:  05/06/23 182 lb 12.8 oz (82.9 kg)  04/26/23 205 lb (93 kg)  08/07/22 180 lb 3.2 oz (81.7 kg)     Physical Exam Vitals reviewed.  Constitutional:      Appearance: Normal appearance. She is well-developed.  HENT:     Head: Normocephalic and atraumatic.  Eyes:     Pupils: Pupils are equal, round, and reactive to light.  Cardiovascular:     Rate and Rhythm: Normal rate and regular rhythm.     Pulses: Normal pulses.     Heart sounds: Normal heart sounds. No murmur heard. Pulmonary:     Effort: Pulmonary effort is normal.     Breath sounds: Normal breath sounds.  Musculoskeletal:        General: Normal range of motion.  Skin:    General: Skin is warm and dry.     Capillary Refill: Capillary refill takes less than 2 seconds.  Neurological:     General: No focal deficit present.     Mental Status: She is alert and oriented to person, place, and time.     Cranial Nerves: No cranial nerve deficit.  Psychiatric:        Mood and Affect: Mood normal.         Assessment And Plan:  Chest congestion Assessment & Plan: No chest congestion on auscultation. Continue with symptom management with medications approved for lactating mothers. Advised any antihistamine will increase her risk for her milk to dry up  Orders: -     POC SOFIA 2 FLU + SARS ANTIGEN FIA  Nasal congestion Assessment & Plan: She can use a flonase nasal spray as needed. Also given nasal rinse to use at home. Negative rapid covid and flu.   Orders: -     Fluticasone Propionate; Place 2 sprays into both nostrils daily.  Dispense: 16 g; Refill:  2   Return for 8 week HM or at least by end of year.  Patient was given opportunity to ask questions. Patient verbalized understanding of the plan and was able to repeat key elements of the plan. All questions were answered to their satisfaction.    Jeanell Sparrow, FNP, have reviewed all documentation for this visit. The documentation on 05/06/23 for the exam, diagnosis, procedures, and orders are all accurate and complete.   IF  YOU HAVE BEEN REFERRED TO A SPECIALIST, IT MAY TAKE 1-2 WEEKS TO SCHEDULE/PROCESS THE REFERRAL. IF YOU HAVE NOT HEARD FROM US/SPECIALIST IN TWO WEEKS, PLEASE GIVE [place] A CALL AT [phone].

## 2023-05-06 NOTE — Assessment & Plan Note (Addendum)
She can use a flonase nasal spray as needed. Also given nasal rinse to use at home. Negative rapid covid and flu.

## 2023-05-06 NOTE — Patient Instructions (Signed)
Only use flonase as needed for your nasal congestion.  If symptoms worsen recheck your covid with a home test.  I have provided information on sinus rinses.

## 2023-07-04 DIAGNOSIS — Z4801 Encounter for change or removal of surgical wound dressing: Secondary | ICD-10-CM | POA: Diagnosis not present

## 2023-07-25 ENCOUNTER — Encounter: Payer: Self-pay | Admitting: Nurse Practitioner

## 2023-07-25 ENCOUNTER — Ambulatory Visit (INDEPENDENT_AMBULATORY_CARE_PROVIDER_SITE_OTHER): Payer: Commercial Managed Care - PPO | Admitting: Nurse Practitioner

## 2023-07-25 VITALS — BP 110/60 | HR 93 | Temp 98.4°F | Ht 68.0 in | Wt 168.8 lb

## 2023-07-25 DIAGNOSIS — Z2821 Immunization not carried out because of patient refusal: Secondary | ICD-10-CM | POA: Diagnosis not present

## 2023-07-25 DIAGNOSIS — E559 Vitamin D deficiency, unspecified: Secondary | ICD-10-CM

## 2023-07-25 DIAGNOSIS — M546 Pain in thoracic spine: Secondary | ICD-10-CM | POA: Diagnosis not present

## 2023-07-25 DIAGNOSIS — Z0000 Encounter for general adult medical examination without abnormal findings: Secondary | ICD-10-CM | POA: Insufficient documentation

## 2023-07-25 DIAGNOSIS — Z23 Encounter for immunization: Secondary | ICD-10-CM | POA: Diagnosis not present

## 2023-07-25 DIAGNOSIS — E041 Nontoxic single thyroid nodule: Secondary | ICD-10-CM

## 2023-07-25 DIAGNOSIS — G43009 Migraine without aura, not intractable, without status migrainosus: Secondary | ICD-10-CM

## 2023-07-25 DIAGNOSIS — G8929 Other chronic pain: Secondary | ICD-10-CM | POA: Diagnosis not present

## 2023-07-25 MED ORDER — HPV 9-VALENT RECOMB VACCINE IM SUSY
0.5000 mL | PREFILLED_SYRINGE | Freq: Once | INTRAMUSCULAR | 0 refills | Status: AC
Start: 2023-07-25 — End: 2023-07-25

## 2023-07-25 MED ORDER — MAGNESIUM 250 MG PO TABS: 1.0000 | ORAL_TABLET | Freq: Every day | ORAL | 1 refills | Status: DC | PRN

## 2023-07-25 NOTE — Assessment & Plan Note (Signed)
Rx sent to pharmacy for HPV

## 2023-07-25 NOTE — Patient Instructions (Signed)
You can go to [facility] for your xray at [address] You can take magnesium as needed for the headache  Also if that is not effective we can consider a referral for laser treatment or dry needling

## 2023-07-25 NOTE — Assessment & Plan Note (Signed)
She has some tenderness to her right mid thoracic back area. She also has some decreased sensation.

## 2023-07-25 NOTE — Assessment & Plan Note (Signed)

## 2023-07-25 NOTE — Assessment & Plan Note (Signed)

## 2023-07-25 NOTE — Assessment & Plan Note (Signed)
She is to take magnesium supplement since she is breast feeding. She may also consider dry needling or laser treatment.

## 2023-07-25 NOTE — Assessment & Plan Note (Signed)
Behavior modifications discussed and diet history reviewed.   Pt will continue to exercise regularly and modify diet with low GI, plant based foods and decrease intake of processed foods.  Recommend intake of daily multivitamin, Vitamin D, and calcium.  Recommend for preventive screenings, as well as recommend immunizations that include influenza, TDAP

## 2023-07-25 NOTE — Assessment & Plan Note (Signed)
Will check vitamin D level and supplement as needed.    Also encouraged to spend 15 minutes in the sun daily.   
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
## 2023-07-25 NOTE — Assessment & Plan Note (Signed)
Firm nodule palpated on left side, will order thyroid ultrasound

## 2023-07-26 LAB — CMP14+EGFR
ALT: 12 [IU]/L (ref 0–32)
AST: 18 [IU]/L (ref 0–40)
Albumin: 4.6 g/dL (ref 3.9–4.9)
Alkaline Phosphatase: 80 [IU]/L (ref 44–121)
BUN/Creatinine Ratio: 14 (ref 9–23)
BUN: 10 mg/dL (ref 6–20)
Bilirubin Total: <0.2 mg/dL (ref 0.0–1.2)
CO2: 26 mmol/L (ref 20–29)
Calcium: 9.9 mg/dL (ref 8.7–10.2)
Chloride: 103 mmol/L (ref 96–106)
Creatinine, Ser: 0.72 mg/dL (ref 0.57–1.00)
Globulin, Total: 3.1 g/dL (ref 1.5–4.5)
Glucose: 83 mg/dL (ref 70–99)
Potassium: 4.3 mmol/L (ref 3.5–5.2)
Sodium: 145 mmol/L — ABNORMAL HIGH (ref 134–144)
Total Protein: 7.7 g/dL (ref 6.0–8.5)
eGFR: 112 mL/min/{1.73_m2} (ref >59)

## 2023-07-26 LAB — CBC WITH DIFFERENTIAL/PLATELET
Basophils Absolute: 0 10*3/uL (ref 0.0–0.2)
Basos: 1 %
EOS (ABSOLUTE): 0.1 10*3/uL (ref 0.0–0.4)
Eos: 2 %
Hematocrit: 36.9 % (ref 34.0–46.6)
Hemoglobin: 11.3 g/dL (ref 11.1–15.9)
Immature Grans (Abs): 0 10*3/uL (ref 0.0–0.1)
Immature Granulocytes: 1 %
Lymphocytes Absolute: 1.9 10*3/uL (ref 0.7–3.1)
Lymphs: 34 %
MCH: 25.7 pg — ABNORMAL LOW (ref 26.6–33.0)
MCHC: 30.6 g/dL — ABNORMAL LOW (ref 31.5–35.7)
MCV: 84 fL (ref 79–97)
Monocytes Absolute: 0.7 10*3/uL (ref 0.1–0.9)
Monocytes: 12 %
Neutrophils Absolute: 2.7 10*3/uL (ref 1.4–7.0)
Neutrophils: 50 %
Platelets: 344 10*3/uL (ref 150–450)
RBC: 4.39 x10E6/uL (ref 3.77–5.28)
RDW: 12.3 % (ref 11.7–15.4)
WBC: 5.4 10*3/uL (ref 3.4–10.8)

## 2023-07-26 LAB — VITAMIN D 25 HYDROXY (VIT D DEFICIENCY, FRACTURES): Vit D, 25-Hydroxy: 36 ng/mL (ref 30.0–100.0)

## 2023-07-29 ENCOUNTER — Other Ambulatory Visit: Payer: Self-pay | Admitting: Nurse Practitioner

## 2023-07-29 ENCOUNTER — Ambulatory Visit
Admission: RE | Admit: 2023-07-29 | Discharge: 2023-07-29 | Disposition: A | Discharge status: Home / Self Care | Payer: Commercial Managed Care - PPO | Source: Ambulatory Visit | Attending: Nurse Practitioner | Admitting: Nurse Practitioner

## 2023-07-29 DIAGNOSIS — E041 Nontoxic single thyroid nodule: Secondary | ICD-10-CM

## 2023-07-29 DIAGNOSIS — G8929 Other chronic pain: Secondary | ICD-10-CM

## 2023-07-29 DIAGNOSIS — G43009 Migraine without aura, not intractable, without status migrainosus: Secondary | ICD-10-CM

## 2023-07-29 DIAGNOSIS — M549 Dorsalgia, unspecified: Secondary | ICD-10-CM | POA: Diagnosis not present

## 2023-07-29 DIAGNOSIS — R519 Headache, unspecified: Secondary | ICD-10-CM | POA: Diagnosis not present

## 2023-07-29 DIAGNOSIS — E559 Vitamin D deficiency, unspecified: Secondary | ICD-10-CM

## 2023-07-29 DIAGNOSIS — Z23 Encounter for immunization: Secondary | ICD-10-CM

## 2023-07-29 DIAGNOSIS — Z0000 Encounter for general adult medical examination without abnormal findings: Secondary | ICD-10-CM

## 2023-07-29 DIAGNOSIS — Z2821 Immunization not carried out because of patient refusal: Secondary | ICD-10-CM

## 2023-07-29 DIAGNOSIS — R202 Paresthesia of skin: Secondary | ICD-10-CM | POA: Diagnosis not present

## 2023-07-31 ENCOUNTER — Ambulatory Visit (INDEPENDENT_AMBULATORY_CARE_PROVIDER_SITE_OTHER): Payer: Commercial Managed Care - PPO | Admitting: Family Medicine

## 2023-07-31 ENCOUNTER — Encounter: Payer: Self-pay | Admitting: Family Medicine

## 2023-07-31 VITALS — BP 120/74 | HR 76 | Temp 97.5°F | Ht 68.0 in | Wt 174.4 lb

## 2023-07-31 DIAGNOSIS — N63 Unspecified lump in unspecified breast: Secondary | ICD-10-CM | POA: Insufficient documentation

## 2023-07-31 DIAGNOSIS — J069 Acute upper respiratory infection, unspecified: Secondary | ICD-10-CM | POA: Diagnosis not present

## 2023-07-31 DIAGNOSIS — N926 Irregular menstruation, unspecified: Secondary | ICD-10-CM | POA: Insufficient documentation

## 2023-07-31 DIAGNOSIS — R051 Acute cough: Secondary | ICD-10-CM

## 2023-07-31 DIAGNOSIS — R0981 Nasal congestion: Secondary | ICD-10-CM | POA: Diagnosis not present

## 2023-07-31 DIAGNOSIS — N920 Excessive and frequent menstruation with regular cycle: Secondary | ICD-10-CM | POA: Insufficient documentation

## 2023-07-31 DIAGNOSIS — J029 Acute pharyngitis, unspecified: Secondary | ICD-10-CM | POA: Diagnosis not present

## 2023-07-31 DIAGNOSIS — L910 Hypertrophic scar: Secondary | ICD-10-CM | POA: Insufficient documentation

## 2023-07-31 LAB — POCT RAPID STREP A (OFFICE): Rapid Strep A Screen: NEGATIVE

## 2023-07-31 NOTE — Patient Instructions (Addendum)
For cough you can take plain Robitussin (not the versions with the letters after the name).  You can also use Ludens cough drops.    For headache, fever, aches, pains you can take Tylenol.    For sinus congestion or allergies you can use saline nasal spray, Claritin.  You can also use Benadryl though it may cause drowsiness/make infant more drowsy.  For sore throat you can use Chloraseptic spray, warm salt water, Cepacol lozenges, Cepastat lozenges, Sucrets, Tylenol.  If symptoms continue/become worse by Thursday/Friday notify clinic for further medication.

## 2023-07-31 NOTE — Progress Notes (Signed)
Established Patient Office Visit   Subjective  Patient ID: Stacey Key, female    DOB: [birth year]  Age: 37 y.o. MRN: [id number]  Chief Complaint  Patient presents with   Cough    Patient came in today for chest tightness, started 2 days ago, patient has some congestion, coughing sore throat, runny nose, mucus (dark Yellow), has been tested for covid, Baby has cold and Husband     Patient is a 37 year old female seen for acute concern.  Patient endorses cough, congestion, sore throat x 2 days.  Cough nonproductive, now productive with thick yellow mucus.  Feels like needs to cough up phlegm.  Throat was sore last night resolved but has returned.  Patient's infant son who is in daycare as well as her husband have also been sick with similar symptoms.  At home COVID test negative.  Patient was afraid to take any medications due to breast-feeding.  Patient with history of asthma.  Has not had to use her inhaler.  Cough    Patient Active Problem List   Diagnosis Date Noted   Irregular periods 07/31/2023   Menorrhagia 07/31/2023   Breast lump 07/31/2023   Keloid scar 07/31/2023   Encounter for annual health examination 07/25/2023   Vitamin D deficiency 07/25/2023   Influenza vaccination declined 07/25/2023   COVID-19 vaccination declined 07/25/2023   Chronic midline thoracic back pain 07/25/2023   Migraine without aura and without status migrainosus, not intractable 07/25/2023   Immunization due 07/25/2023   Chest congestion 05/06/2023   Nasal congestion 05/06/2023   Delivery by emergency cesarean section 04/30/2023   Uterine leiomyoma 02/19/2023   Anemia 02/04/2023   Goiter 10/16/2021   Moderate persistent asthma 08/16/2017   Endometriosis 10/05/2016   Pain in pelvis 08/22/2016   Encounter for initial insertion of intrauterine contraceptive device 04/20/2016   Cyst of left ovary 05/05/2012   Past Medical History:  Diagnosis Date   Allergy    Anemia    Anxiety     Arthritis    shoulders, knee, hand   Arthritis 06/25/2012   Asthma    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   Cough 01/14/2020   Dysmenorrhea 08/22/2016   Endometriosis    GERD (gastroesophageal reflux disease)    GERD (gastroesophageal reflux disease) 11/13/2011   History of asthma 01/14/2020   History of COVID-19 01/14/2020   Ovarian cyst    Ovarian cyst, complex 05/05/2012   Left complex 5x 3cm ovarian cyst   Seasonal allergic rhinitis 08/16/2017   Vitamin D deficiency    Wheezing 01/14/2020   Past Surgical History:  Procedure Laterality Date   CESAREAN SECTION N/A 04/27/2023   Procedure: CESAREAN SECTION;  Surgeon: Jaymes Graff, MD;  Location: [facility];  Service: Obstetrics;  Laterality: N/A;   HERNIA REPAIR     LAPAROSCOPY N/A 08/22/2016   Procedure: LAPAROSCOPY OPERATIVE,excision of posterior culdesac endometriosis;  Surgeon: Hal Morales, MD;  Location: [facility];  Service: Gynecology;  Laterality: N/A;   WISDOM TOOTH EXTRACTION     Social History   Tobacco Use   Smoking status: Never   Smokeless tobacco: Never  Vaping Use   Vaping status: Never Used  Substance Use Topics   Alcohol use: No   Drug use: No   Family History  Problem Relation Age of Onset   Anemia Mother    GER disease Mother    Hypertension Father    Hypertension Maternal Grandmother    Breast cancer Maternal  Grandmother 50       over 50   Rheum arthritis Maternal Grandmother    Arthritis Maternal Grandmother    Hypertension Maternal Grandfather    Clotting disorder Maternal Grandfather    Skin cancer Maternal Grandfather    Breast cancer Paternal Grandmother    Hypertension Paternal Grandfather    Lung cancer Paternal Grandfather    Asthma Maternal Aunt    Other Neg Hx    No Known Allergies    Review of Systems  Respiratory:  Positive for cough.    Negative unless stated above    Objective:     BP 120/74 (BP Location: Left Arm, Patient Position: Sitting,  Cuff Size: Normal)   Pulse 76   Temp (!) 97.5 F (36.4 C) (Oral)   Ht 5\' 8"  (1.727 m)   Wt 174 lb 6.4 oz (79.1 kg)   LMP  (LMP Unknown)   SpO2 94%   Breastfeeding Yes   BMI 26.52 kg/m  BP Readings from Last 3 Encounters:  07/31/23 120/74  07/25/23 110/60  05/06/23 100/70   Wt Readings from Last 3 Encounters:  07/31/23 174 lb 6.4 oz (79.1 kg)  07/25/23 168 lb 12.8 oz (76.6 kg)  05/06/23 182 lb 12.8 oz (82.9 kg)      Physical Exam Constitutional:      General: She is not in acute distress.    Appearance: Normal appearance.  HENT:     Head: Normocephalic and atraumatic.     Nose: Nose normal.     Right Turbinates: Enlarged.     Left Turbinates: Enlarged.     Mouth/Throat:     Mouth: Mucous membranes are moist.     Pharynx: Posterior oropharyngeal erythema and postnasal drip present.     Tonsils: No tonsillar exudate.  Cardiovascular:     Rate and Rhythm: Normal rate and regular rhythm.     Heart sounds: Normal heart sounds. No murmur heard.    No gallop.  Pulmonary:     Effort: Pulmonary effort is normal. No respiratory distress.     Breath sounds: Normal breath sounds. No wheezing, rhonchi or rales.  Skin:    General: Skin is warm and dry.  Neurological:     Mental Status: She is alert and oriented to person, place, and time.     Results for orders placed or performed in visit on 07/31/23  POC Rapid Strep A  Result Value Ref Range   Rapid Strep A Screen Negative Negative      Assessment & Plan:  Viral URI  Sore throat -     POCT rapid strep A  Acute cough  Nasal congestion  Patient with acute URI symptoms x 2 days.  Advised likely viral etiology given duration.  Rapid strep testing negative.  At home COVID testing negative.  Continue supportive care with OTC cough/cold medications/remedies safe in pregnancy/breast-feeding.  Given list.  Advised to avoid any medications with decongestant.  Continue hydration, rest.  Advised to notify clinic for  continued or worsening symptoms in the next few days as could develop secondary bacterial infection such as sinusitis.  Albuterol inhaler as needed for history of asthma.  No follow-ups on file.   Deeann Saint, MD

## 2023-08-08 ENCOUNTER — Encounter: Payer: Self-pay | Admitting: Nurse Practitioner

## 2023-08-12 ENCOUNTER — Other Ambulatory Visit: Payer: Self-pay | Admitting: Nurse Practitioner

## 2023-08-15 ENCOUNTER — Other Ambulatory Visit: Payer: Self-pay | Admitting: Nurse Practitioner

## 2023-08-15 DIAGNOSIS — G8929 Other chronic pain: Secondary | ICD-10-CM

## 2023-08-26 ENCOUNTER — Other Ambulatory Visit: Payer: Self-pay

## 2023-08-26 ENCOUNTER — Encounter: Payer: Self-pay | Admitting: Rehabilitative and Restorative Service Providers"

## 2023-08-26 ENCOUNTER — Ambulatory Visit (INDEPENDENT_AMBULATORY_CARE_PROVIDER_SITE_OTHER): Payer: Commercial Managed Care - PPO | Admitting: Rehabilitative and Restorative Service Providers"

## 2023-08-26 DIAGNOSIS — R293 Abnormal posture: Secondary | ICD-10-CM | POA: Diagnosis not present

## 2023-08-26 DIAGNOSIS — M546 Pain in thoracic spine: Secondary | ICD-10-CM | POA: Diagnosis not present

## 2023-08-26 NOTE — Therapy (Signed)
OUTPATIENT PHYSICAL THERAPY EVALUATION   Patient Name: Stacey Key MRN: [id number] DOB:1986/03/24, 37 y.o., female Today's Date: 08/26/2023  END OF SESSION:  PT End of Session - 08/26/23 1338     Visit Number 1    Number of Visits 20    Date for PT Re-Evaluation 11/04/23    Authorization Type [facility] [employer] $25 copay    Progress Note Due on Visit 10    PT Start Time 1345    PT Stop Time 1407    PT Time Calculation (min) 22 min    Activity Tolerance Patient tolerated treatment well    Behavior During Therapy [facility] for tasks assessed/performed             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   Cough 01/14/2020   Dysmenorrhea 08/22/2016   Endometriosis    GERD (gastroesophageal reflux disease)    GERD (gastroesophageal reflux disease) 11/13/2011   History of asthma 01/14/2020   History of COVID-19 01/14/2020   Ovarian cyst    Ovarian cyst, complex 05/05/2012   Left complex 5x 3cm ovarian cyst   Seasonal allergic rhinitis 08/16/2017   Vitamin D deficiency    Wheezing 01/14/2020   Past Surgical History:  Procedure Laterality Date   CESAREAN SECTION N/A 04/27/2023   Procedure: CESAREAN SECTION;  Surgeon: Jaymes Graff, MD;  Location: [facility];  Service: Obstetrics;  Laterality: N/A;   HERNIA REPAIR     LAPAROSCOPY N/A 08/22/2016   Procedure: LAPAROSCOPY OPERATIVE,excision of posterior culdesac endometriosis;  Surgeon: Hal Morales, MD;  Location: [facility];  Service: Gynecology;  Laterality: N/A;   WISDOM TOOTH EXTRACTION     Patient Active Problem List   Diagnosis Date Noted   Irregular periods 07/31/2023   Menorrhagia 07/31/2023   Breast lump 07/31/2023   Keloid scar 07/31/2023   Encounter for annual health examination 07/25/2023   Vitamin D deficiency 07/25/2023   Influenza vaccination  declined 07/25/2023   COVID-19 vaccination declined 07/25/2023   Chronic midline thoracic back pain 07/25/2023   Migraine without aura and without status migrainosus, not intractable 07/25/2023   Immunization due 07/25/2023   Chest congestion 05/06/2023   Nasal congestion 05/06/2023   Delivery by emergency cesarean section 04/30/2023   Uterine leiomyoma 02/19/2023   Anemia 02/04/2023   Goiter 10/16/2021   Moderate persistent asthma 08/16/2017   Endometriosis 10/05/2016   Pain in pelvis 08/22/2016   Encounter for initial insertion of intrauterine contraceptive device 04/20/2016   Cyst of left ovary 05/05/2012    PCP: Arnette Felts, FNP  REFERRING PROVIDER: Arnette Felts, FNP  REFERRING DIAG: [phone] (ICD-10-CM) - Chronic midline thoracic back pain  Rationale for Evaluation and Treatment: Rehabilitation  THERAPY DIAG:  Pain in thoracic spine  Abnormal posture  ONSET DATE: July 2024  SUBJECTIVE:  SUBJECTIVE STATEMENT: Pt indicated having a baby about 4 months ago with complaints in mid section of back.  Reported some numbness in the area but improved now.  Reported prolonged activity like standing with house activity.  Symptoms noted in thoracic region.  Denied lumbar and LE symptoms.  Pt indicated sleeping in recliner due to baby care.   Reported complaints noted in lying in bed.    PERTINENT HISTORY:  Arthritis, GERD  PAIN:  NPRS scale: at worst 8/10 Pain location: back pain Pain description: tingling, sharp Aggravating factors: standing prolonged, bend forward activity Relieving factors: changing positioning, heating pad/hot shower  PRECAUTIONS: None  WEIGHT BEARING RESTRICTIONS: No  FALLS:  Has patient fallen in last 6 months? 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  Sitting prolonged  PLOF: Independent, baby care, hiking, dancing.   PATIENT GOALS: Reduce pain, get back activities.   OBJECTIVE:   PATIENT SURVEYS:  08/26/2023 FOTO eval:  53  predicted:  69  SCREENING FOR RED FLAGS: 08/26/2023 Bowel or bladder incontinence: No Cauda equina syndrome: No  COGNITION: 08/26/2023 Overall cognitive status: WFL normal      SENSATION: 08/26/2023 [facility]  MUSCLE LENGTH: 08/26/2023   POSTURE:  08/26/2023 rounded shoulders  PALPATION: 08/26/2023 Mild tenderness mid thoracic/lower thoracic centrally  THORACIC/LUMBAR  ROM:   AROM 08/26/2023  Thoracic Flexion [facility]  Thoracic Extension 75%   Right lateral flexion   Left lateral flexion   Right rotation WFL  Left rotation WFL     Lumbar extension 50% WFL Repeated x 5 in standing : 75 % WFL with reduced tightness indicated      (Blank rows = not tested)  SPECIAL TESTS:  08/26/2023 No specific testing indicated  FUNCTIONAL TESTS:  08/26/2023 No specific testing indicated  GAIT: 08/26/2023 Independent ambulation                                                                                                                                                                                                                   TODAY'S TREATMENT:  DATE: 08/26/2023  Therex:    HEP instruction/performance c cues for techniques, handout provided.  Trial set performed of each for comprehension and symptom assessment.  See below for exercise list  TherActivity Postural advice given on set up of prolonged sitting/standing activity to remove strain of forward flexion holding positioning.  Suggested adaptation of heigh of surface of table, etc as well as lowering body through LE positioning.   PATIENT EDUCATION:   08/26/2023 Education details: HEP, POC Person educated: Patient Education method: Programmer, multimedia, Demonstration, Verbal cues, and Handouts Education comprehension: verbalized understanding, returned demonstration, and verbal cues required  HOME EXERCISE PROGRAM: Access Code: [email or web address] URL: [email or web address] Date: 08/26/2023 Prepared by: Chyrel Masson  Exercises - Seated Thoracic Lumbar Extension  - 1-2 x daily - 7 x weekly - 1 sets - 10-15 reps - 3 hold - Standing Lumbar Extension with Counter  - 3-5 x daily - 7 x weekly - 1 sets - 5-10 reps - Seated Scapular Retraction  - 3-5 x daily - 7 x weekly - 1 sets - 5-10 reps - 3-5 hold - Standing Shoulder Row with Anchored Resistance  - 1-2 x daily - 7 x weekly - 1-2 sets - 10-15 reps - Shoulder Extension with Resistance  - 1-2 x daily - 7 x weekly - 1-2 sets - 10-15 reps - Shoulder External Rotation and Scapular Retraction with Resistance  - 1-2 x daily - 7 x weekly - 1-2 sets - 10-15 reps - Prone Alternating Arm and Leg Lifts  - 1-2 x daily - 7 x weekly - 1-2 sets - 10 reps - 3 hold  ASSESSMENT:  CLINICAL IMPRESSION: Patient is a 37 y.o. who comes to clinic with complaints of back pain with mobility, strength and movement coordination deficits that impair their ability to perform usual daily and recreational functional activities without increase difficulty/symptoms at this time.  Patient to benefit from skilled PT services to address impairments and limitations to improve to previous level of function without restriction secondary to condition.   OBJECTIVE IMPAIRMENTS: decreased activity tolerance, decreased endurance, decreased mobility, decreased ROM, increased fascial restrictions, impaired perceived functional ability, impaired flexibility, improper body mechanics, postural dysfunction, and pain.   ACTIVITY LIMITATIONS: carrying, lifting, bending, sitting, standing, sleeping, and caring for others  PARTICIPATION  LIMITATIONS: meal prep, cleaning, laundry, interpersonal relationship, shopping, community activity, and occupation  PERSONAL FACTORS:  Arthritis, GERD,   are also affecting patient's functional outcome.   REHAB POTENTIAL: Good  CLINICAL DECISION MAKING: Stable/uncomplicated  EVALUATION COMPLEXITY: Low   GOALS: Goals reviewed with patient? Yes  SHORT TERM GOALS: (target date for Short term goals are 3 weeks 09/16/2023)  1. Patient will demonstrate independent use of home exercise program to maintain progress from in clinic treatments.  Goal status: New  LONG TERM GOALS: (target dates for all long term goals are 10 weeks  11/04/2023 )   1. Patient will demonstrate/report pain at worst less than or equal to 2/10 to facilitate minimal limitation in daily activity secondary to pain symptoms.  Goal status: New   2. Patient will demonstrate independent use of home exercise program to facilitate ability to maintain/progress functional gains from skilled physical therapy services.  Goal status: New   3. Patient will demonstrate FOTO outcome > or = 69 % to indicate reduced disability due to condition.  Goal status: New   4. Patient will demonstrate lumbar extension 100 % WFL s symptoms to facilitate upright standing, walking posture at PLOF s limitation.  Goal status:  New   5.  Patient will demonstrate thoracic mobility WFL s symptoms for usual daily activity at PLOF.   Goal status: New   6.  Patient will demonstrate/report ability to perform household and childcare tasks within PLOF s restriction due to symptoms.  Goal status: New    PLAN:  PT FREQUENCY: 1-2x/week  PT DURATION: 10 weeks  PLANNED INTERVENTIONS: Can include 78295- PT Re-evaluation, 97110-Therapeutic exercises, 97530- Therapeutic activity, [id number]- Neuromuscular re-education, 97535- Self Care, 97140- Manual therapy, [phone]- Gait training, [phone]- Orthotic Fit/training, [phone]- Canalith repositioning, [id number]- Aquatic  Therapy, 97014- Electrical stimulation (unattended), [id number]- Electrical stimulation (manual), [id number]- Vasopneumatic device, [id number]- Ultrasound, [id number]- Traction (mechanical), [id number]- Ionotophoresis 4mg /ml Dexamethasone, Patient/Family education, Balance training, Stair training, Taping, Dry Needling, Joint mobilization, Joint manipulation, Spinal manipulation, Spinal mobilization, Scar mobilization, Vestibular training, Visual/preceptual remediation/compensation, DME instructions, Cryotherapy, and Moist heat.  All performed as medically necessary.  All included unless contraindicated  PLAN FOR NEXT SESSION: Review HEP knowledge/results.  Recheck mobility.   Chyrel Masson, PT, DPT, OCS, ATC 08/26/23  2:10 PM

## 2023-09-03 ENCOUNTER — Encounter: Payer: Commercial Managed Care - PPO | Admitting: Physical Therapy

## 2023-09-10 ENCOUNTER — Telehealth: Payer: Self-pay | Admitting: Rehabilitative and Restorative Service Providers"

## 2023-09-10 ENCOUNTER — Encounter: Payer: Commercial Managed Care - PPO | Admitting: Rehabilitative and Restorative Service Providers"

## 2023-09-10 NOTE — Telephone Encounter (Signed)
Called patient after 15 mins no show for appointment today.  Left message with call back number for rescheduling.   Chyrel Masson, PT, DPT, OCS, ATC 09/10/23  3:32 PM

## 2023-09-10 NOTE — Therapy (Incomplete)
OUTPATIENT PHYSICAL THERAPY TREATMENT   Patient Name: Stacey Key MRN: [id number] DOB:[birth year], 37 y.o., female Today's Date: 09/10/2023  END OF SESSION:    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   Cough 01/14/2020   Dysmenorrhea 08/22/2016   Endometriosis    GERD (gastroesophageal reflux disease)    GERD (gastroesophageal reflux disease) 11/13/2011   History of asthma 01/14/2020   History of COVID-19 01/14/2020   Ovarian cyst    Ovarian cyst, complex 05/05/2012   Left complex 5x 3cm ovarian cyst   Seasonal allergic rhinitis 08/16/2017   Vitamin D deficiency    Wheezing 01/14/2020   Past Surgical History:  Procedure Laterality Date   CESAREAN SECTION N/A 04/27/2023   Procedure: CESAREAN SECTION;  Surgeon: Jaymes Graff, MD;  Location: [facility];  Service: Obstetrics;  Laterality: N/A;   HERNIA REPAIR     LAPAROSCOPY N/A 08/22/2016   Procedure: LAPAROSCOPY OPERATIVE,excision of posterior culdesac endometriosis;  Surgeon: Hal Morales, MD;  Location: [facility]S;  Service: Gynecology;  Laterality: N/A;   WISDOM TOOTH EXTRACTION     Patient Active Problem List   Diagnosis Date Noted   Irregular periods 07/31/2023   Menorrhagia 07/31/2023   Breast lump 07/31/2023   Keloid scar 07/31/2023   Encounter for annual health examination 07/25/2023   Vitamin D deficiency 07/25/2023   Influenza vaccination declined 07/25/2023   COVID-19 vaccination declined 07/25/2023   Chronic midline thoracic back pain 07/25/2023   Migraine without aura and without status migrainosus, not intractable 07/25/2023   Immunization due 07/25/2023   Chest congestion 05/06/2023   Nasal congestion 05/06/2023   Delivery by emergency cesarean section 04/30/2023   Uterine leiomyoma 02/19/2023   Anemia 02/04/2023   Goiter 10/16/2021    Moderate persistent asthma 08/16/2017   Endometriosis 10/05/2016   Pain in pelvis 08/22/2016   Encounter for initial insertion of intrauterine contraceptive device 04/20/2016   Cyst of left ovary 05/05/2012    PCP: Arnette Felts, FNP  REFERRING PROVIDER: Arnette Felts, FNP  REFERRING DIAG: [phone] (ICD-10-CM) - Chronic midline thoracic back pain  Rationale for Evaluation and Treatment: Rehabilitation  THERAPY DIAG:  No diagnosis found.  ONSET DATE: July 2024  SUBJECTIVE:  SUBJECTIVE STATEMENT: Pt indicated having a baby about 4 months ago with complaints in mid section of back.  Reported some numbness in the area but improved now.  Reported prolonged activity like standing with house activity.  Symptoms noted in thoracic region.  Denied lumbar and LE symptoms.  Pt indicated sleeping in recliner due to baby care.   Reported complaints noted in lying in bed.    PERTINENT HISTORY:  Arthritis, GERD  PAIN:  NPRS scale: at worst 8/10 Pain location: back pain Pain description: tingling, sharp Aggravating factors: standing prolonged, bend forward activity Relieving factors: changing positioning, heating pad/hot shower  PRECAUTIONS: None  WEIGHT BEARING RESTRICTIONS: No  FALLS:  Has patient fallen in last 6 months? 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  Sitting prolonged  PLOF: Independent, baby care, hiking, dancing.   PATIENT GOALS: Reduce pain, get back activities.   OBJECTIVE:   PATIENT SURVEYS:  08/26/2023 FOTO eval:  53  predicted:  69  SCREENING FOR RED FLAGS: 08/26/2023 Bowel or bladder incontinence: No Cauda equina syndrome: No  COGNITION: 08/26/2023 Overall cognitive status: WFL  normal      SENSATION: 08/26/2023 [facility]  MUSCLE LENGTH: 08/26/2023   POSTURE:  08/26/2023 rounded shoulders  PALPATION: 08/26/2023 Mild tenderness mid thoracic/lower thoracic centrally  THORACIC/LUMBAR  ROM:   AROM 08/26/2023 09/10/2023  Thoracic Flexion [facility]   Thoracic Extension 75%    Right lateral flexion    Left lateral flexion    Right rotation WFL   Left rotation WFL       Lumbar extension 50% WFL Repeated x 5 in standing : 75 % WFL with reduced tightness indicated        (Blank rows = not tested)  SPECIAL TESTS:  08/26/2023 No specific testing indicated  FUNCTIONAL TESTS:  08/26/2023 No specific testing indicated  GAIT: 08/26/2023 Independent ambulation                                                                                                                                                                                                                   TODAY'S TREATMENT:  DATE: 09/10/2023  Therex:   TODAY'S TREATMENT:                                                                                                         DATE: 08/26/2023  Therex:    HEP instruction/performance c cues for techniques, handout provided.  Trial set performed of each for comprehension and symptom assessment.  See below for exercise list  TherActivity Postural advice given on set up of prolonged sitting/standing activity to remove strain of forward flexion holding positioning.  Suggested adaptation of heigh of surface of table, etc as well as lowering body through LE positioning.   PATIENT EDUCATION:  08/26/2023 Education details: HEP, POC Person educated: Patient Education method: Programmer, multimedia, Demonstration, Verbal cues, and Handouts Education comprehension: verbalized understanding, returned demonstration, and verbal cues required  HOME EXERCISE PROGRAM: Access  Code: [email or web address] URL: [email or web address] Date: 08/26/2023 Prepared by: Chyrel Masson  Exercises - Seated Thoracic Lumbar Extension  - 1-2 x daily - 7 x weekly - 1 sets - 10-15 reps - 3 hold - Standing Lumbar Extension with Counter  - 3-5 x daily - 7 x weekly - 1 sets - 5-10 reps - Seated Scapular Retraction  - 3-5 x daily - 7 x weekly - 1 sets - 5-10 reps - 3-5 hold - Standing Shoulder Row with Anchored Resistance  - 1-2 x daily - 7 x weekly - 1-2 sets - 10-15 reps - Shoulder Extension with Resistance  - 1-2 x daily - 7 x weekly - 1-2 sets - 10-15 reps - Shoulder External Rotation and Scapular Retraction with Resistance  - 1-2 x daily - 7 x weekly - 1-2 sets - 10-15 reps - Prone Alternating Arm and Leg Lifts  - 1-2 x daily - 7 x weekly - 1-2 sets - 10 reps - 3 hold  ASSESSMENT:  CLINICAL IMPRESSION: Patient is a 37 y.o. who comes to clinic with complaints of back pain with mobility, strength and movement coordination deficits that impair their ability to perform usual daily and recreational functional activities without increase difficulty/symptoms at this time.  Patient to benefit from skilled PT services to address impairments and limitations to improve to previous level of function without restriction secondary to condition.   OBJECTIVE IMPAIRMENTS: decreased activity tolerance, decreased endurance, decreased mobility, decreased ROM, increased fascial restrictions, impaired perceived functional ability, impaired flexibility, improper body mechanics, postural dysfunction, and pain.   ACTIVITY LIMITATIONS: carrying, lifting, bending, sitting, standing, sleeping, and caring for others  PARTICIPATION LIMITATIONS: meal prep, cleaning, laundry, interpersonal relationship, shopping, community activity, and occupation  PERSONAL FACTORS:  Arthritis, GERD,   are also affecting patient's functional outcome.   REHAB POTENTIAL: Good  CLINICAL DECISION MAKING:  Stable/uncomplicated  EVALUATION COMPLEXITY: Low   GOALS: Goals reviewed with patient? Yes  SHORT TERM GOALS: (target date for Short term goals are 3 weeks 09/16/2023)  1. Patient will demonstrate independent use of home exercise program to maintain progress from in clinic treatments.  Goal status: New  LONG TERM GOALS: (target dates for all long term goals are 10  weeks  11/04/2023 )   1. Patient will demonstrate/report pain at worst less than or equal to 2/10 to facilitate minimal limitation in daily activity secondary to pain symptoms.  Goal status: New   2. Patient will demonstrate independent use of home exercise program to facilitate ability to maintain/progress functional gains from skilled physical therapy services.  Goal status: New   3. Patient will demonstrate FOTO outcome > or = 69 % to indicate reduced disability due to condition.  Goal status: New   4. Patient will demonstrate lumbar extension 100 % WFL s symptoms to facilitate upright standing, walking posture at PLOF s limitation.  Goal status: New   5.  Patient will demonstrate thoracic mobility WFL s symptoms for usual daily activity at PLOF.   Goal status: New   6.  Patient will demonstrate/report ability to perform household and childcare tasks within PLOF s restriction due to symptoms.  Goal status: New    PLAN:  PT FREQUENCY: 1-2x/week  PT DURATION: 10 weeks  PLANNED INTERVENTIONS: Can include 09811- PT Re-evaluation, 97110-Therapeutic exercises, 97530- Therapeutic activity, [id number]- Neuromuscular re-education, 97535- Self Care, 97140- Manual therapy, [phone]- Gait training, [phone]- Orthotic Fit/training, [phone]- Canalith repositioning, [id number]- Aquatic Therapy, 97014- Electrical stimulation (unattended), [id number]- Electrical stimulation (manual), [id number]- Vasopneumatic device, [id number]- Ultrasound, [id number]- Traction (mechanical), [id number]- Ionotophoresis 4mg /ml Dexamethasone, Patient/Family education, Balance training,  Stair training, Taping, Dry Needling, Joint mobilization, Joint manipulation, Spinal manipulation, Spinal mobilization, Scar mobilization, Vestibular training, Visual/preceptual remediation/compensation, DME instructions, Cryotherapy, and Moist heat.  All performed as medically necessary.  All included unless contraindicated  PLAN FOR NEXT SESSION: Review HEP knowledge/results.  Recheck mobility.   Chyrel Masson, PT, DPT, OCS, ATC 09/10/23  12:15 PM

## 2023-12-20 ENCOUNTER — Ambulatory Visit: Admitting: Family Medicine

## 2023-12-20 ENCOUNTER — Encounter: Payer: Self-pay | Admitting: Family Medicine

## 2023-12-20 VITALS — BP 112/76 | HR 101 | Temp 98.1°F | Ht 68.0 in | Wt 174.8 lb

## 2023-12-20 DIAGNOSIS — R0981 Nasal congestion: Secondary | ICD-10-CM

## 2023-12-20 DIAGNOSIS — R059 Cough, unspecified: Secondary | ICD-10-CM | POA: Diagnosis not present

## 2023-12-20 DIAGNOSIS — H65191 Other acute nonsuppurative otitis media, right ear: Secondary | ICD-10-CM

## 2023-12-20 DIAGNOSIS — E049 Nontoxic goiter, unspecified: Secondary | ICD-10-CM

## 2023-12-20 DIAGNOSIS — J029 Acute pharyngitis, unspecified: Secondary | ICD-10-CM | POA: Diagnosis not present

## 2023-12-20 DIAGNOSIS — J302 Other seasonal allergic rhinitis: Secondary | ICD-10-CM

## 2023-12-20 DIAGNOSIS — R1310 Dysphagia, unspecified: Secondary | ICD-10-CM

## 2023-12-20 LAB — POCT INFLUENZA A/B
Influenza A, POC: NEGATIVE
Influenza B, POC: NEGATIVE

## 2023-12-20 LAB — POCT RAPID STREP A (OFFICE): Rapid Strep A Screen: NEGATIVE

## 2023-12-20 LAB — POC COVID19 BINAXNOW: SARS Coronavirus 2 Ag: NEGATIVE

## 2023-12-20 MED ORDER — AMOXICILLIN-POT CLAVULANATE 500-125 MG PO TABS: 1.0000 | ORAL_TABLET | Freq: Two times a day (BID) | ORAL | 0 refills | Status: AC

## 2023-12-20 MED ORDER — FLUTICASONE PROPIONATE 50 MCG/ACT NA SUSP
2.0000 | Freq: Every day | NASAL | 2 refills | Status: DC
Start: 2023-12-20 — End: 2024-05-28

## 2023-12-20 NOTE — Progress Notes (Signed)
 Established Patient Office Visit   Subjective  Patient ID: Stacey Key, female    DOB: March 12, 1986  Age: 38 y.o. MRN: [id number]  Chief Complaint  Patient presents with   Sinus Problem    Pt reports she thinks she has sinus issues. She reports her sx started on Monday, scratchy throat, congestion, itchy ear. Now congestion stuck in chest. Coughing up cough green. Feel pressure on forehead. Taking some natural rubotussin due to breast feeding. Pt denied fever, fatigue, body ache.    Nasal Congestion   Cough   Sore Throat    Patient is a 38 year old female followed by Penelope Galas, FNP seen for acute concern.  Patient endorses sore throat, productive cough x 5 days.  Patient also with right ear feeling itchy/like it is draining.  Patient endorses increased congestion that started last night with mucus now green in color.  Also notes pressure in frontal area times a few days.  Patient states her 76-month-old son has been sick every other week due to daycare and had similar symptoms prior to the start of hers.  Patient is breast-feeding.  Mild enlargement of left side of base of neck noted on exam prompting patient to mention dysphagia times a few weeks prior to start of current illness.  Pt  ate a piece of bread earlier this week that felt like it stayed stuck in her throat.  Endorses having thyroid ultrasound October/November 2024.  States ultrasound showed mild enlargement of a known nodule.  Cough  Sore Throat  Associated symptoms include coughing.    Patient Active Problem List   Diagnosis Date Noted   Irregular periods 07/31/2023   Menorrhagia 07/31/2023   Breast lump 07/31/2023   Keloid scar 07/31/2023   Encounter for annual health examination 07/25/2023   Vitamin D deficiency 07/25/2023   Influenza vaccination declined 07/25/2023   COVID-19 vaccination declined 07/25/2023   Chronic midline thoracic back pain 07/25/2023   Migraine without aura and without status  migrainosus, not intractable 07/25/2023   Immunization due 07/25/2023   Chest congestion 05/06/2023   Nasal congestion 05/06/2023   Delivery by emergency cesarean section 04/30/2023   Uterine leiomyoma 02/19/2023   Anemia 02/04/2023   Goiter 10/16/2021   Moderate persistent asthma 08/16/2017   Endometriosis 10/05/2016   Pain in pelvis 08/22/2016   Encounter for initial insertion of intrauterine contraceptive device 04/20/2016   Cyst of left ovary 05/05/2012   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   Cough 01/14/2020   Dysmenorrhea 08/22/2016   Endometriosis    GERD (gastroesophageal reflux disease)    GERD (gastroesophageal reflux disease) 11/13/2011   History of asthma 01/14/2020   History of COVID-19 01/14/2020   Ovarian cyst    Ovarian cyst, complex 05/05/2012   Left complex 5x 3cm ovarian cyst   Seasonal allergic rhinitis 08/16/2017   Vitamin D deficiency    Wheezing 01/14/2020   Past Surgical History:  Procedure Laterality Date   CESAREAN SECTION N/A 04/27/2023   Procedure: CESAREAN SECTION;  Surgeon: Jaymes Graff, MD;  Location: [facility] LD ORS;  Service: Obstetrics;  Laterality: N/A;   HERNIA REPAIR     LAPAROSCOPY N/A 08/22/2016   Procedure: LAPAROSCOPY OPERATIVE,excision of posterior culdesac endometriosis;  Surgeon: Hal Morales, MD;  Location: [facility]  ORS;  Service: Gynecology;  Laterality: N/A;   WISDOM TOOTH EXTRACTION     Social History   Tobacco Use   Smoking status: Never   Smokeless tobacco: Never  Vaping Use   Vaping status: Never Used  Substance Use Topics   Alcohol use: No   Drug use: No   Family History  Problem Relation Age of Onset   Anemia Mother    GER disease Mother    Hypertension Father    Hypertension Maternal Grandmother    Breast cancer Maternal Grandmother 50       over  84   Rheum arthritis Maternal Grandmother    Arthritis Maternal Grandmother    Hypertension Maternal Grandfather    Clotting disorder Maternal Grandfather    Skin cancer Maternal Grandfather    Breast cancer Paternal Grandmother    Hypertension Paternal Grandfather    Lung cancer Paternal Grandfather    Asthma Maternal Aunt    Other Neg Hx    No Known Allergies    Review of Systems  Respiratory:  Positive for cough.    Negative unless stated above    Objective:     BP 112/76 (BP Location: Left Arm, Patient Position: Sitting, Cuff Size: Large)   Pulse (!) 101   Temp 98.1 F (36.7 C) (Oral)   Ht 5\' 8"  (1.727 m)   Wt 174 lb 12.8 oz (79.3 kg)   LMP 11/22/2023 (Exact Date)   SpO2 98%   Breastfeeding Yes   BMI 26.58 kg/m  BP Readings from Last 3 Encounters:  12/20/23 112/76  07/31/23 120/74  07/25/23 110/60   Wt Readings from Last 3 Encounters:  12/20/23 174 lb 12.8 oz (79.3 kg)  07/31/23 174 lb 6.4 oz (79.1 kg)  07/25/23 168 lb 12.8 oz (76.6 kg)      Physical Exam Constitutional:      General: She is not in acute distress.    Appearance: Normal appearance.  HENT:     Head: Normocephalic and atraumatic.     Ears:     Comments: Right TM full with effusion, mild erythema starting.  Left TM normal.    Nose: Nose normal.     Mouth/Throat:     Mouth: Mucous membranes are moist.  Neck:     Thyroid: Thyromegaly present.     Comments: Thyromegaly, left>R. Cardiovascular:     Rate and Rhythm: Normal rate and regular rhythm.     Heart sounds: Normal heart sounds. No murmur heard.    No gallop.  Pulmonary:     Effort: Pulmonary effort is normal. No respiratory distress.     Breath sounds: Normal breath sounds. No wheezing, rhonchi or rales.  Skin:    General: Skin is warm and dry.  Neurological:     Mental Status: She is alert and oriented to person, place, and time.     Results for orders placed or performed in visit on 12/20/23  POC Influenza A/B  Result  Value Ref Range   Influenza A, POC Negative Negative   Influenza B, POC Negative Negative  POC COVID-19  Result Value Ref Range   SARS Coronavirus 2 Ag Negative Negative  POC Rapid Strep A  Result Value Ref Range   Rapid Strep A Screen Negative Negative      Assessment & Plan:  Acute effusion of right ear -     Amoxicillin-Pot Clavulanate; Take 1 tablet by mouth in the morning and at bedtime for 7 days.  Dispense: 14 tablet;  Refill: 0  Sore throat -     POCT rapid strep A  Cough, unspecified type -     POCT Influenza A/B -     POC COVID-19 BinaxNow  Nasal congestion -     POCT Influenza A/B -     POC COVID-19 BinaxNow -     Fluticasone Propionate; Place 2 sprays into both nostrils daily.  Dispense: 16 g; Refill: 2  Goiter  Dysphagia, unspecified type  Seasonal allergies   Patient with acute URI symptoms x 5 days.  POC COVID and flu testing negative.  Given Rx for Augmentin due to right ear effusion that appears to be developing infection.  Continue supportive care with OTC cough/cold medications safe in breast-feeding (avoid decongestants).  Okay to take second-generation antihistamine for allergy symptoms.  Advised can pass through breastmilk.  Patient with goiter on exam left lobe greater than right and new dysphagia.  Ultrasound of thyroid from late 2024 reviewed.  Discussed obtaining labs to monitor thyroid function as last done in 2023.  Given new dysphagia and size of nodule on ultrasound consider aspiration.  Has CPE with PCP next week.  Return if symptoms worsen or fail to improve.   Deeann Saint, MD

## 2023-12-20 NOTE — Patient Instructions (Addendum)
 The last time he had your thyroid labs checked was 02/22/2022 and they were normal.   You can take Claritin, Zyrtec, or Allegra while breast-feeding however small amounts will pass through your breastmilk.  You may notice drowsiness, jitteriness, and irritability in your baby.  A prescription for Augmentin and Flonase was sent to your pharmacy.

## 2023-12-26 ENCOUNTER — Ambulatory Visit: Payer: Self-pay | Admitting: Nurse Practitioner

## 2023-12-30 ENCOUNTER — Encounter: Payer: Self-pay | Admitting: Nurse Practitioner

## 2023-12-30 ENCOUNTER — Ambulatory Visit (INDEPENDENT_AMBULATORY_CARE_PROVIDER_SITE_OTHER): Payer: Self-pay | Admitting: Nurse Practitioner

## 2023-12-30 VITALS — BP 100/60 | HR 95 | Temp 98.4°F | Ht 68.0 in | Wt 176.0 lb

## 2023-12-30 DIAGNOSIS — E041 Nontoxic single thyroid nodule: Secondary | ICD-10-CM | POA: Diagnosis not present

## 2023-12-30 DIAGNOSIS — Z2821 Immunization not carried out because of patient refusal: Secondary | ICD-10-CM

## 2023-12-30 MED ORDER — MONTELUKAST SODIUM 10 MG PO TABS: 10.0000 mg | ORAL_TABLET | Freq: Every day | ORAL | 1 refills | Status: DC

## 2023-12-30 NOTE — Progress Notes (Signed)
 Stacey Key, CMA,acting as a Neurosurgeon for Stacey Felts, FNP.,have documented all relevant documentation on the behalf of Stacey Felts, FNP,as directed by  Stacey Felts, FNP while in the presence of Stacey Felts, FNP.  Subjective:  Patient ID: Stacey Key , female    DOB: 03-07-1986 , 38 y.o.   MRN: [id number]  Chief Complaint  Patient presents with   Hypothyroidism    HPI  Patient presents today for a thyroid follow up. She was treated for an ear infection. She was treated with an antibiotic. She also was reported to have swelling.  Patient reports compliance with medication. Patient denies any chest pain, SOB, or headaches. Patient reports she was told her nodule was getting bigger, she reports last week it felt like her food was getting stuck in her throat.      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   Cough 01/14/2020   Dysmenorrhea 08/22/2016   Endometriosis    GERD (gastroesophageal reflux disease)    GERD (gastroesophageal reflux disease) 11/13/2011   History of asthma 01/14/2020   History of COVID-19 01/14/2020   Ovarian cyst    Ovarian cyst, complex 05/05/2012   Left complex 5x 3cm ovarian cyst   Seasonal allergic rhinitis 08/16/2017   Vitamin D deficiency    Wheezing 01/14/2020     Family History  Problem Relation Age of Onset   Anemia Mother    GER disease Mother    Hypertension Father    Hypertension Maternal Grandmother    Breast cancer Maternal Grandmother 50       over 7   Rheum arthritis Maternal Grandmother    Arthritis Maternal Grandmother    Hypertension Maternal Grandfather    Clotting disorder Maternal Grandfather    Skin cancer Maternal Grandfather    Breast cancer Paternal Grandmother    Hypertension Paternal Grandfather    Lung cancer Paternal Grandfather    Asthma Maternal  Aunt    Other Neg Hx      Current Outpatient Medications:    cetirizine (ZYRTEC) 10 MG tablet, Take 1 tablet (10 mg total) by mouth daily., Disp: 30 tablet, Rfl: 11   fluticasone (FLONASE) 50 MCG/ACT nasal spray, Place 2 sprays into both nostrils daily., Disp: 16 g, Rfl: 2   omeprazole (PRILOSEC) 20 MG capsule, Take 1 capsule (20 mg total) by mouth daily., Disp: 30 capsule, Rfl: 0   albuterol (VENTOLIN HFA) 108 (90 Base) MCG/ACT inhaler, Inhale 2 puffs into the lungs every 6 (six) hours as needed for wheezing or shortness of breath. (Patient not taking: Reported on 07/31/2023), Disp: 8 g, Rfl: 2   montelukast (SINGULAIR) 10 MG tablet, Take 1 tablet (10 mg total) by mouth at bedtime., Disp: 90 tablet, Rfl: 1   ondansetron (ZOFRAN) 4 MG tablet, Take 1 tablet by mouth 3 (three) times daily as needed. (Patient not taking: Reported on 07/31/2023), Disp: , Rfl:    Prenatal Vit-Fe Fumarate-FA (PRENATAL MULTIVITAMIN) TABS tablet, Take 1 tablet by mouth daily at 12 noon. (Patient not taking: Reported on 07/31/2023), Disp: , Rfl:    No Known Allergies   Review of Systems  Constitutional: Negative.   HENT:  Positive for ear pain (rigt ear pain) and trouble swallowing.   Respiratory: Negative.    Cardiovascular:  Negative.   Gastrointestinal: Negative.   Psychiatric/Behavioral: Negative.       Today's Vitals   12/30/23 1627  BP: 100/60  Pulse: 95  Temp: 98.4 F (36.9 C)  TempSrc: Oral  Weight: 176 lb (79.8 kg)  Height: 5\' 8"  (1.727 m)  PainSc: 0-No pain   Body mass index is 26.76 kg/m.  Wt Readings from Last 3 Encounters:  12/30/23 176 lb (79.8 kg)  12/20/23 174 lb 12.8 oz (79.3 kg)  07/31/23 174 lb 6.4 oz (79.1 kg)      Objective:  Physical Exam Constitutional:      General: She is not in acute distress.    Appearance: Normal appearance.  HENT:     Right Ear: Tympanic membrane is bulging.     Left Ear: Tympanic membrane is not bulging.  Neck:     Thyroid: Thyroid mass (left  thyroid mass) present.  Musculoskeletal:     Cervical back: No tenderness.  Neurological:     Mental Status: She is alert.         Assessment And Plan:  Thyroid nodule Assessment & Plan: Left thyroid with firm mass present. She had a thyroid ultrasound in November with the size increasing to 6.1 cm increased from 5.2 cm was recommended to have a guided needle biopsy done. Will refer to ENT for further evaluation  Orders: -     TSH + free T4 -     Ambulatory referral to ENT  COVID-19 vaccination declined Assessment & Plan: Declines covid 19 vaccine. Discussed risk of covid 63 and if she changes her mind about the vaccine to call the office. Education has been provided regarding the importance of this vaccine but patient still declined. Advised may receive this vaccine at local pharmacy or Health Dept.or vaccine clinic. Aware to provide a copy of the vaccination record if obtained from local pharmacy or Health Dept.  Encouraged to take multivitamin, vitamin d, vitamin c and zinc to increase immune system. Aware can call office if would like to have vaccine here at office. Verbalized acceptance and understanding.    Other orders -     Montelukast Sodium; Take 1 tablet (10 mg total) by mouth at bedtime.  Dispense: 90 tablet; Refill: 1   Return for keep same next..  Patient was given opportunity to ask questions. Patient verbalized understanding of the plan and was able to repeat key elements of the plan. All questions were answered to their satisfaction.    Jeanell Sparrow, FNP, have reviewed all documentation for this visit. The documentation on 12/30/23 for the exam, diagnosis, procedures, and orders are all accurate and complete.   IF YOU HAVE BEEN REFERRED TO A SPECIALIST, IT MAY TAKE 1-2 WEEKS TO SCHEDULE/PROCESS THE REFERRAL. IF YOU HAVE NOT HEARD FROM US/SPECIALIST IN TWO WEEKS, PLEASE GIVE [place] A CALL AT [phone].

## 2023-12-30 NOTE — Assessment & Plan Note (Signed)
 Left thyroid with firm mass present. She had a thyroid ultrasound in November with the size increasing to 6.1 cm increased from 5.2 cm was recommended to have a guided needle biopsy done. Will refer to ENT for further evaluation

## 2023-12-30 NOTE — Assessment & Plan Note (Signed)

## 2023-12-31 ENCOUNTER — Encounter: Payer: Self-pay | Admitting: Nurse Practitioner

## 2023-12-31 LAB — TSH+FREE T4
Free T4: 0.73 ng/dL — ABNORMAL LOW (ref 0.82–1.77)
TSH: 3.59 u[IU]/mL (ref 0.450–4.500)

## 2024-01-03 ENCOUNTER — Encounter (INDEPENDENT_AMBULATORY_CARE_PROVIDER_SITE_OTHER): Payer: Self-pay | Admitting: Otolaryngology

## 2024-01-13 ENCOUNTER — Other Ambulatory Visit: Payer: Self-pay

## 2024-01-13 MED ORDER — ALBUTEROL SULFATE HFA 108 (90 BASE) MCG/ACT IN AERS: 2.0000 | INHALATION_SPRAY | Freq: Four times a day (QID) | RESPIRATORY_TRACT | 2 refills | Status: DC | PRN

## 2024-02-25 ENCOUNTER — Ambulatory Visit: Admitting: Nurse Practitioner

## 2024-02-25 ENCOUNTER — Encounter: Payer: Self-pay | Admitting: Nurse Practitioner

## 2024-02-25 VITALS — BP 120/60 | HR 65 | Temp 98.4°F | Ht 68.0 in | Wt 173.6 lb

## 2024-02-25 DIAGNOSIS — R0989 Other specified symptoms and signs involving the circulatory and respiratory systems: Secondary | ICD-10-CM | POA: Diagnosis not present

## 2024-02-25 DIAGNOSIS — Z2821 Immunization not carried out because of patient refusal: Secondary | ICD-10-CM

## 2024-02-25 DIAGNOSIS — R051 Acute cough: Secondary | ICD-10-CM

## 2024-02-25 DIAGNOSIS — J029 Acute pharyngitis, unspecified: Secondary | ICD-10-CM

## 2024-02-25 DIAGNOSIS — R52 Pain, unspecified: Secondary | ICD-10-CM

## 2024-02-25 DIAGNOSIS — J3489 Other specified disorders of nose and nasal sinuses: Secondary | ICD-10-CM

## 2024-02-25 DIAGNOSIS — R0981 Nasal congestion: Secondary | ICD-10-CM

## 2024-02-25 LAB — POCT RAPID STREP A (OFFICE): Rapid Strep A Screen: NEGATIVE

## 2024-02-25 LAB — POC INFLUENZA A&B (BINAX/QUICKVUE)
Influenza A, POC: NEGATIVE
Influenza B, POC: NEGATIVE

## 2024-02-25 MED ORDER — AZELASTINE HCL 0.1 % NA SOLN: 2.0000 | Freq: Two times a day (BID) | NASAL | 1 refills | Status: AC

## 2024-02-25 NOTE — Progress Notes (Signed)
 [place]avia, CMA,acting as a Neurosurgeon for Susanna Epley, FNP.,have documented all relevant documentation on the behalf of Susanna Epley, FNP,as directed by  Susanna Epley, FNP while in the presence of Susanna Epley, FNP.  Subjective:  Patient ID: Stacey Key , female    DOB: 1986-01-15 , 38 y.o.   MRN: [id number]  Chief Complaint  Patient presents with   URI    Patient presents today for headache, sore throat, cough, chest and nasal congestion, body aches. Patient reports her symptoms started Sunday. She reports her husband and son both having nasal and chest congestion. Patient reports she took a covid test last night and this morning and they were both negative.      HPI  Her son has an ear infection. She has congestion with mucous (yellow), denies fever. She has both head congestion and runny nose. She is using her flonase . Denies wheezing.      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   Cough 01/14/2020   Dysmenorrhea 08/22/2016   Endometriosis    GERD (gastroesophageal reflux disease)    GERD (gastroesophageal reflux disease) 11/13/2011   History of asthma 01/14/2020   History of COVID-19 01/14/2020   Ovarian cyst    Ovarian cyst, complex 05/05/2012   Left complex 5x 3cm ovarian cyst   Seasonal allergic rhinitis 08/16/2017   Vitamin D  deficiency    Wheezing 01/14/2020     Family History  Problem Relation Age of Onset   Anemia Mother    GER disease Mother    Hypertension Father    Hypertension Maternal Grandmother    Breast cancer Maternal Grandmother 50       over 69   Rheum arthritis Maternal Grandmother    Arthritis Maternal Grandmother    Hypertension Maternal Grandfather    Clotting disorder Maternal Grandfather    Skin cancer Maternal Grandfather    Breast cancer Paternal Grandmother     Hypertension Paternal Grandfather    Lung cancer Paternal Grandfather    Asthma Maternal Aunt    Other Neg Hx      Current Outpatient Medications:    albuterol  (VENTOLIN  HFA) 108 (90 Base) MCG/ACT inhaler, Inhale 2 puffs into the lungs every 6 (six) hours as needed for wheezing or shortness of breath., Disp: 8 g, Rfl: 2   azelastine  (ASTELIN ) 0.1 % nasal spray, Place 2 sprays into both nostrils 2 (two) times daily. Use in each nostril as directed, Disp: 30 mL, Rfl: 1   cetirizine  (ZYRTEC ) 10 MG tablet, Take 1 tablet (10 mg total) by mouth daily., Disp: 30 tablet, Rfl: 11   fluticasone  (FLONASE ) 50 MCG/ACT nasal spray, Place 2 sprays into both nostrils daily., Disp: 16 g, Rfl: 2   montelukast  (SINGULAIR ) 10 MG tablet, Take 1 tablet (10 mg total) by mouth at bedtime., Disp: 90 tablet, Rfl: 1   omeprazole  (PRILOSEC) 20 MG capsule, Take 1 capsule (20 mg total) by mouth daily., Disp: 30 capsule, Rfl: 0   ondansetron  (ZOFRAN ) 4 MG tablet, Take 1 tablet by mouth 3 (three) times daily as needed. (Patient not taking: Reported on 02/25/2024), Disp: , Rfl:    Prenatal Vit-Fe Fumarate-FA (PRENATAL MULTIVITAMIN) TABS tablet, Take 1 tablet by mouth daily at 12 noon. (Patient not taking: Reported on 02/25/2024),  Disp: , Rfl:    No Known Allergies   Review of Systems  Constitutional: Negative.   HENT:  Positive for congestion and rhinorrhea.   Respiratory:  Positive for cough. Negative for shortness of breath and wheezing.   Cardiovascular: Negative.   Musculoskeletal:  Positive for myalgias.  Neurological:  Positive for headaches.  Psychiatric/Behavioral: Negative.       Today's Vitals   02/25/24 1120  BP: 120/60  Pulse: 65  Temp: 98.4 F (36.9 C)  TempSrc: Oral  Weight: 173 lb 9.6 oz (78.7 kg)  Height: 5\' 8"  (1.727 m)  PainSc: 8   PainLoc: Head   Body mass index is 26.4 kg/m.  Wt Readings from Last 3 Encounters:  02/25/24 173 lb 9.6 oz (78.7 kg)  12/30/23 176 lb (79.8 kg)  12/20/23 174  lb 12.8 oz (79.3 kg)    The ASCVD Risk score (Arnett DK, et al., 2019) failed to calculate for the following reasons:   The 2019 ASCVD risk score is only valid for ages 31 to 96  Objective:  Physical Exam Vitals reviewed.  Constitutional:      General: She is not in acute distress.    Appearance: Normal appearance. She is well-developed.  HENT:     Head: Normocephalic and atraumatic.     Right Ear: Tympanic membrane, ear canal and external ear normal. There is no impacted cerumen.     Left Ear: Tympanic membrane, ear canal and external ear normal. There is no impacted cerumen.     Nose: Congestion present.     Right Sinus: No maxillary sinus tenderness or frontal sinus tenderness.     Left Sinus: No maxillary sinus tenderness or frontal sinus tenderness.     Mouth/Throat:     Mouth: Mucous membranes are moist.     Pharynx: Postnasal drip present. No pharyngeal swelling or posterior oropharyngeal erythema.  Eyes:     Pupils: Pupils are equal, round, and reactive to light.  Cardiovascular:     Rate and Rhythm: Normal rate and regular rhythm.     Pulses: Normal pulses.     Heart sounds: Normal heart sounds. No murmur heard. Pulmonary:     Effort: Pulmonary effort is normal. No respiratory distress.     Breath sounds: Normal breath sounds. No wheezing.  Musculoskeletal:        General: Normal range of motion.     Cervical back: Normal range of motion and neck supple.  Skin:    General: Skin is warm and dry.     Capillary Refill: Capillary refill takes less than 2 seconds.  Neurological:     General: No focal deficit present.     Mental Status: She is alert and oriented to person, place, and time.     Cranial Nerves: No cranial nerve deficit.  Psychiatric:        Mood and Affect: Mood normal.        Behavior: Behavior normal.        Thought Content: Thought content normal.        Judgment: Judgment normal.        Assessment And Plan:  COVID-19 vaccination  declined Assessment & Plan: She currently has cold symptoms. Negative rapid covid    Acute cough Assessment & Plan: Encouraged to avoid dairy products. Likely related to viral infection. Unfortunately she has limited options for treatment due to breast feeding. Advised if symptoms continue to worsen return call to office for possible antibiotics  Orders: -  POC Influenza A&B(BINAX/QUICKVUE)  Sore throat -     POC Influenza A&B(BINAX/QUICKVUE) -     POCT rapid strep A  Chest congestion -     POC Influenza A&B(BINAX/QUICKVUE)  Nasal congestion -     POC Influenza A&B(BINAX/QUICKVUE) -     Azelastine  HCl; Place 2 sprays into both nostrils 2 (two) times daily. Use in each nostril as directed  Dispense: 30 mL; Refill: 1  Sinus pressure -     POC Influenza A&B(BINAX/QUICKVUE)    Return for keep same next.  Patient was given opportunity to ask questions. Patient verbalized understanding of the plan and was able to repeat key elements of the plan. All questions were answered to their satisfaction.    Inge Mangle, FNP, have reviewed all documentation for this visit. The documentation on 02/25/24 for the exam, diagnosis, procedures, and orders are all accurate and complete.   IF YOU HAVE BEEN REFERRED TO A SPECIALIST, IT MAY TAKE 1-2 WEEKS TO SCHEDULE/PROCESS THE REFERRAL. IF YOU HAVE NOT HEARD FROM US /SPECIALIST IN TWO WEEKS, PLEASE GIVE US  A CALL AT [phone].

## 2024-02-29 DIAGNOSIS — R051 Acute cough: Secondary | ICD-10-CM | POA: Insufficient documentation

## 2024-02-29 DIAGNOSIS — J3489 Other specified disorders of nose and nasal sinuses: Secondary | ICD-10-CM | POA: Insufficient documentation

## 2024-02-29 DIAGNOSIS — J029 Acute pharyngitis, unspecified: Secondary | ICD-10-CM | POA: Insufficient documentation

## 2024-02-29 NOTE — Assessment & Plan Note (Signed)
 Encouraged to avoid dairy products. Likely related to viral infection. Unfortunately she has limited options for treatment due to breast feeding. Advised if symptoms continue to worsen return call to office for possible antibiotics

## 2024-02-29 NOTE — Assessment & Plan Note (Signed)
 She currently has cold symptoms. Negative rapid covid

## 2024-03-05 ENCOUNTER — Encounter (INDEPENDENT_AMBULATORY_CARE_PROVIDER_SITE_OTHER): Payer: Self-pay | Admitting: Otolaryngology

## 2024-03-05 ENCOUNTER — Ambulatory Visit (INDEPENDENT_AMBULATORY_CARE_PROVIDER_SITE_OTHER): Admitting: Otolaryngology

## 2024-03-05 VITALS — BP 119/85 | HR 53

## 2024-03-05 DIAGNOSIS — E041 Nontoxic single thyroid nodule: Secondary | ICD-10-CM | POA: Diagnosis not present

## 2024-03-05 DIAGNOSIS — K219 Gastro-esophageal reflux disease without esophagitis: Secondary | ICD-10-CM

## 2024-03-05 DIAGNOSIS — J3089 Other allergic rhinitis: Secondary | ICD-10-CM

## 2024-03-05 DIAGNOSIS — R0982 Postnasal drip: Secondary | ICD-10-CM

## 2024-03-05 DIAGNOSIS — R0981 Nasal congestion: Secondary | ICD-10-CM

## 2024-03-05 DIAGNOSIS — E049 Nontoxic goiter, unspecified: Secondary | ICD-10-CM

## 2024-03-05 NOTE — Patient Instructions (Addendum)
 Operative Report  PREOPERATIVE DIAGNOSIS: 1. Multinodular nodular goiter 2. Thyroid  nodule with biopsy results positive for thyroid  cancer   POSTOPERATIVE DIAGNOSIS: 1. Multinodular nodular goiter 2. Thyroid  nodule with biopsy results positive for thyroid  cancer   Procedure: Total Left Thyroid  Lobectomy and Isthmustectomy with Recurrent Laryngeal nerve monitoring  Surgeon: Artice Last, MD  Assistant: Lorane Rocker, PA-C  IVF: per anesthesia  Anesthesia: General  Specimen:  Left thyroid  lobe and thyroid  isthmus, stitch marks superior thyroid  pole  Indication: The patient has been seen in the office for thyroid  nodules and multinodular goiter. 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 Risks and Benefits of surgery (total thyroidectomy vs thyroid  lobectomy) were discussed with the patient including postoperative bleeding, anesthesia risk, trouble breathing due to expanding hematoma, dysphagia, dehydration, keloid of the wound, wound revision due to cosmetic concerns, sore throat, hoarseness due to recurrent laryngeal nerve injury that is temporary or permanent, superior laryngeal nerve injury, voice pitch changes,  seroma, wound infection, inadvertent parathyroid removal, hypocalcemia, need for drain placement, need for tracheostomy, and need for additional surgery if pathology indicates. The patient elected to proceed with thyroid  lobectomy. Informed consent was signed.   Operative Procedure:  The patient was correctly identified and brought to the operating room and placed in supine position. An endotracheal tube was placed without difficulty and the head of bed rotated 180 degrees with respect to anesthesia.This was a recurrent laryngeal nerve monitoring tube and it was verified to be working accurately. The patient was then prepped and draped in a sterile fashion.  An  incision was made in a prominent cervical skin crease and about 2 fingerbreaths above the sternal notch. Next an incision was made through the platysma with a knife. Subplatysmal flaps were lifted towards the thyroid  notch superiorly and to the sternal notch inferiorly. Laterally the dissection was carried out to the SCM muscle. Next the straps were divided in the median raphe. The isthmus was identified and left thyroid  lobe was exposed. The gland was freed of attachments to the strap muscles. Next the superior pole of the thyroid  was identified and inferiorly reflected. The superior thyroid  artery and vein were identified and ligated. This allowed for medial rotation of the gland and the middle thyroid  vein was ligated. Once freed, the inferior thyroid  artery and vein were identified and ligated as well. Finally, the recurrent laryngeal nerve was identified and followed into the cricoid cartilage at [facility] ligament. Care was taken to identify the parathyroid glands in the inferior and superior poles. The vascular supply was identified and ligated medial and on the capsule of the thyroid  to preserve the functionality. In dissecting the superior parathyroid, the RLN was identified as it entered the cricothyroid junction. The thyroid  was carefully freed in this location while continuously maintaining the integrity of the nerve. The thyroid  was then dissected with Bipolar cautery off of the anterior tracheal cartilage rings. The isthmus was similarly freed and ligated. Hemostasis was obtained.   The wound was copiously irrigated and a valsalva done to make sure no additional bleeding was noted. The wound was closed in layers: first the strap muscles were re-approximated, followed by the platysma, and finally the skin in a subcuticular fashion. A JP drain was placed as well prior to closure and secured to the skin. The patient was returned to anesthesia and taken to PACU in stable condition.   The advanced  practice practitioner (APP) assisted throughout the case. The APP was essential in retraction and counter traction when needed to make the case progress smoothly. This retraction and assistance made it possible to see the tissue plains for the procedure. The assistance was needed for tissue re-approximation and assisted with closure of the incision site   Google this procedure and research it thyroid  radiofrequency ablation

## 2024-03-05 NOTE — Progress Notes (Signed)
 ENT CONSULT:  Reason for Consult: thyroid  goiter    HPI: Discussed the use of AI scribe software for clinical note transcription with the patient, who gave verbal consent to proceed.  History of Present Illness Stacey Key "[place]" is a 38 year old female who presents with concerns about an enlarging thyroid  nodule on the left side   She initially discovered the thyroid  nodule during a routine physical exam, which led to an ultrasound. A subsequent evaluation noted the nodule's enlargement, prompting another ultrasound. The nodule increased in size from approximately 5.5 cm to 6.2 cm but did not meet criteria for biopsy based on its characteristics (TR-2)   Occasionally, she feels tightness in her neck, which is not constant but can be influenced by clothing. No shortness of breath is reported. No voice changes.   She has a history of allergies and was prescribed Singulair , which she discontinued due to concerns about its effects while breastfeeding. She also has a history of heartburn and takes medication for acid reflux, which provides intermittent relief for her throat clearing symptoms.  Records Reviewed:  12/30/23 PCP visit  Patient presents today for a thyroid  follow up. She was treated for an ear infection. She was treated with an antibiotic. She also was reported to have swelling. Patient reports compliance with medication. Patient denies any chest pain, SOB, or headaches. Patient reports she was told her nodule was getting bigger, she reports last week it felt like her food was getting stuck in her throat.   Montelukast  Sodium; Take 1 tablet (10 mg total) by mouth at bedtime. 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   Cough 01/14/2020   Dysmenorrhea 08/22/2016   Endometriosis    GERD  (gastroesophageal reflux disease)    GERD (gastroesophageal reflux disease) 11/13/2011   History of asthma 01/14/2020   History of COVID-19 01/14/2020   Ovarian cyst    Ovarian cyst, complex 05/05/2012   Left complex 5x 3cm ovarian cyst   Seasonal allergic rhinitis 08/16/2017   Vitamin D  deficiency    Wheezing 01/14/2020    Past Surgical History:  Procedure Laterality Date   CESAREAN SECTION N/A 04/27/2023   Procedure: CESAREAN SECTION;  Surgeon: Marylu Soda, MD;  Location: [facility];  Service: Obstetrics;  Laterality: N/A;   HERNIA REPAIR     LAPAROSCOPY N/A 08/22/2016   Procedure: LAPAROSCOPY OPERATIVE,excision of posterior culdesac endometriosis;  Surgeon: Stevenson Elbe, MD;  Location: [facility] OR[facility];  Service: Gynecology;  Laterality: N/A;   WISDOM TOOTH EXTRACTION      Family History  Problem Relation Age of Onset   Anemia Mother    GER disease Mother    Hypertension Father    Hypertension Maternal Grandmother    Breast cancer Maternal Grandmother 50       over 20   Rheum arthritis Maternal Grandmother    Arthritis Maternal Grandmother    Hypertension Maternal Grandfather    Clotting disorder Maternal Grandfather    Skin cancer Maternal Grandfather    Breast cancer Paternal Grandmother    Hypertension Paternal Grandfather    Lung cancer Paternal Grandfather    Asthma Maternal Aunt    Other Neg Hx     Social History:  reports  that she has never smoked. She has never used smokeless tobacco. She reports that she does not drink alcohol and does not use drugs.  Allergies: No Known Allergies  Medications: I have reviewed the patient's current medications.  The PMH, PSH, Medications, Allergies, and SH were reviewed and updated.  ROS: Constitutional: Negative for fever, weight loss and weight gain. Cardiovascular: Negative for chest pain and dyspnea on exertion. Respiratory: Is not experiencing shortness of breath at rest. Gastrointestinal: Negative for nausea and  vomiting. Neurological: Negative for headaches. Psychiatric: The patient is not nervous/anxious  Last menstrual period 02/22/2024, currently breastfeeding. There is no height or weight on file to calculate BMI.  PHYSICAL EXAM:  Exam: General: Well-developed, well-nourished Communication and Voice: Clear pitch and clarity Respiratory Respiratory effort: Equal inspiration and expiration without stridor Cardiovascular Peripheral Vascular: Warm extremities with equal color/perfusion Eyes: No nystagmus with equal extraocular motion bilaterally Neuro/Psych/Balance: Patient oriented to person, place, and time; Appropriate mood and affect; Gait is intact with no imbalance; Cranial nerves I-XII are intact Head and Face Inspection: Normocephalic and atraumatic without mass or lesion Palpation: Facial skeleton intact without bony stepoffs Salivary Glands: No mass or tenderness Facial Strength: Facial motility symmetric and full bilaterally ENT Pinna: External ear intact and fully developed External canal: Canal is patent with intact skin Tympanic Membrane: Clear and mobile External Nose: No scar or anatomic deformity Internal Nose: Septum is relatively straight on anterior rhinoscopy. No polyp, or purulence. Mucosal edema and erythema present.  Bilateral inferior turbinate hypertrophy.  Lips, Teeth, and gums: Mucosa and teeth intact and viable TMJ: No pain to palpation with full mobility Oral cavity/oropharynx: No erythema or exudate, no lesions present Neck Neck and Trachea: Midline trachea without mass or lesion Thyroid : palpable left thyroid  nodule  Lymphatics: No lymphadenopathy   Studies Reviewed: Thyroid  U/S 07/28/24 FINDINGS: Parenchymal Echotexture: Moderately heterogenous   Isthmus: 0.5 cm   Right lobe: 6.9 x 1.6 x 2.0 cm   Left lobe: 8.6 x 4.3 x 4.7 cm   _________________________________________________________   Estimated total number of nodules >/= 1 cm: 1    Number of spongiform nodules >/=  2 cm not described below (TR1): 0   Number of mixed cystic and solid nodules >/= 1.5 cm not described below (TR2): 0   _________________________________________________________   Nodule labeled 1 is a large mixed cystic and solid isoechoic TR 2 nodule in the inferior left thyroid  lobe measuring 6.1 x 5.1 x 4.2 cm, previously 5.2 cm. This nodule does NOT meet TI-RADS criteria for biopsy or dedicated follow-up.   IMPRESSION: 1. Enlarged and heterogeneous thyroid  gland. 2. Similar to slightly enlarged appearance of solitary benign-appearing TR 2 nodule in the inferior left thyroid  lobe, measuring up to 6.1 cm on today's exam, previously 5.2 cm in 2022. This nodule does not meet criteria for further dedicated follow-up or biopsy. However, if symptomatic, consider referral to interventional radiology for image guided aspiration.  Assessment/Plan: Encounter Diagnoses  Name Primary?   Thyroid  nodule Yes   Thyroid  goiter     Assessment and Plan Assessment & Plan Thyroid  nodule Thyroid  nodule increased to 6.2 cm, TR2 classification, benign appearance, no indication for FNA, no lymphadenopathy. She has might pressure intermittently, but no significant compressive symptoms. Radiofrequency ablation considered but patient apprehensive. She will research her options and will let me know how she would like to proceed, but if she elects to do nothing and observe for now, we agreed that she will see PCP for repeat thyroid  U/S  in 12 months.  - Provided information on thyroid  surgery, emphasizing lobectomy would be the only option we would consider  - Included YouTube link on radiofrequency ablation in after visit summary. - Advised annual ultrasound monitoring. - Discussed radiofrequency ablation with interventional radiology if interested. - Consider thyroid  surgery if pressure symptoms worsen.  Gastroesophageal reflux disease (GERD) Throat clearing likely  due to reflux irritation, not thyroid  nodule. Continue management of GERD   Thank you for allowing me to participate in the care of this patient. Please do not hesitate to contact me with any questions or concerns.   Artice Last, MD Otolaryngology [facility] Phone: [phone] Fax: [phone]    03/05/2024, 1:08 PM

## 2024-03-18 ENCOUNTER — Ambulatory Visit (INDEPENDENT_AMBULATORY_CARE_PROVIDER_SITE_OTHER): Admitting: Otolaryngology

## 2024-04-02 ENCOUNTER — Ambulatory Visit (INDEPENDENT_AMBULATORY_CARE_PROVIDER_SITE_OTHER): Admitting: Otolaryngology

## 2024-05-12 ENCOUNTER — Encounter: Payer: Self-pay | Admitting: Nurse Practitioner

## 2024-05-12 ENCOUNTER — Ambulatory Visit: Admitting: Nurse Practitioner

## 2024-05-12 VITALS — BP 120/76 | HR 87 | Temp 98.8°F | Ht 68.0 in | Wt 172.0 lb

## 2024-05-12 DIAGNOSIS — Z139 Encounter for screening, unspecified: Secondary | ICD-10-CM | POA: Diagnosis not present

## 2024-05-12 DIAGNOSIS — R519 Headache, unspecified: Secondary | ICD-10-CM | POA: Insufficient documentation

## 2024-05-12 DIAGNOSIS — Z79899 Other long term (current) drug therapy: Secondary | ICD-10-CM | POA: Diagnosis not present

## 2024-05-12 DIAGNOSIS — E049 Nontoxic goiter, unspecified: Secondary | ICD-10-CM | POA: Diagnosis not present

## 2024-05-12 DIAGNOSIS — Z13220 Encounter for screening for lipoid disorders: Secondary | ICD-10-CM | POA: Diagnosis not present

## 2024-05-12 DIAGNOSIS — Z0000 Encounter for general adult medical examination without abnormal findings: Secondary | ICD-10-CM | POA: Diagnosis not present

## 2024-05-12 DIAGNOSIS — Z136 Encounter for screening for cardiovascular disorders: Secondary | ICD-10-CM

## 2024-05-12 DIAGNOSIS — E663 Overweight: Secondary | ICD-10-CM | POA: Diagnosis not present

## 2024-05-12 DIAGNOSIS — K5900 Constipation, unspecified: Secondary | ICD-10-CM | POA: Diagnosis not present

## 2024-05-12 DIAGNOSIS — Z01419 Encounter for gynecological examination (general) (routine) without abnormal findings: Secondary | ICD-10-CM

## 2024-05-12 DIAGNOSIS — E559 Vitamin D deficiency, unspecified: Secondary | ICD-10-CM

## 2024-05-12 HISTORY — DX: Headache, unspecified: R51.9

## 2024-05-12 NOTE — Progress Notes (Signed)
 LILLETTE [name], CMA,acting as a Neurosurgeon for Gaines Ada, FNP.,have documented all relevant documentation on the behalf of Gaines Ada, FNP,as directed by  Gaines Ada, FNP while in the presence of Gaines Ada, FNP.  Subjective:    Patient ID: Stacey Key , female    DOB: Nov 24, 1985 , 38 y.o.   MRN: [id number]  Chief Complaint  Patient presents with   Annual Exam    Patient presents today for HM, Patient reports compliance with medication. Patient denies any chest pain, SOB, or headaches. Patient was followed by [facility] but they no longer take her ins. Patient has no concerns today.     HPI Discussed the use of AI scribe software for clinical note transcription with the patient, who gave verbal consent to proceed.  History of Present Illness Karelly L Abree Romick is a 38 year old female who presents for an annual physical exam.  She has been evaluated by a ENT specialist in June and was presented with options of radiation, mini surgery, or observation. She has opted for a watchful waiting approach as long as she can eat without discomfort.  Her last menstrual cycle was on April 21, 2024. She uses condoms for birth control and is actively trying to conceive another child. She has one child who recently turned one year old.  She has a craving for sugar, consuming cookies and ice cream, yet her weight has decreased to 172 pounds, which she attributes to breastfeeding. She is currently breastfeeding her one-year-old son and is in the process of weaning him.  She experiences migraines almost daily for the past three weeks, primarily located on one side behind her eye or at the temples. She uses Tylenol  for pain relief when necessary. She has a history of migraines but has not used prescription medication for them.  She experiences occasional sharp chest pains about twice a month, which are brief. No shortness of breath, swelling in her feet or ankles, or increased urination or  thirst.  She reports occasional constipation despite a high vegetable intake and participating in a water  challenge, consuming about 70 ounces of water  daily.  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   Cough 01/14/2020   Dysmenorrhea 08/22/2016   Endometriosis    GERD (gastroesophageal reflux disease)    GERD (gastroesophageal reflux disease) 11/13/2011   History of asthma 01/14/2020   History of COVID-19 01/14/2020   Ovarian cyst    Ovarian cyst, complex 05/05/2012   Left complex 5x 3cm ovarian cyst   Seasonal allergic rhinitis 08/16/2017   Vitamin D  deficiency    Wheezing 01/14/2020     Family History  Problem Relation Age of Onset   Anemia Mother    GER disease Mother    Hypertension Father    Hypertension Maternal Grandmother    Breast cancer Maternal Grandmother 50       over 30   Rheum arthritis Maternal Grandmother    Arthritis Maternal Grandmother    Hypertension Maternal Grandfather    Clotting disorder Maternal Grandfather    Skin cancer Maternal Grandfather    Breast cancer Paternal Grandmother    Hypertension Paternal Grandfather    Lung cancer Paternal Grandfather  Asthma Maternal Aunt    Other Neg Hx      Current Outpatient Medications:    albuterol  (VENTOLIN  HFA) 108 (90 Base) MCG/ACT inhaler, Inhale 2 puffs into the lungs every 6 (six) hours as needed for wheezing or shortness of breath., Disp: 8 g, Rfl: 2   azelastine  (ASTELIN ) 0.1 % nasal spray, Place 2 sprays into both nostrils 2 (two) times daily. Use in each nostril as directed, Disp: 30 mL, Rfl: 1   cetirizine  (ZYRTEC ) 10 MG tablet, Take 1 tablet (10 mg total) by mouth daily. (Patient not taking: Reported on 05/12/2024), Disp: 30 tablet, Rfl: 11   fluticasone  (FLONASE ) 50 MCG/ACT nasal  spray, Place 2 sprays into both nostrils daily. (Patient not taking: Reported on 05/12/2024), Disp: 16 g, Rfl: 2   montelukast  (SINGULAIR ) 10 MG tablet, Take 1 tablet (10 mg total) by mouth at bedtime. (Patient not taking: Reported on 05/12/2024), Disp: 90 tablet, Rfl: 1   omeprazole  (PRILOSEC) 20 MG capsule, Take 1 capsule (20 mg total) by mouth daily. (Patient not taking: Reported on 05/12/2024), Disp: 30 capsule, Rfl: 0   ondansetron  (ZOFRAN ) 4 MG tablet, Take 1 tablet by mouth 3 (three) times daily as needed. (Patient not taking: Reported on 05/12/2024), Disp: , Rfl:    Prenatal Vit-Fe Fumarate-FA (PRENATAL MULTIVITAMIN) TABS tablet, Take 1 tablet by mouth daily at 12 noon. (Patient not taking: Reported on 05/12/2024), Disp: , Rfl:    No Known Allergies    The patient states she uses condoms for birth control. Patient's last menstrual period was 04/21/2024.  Negative for Dysmenorrhea and Negative for Menorrhagia. Negative for: breast discharge, breast lump(s), breast pain and breast self exam. Associated symptoms include abnormal vaginal bleeding. Pertinent negatives include abnormal bleeding (hematology), anxiety, decreased libido, depression, difficulty falling sleep, dyspareunia, history of infertility, nocturia, sexual dysfunction, sleep disturbances, urinary incontinence, urinary urgency, vaginal discharge and vaginal itching. Diet regular.The patient states her exercise level is    . The patient's tobacco use is:  Social History   Tobacco Use  Smoking Status Never  Smokeless Tobacco Never  . She has been exposed to passive smoke. The patient's alcohol use is:  Social History   Substance and Sexual Activity  Alcohol Use No  Additional information: Last pap 09/05/2021, next one scheduled for 09/05/2024.    Review of Systems  Constitutional: Negative.   HENT: Negative.    Eyes: Negative.   Respiratory: Negative.    Cardiovascular: Negative.   Gastrointestinal: Negative.   Endocrine:  Negative.   Genitourinary: Negative.   Musculoskeletal: Negative.   Skin: Negative.   Allergic/Immunologic: Negative.   Neurological: Negative.   Hematological: Negative.   Psychiatric/Behavioral: Negative.       Today's Vitals   05/12/24 1406  BP: 120/76  Pulse: 87  Temp: 98.8 F (37.1 C)  TempSrc: Oral  Weight: 172 lb (78 kg)  Height: 5' 8 (1.727 m)  PainSc: 0-No pain   Body mass index is 26.15 kg/m.  Wt Readings from Last 3 Encounters:  05/12/24 172 lb (78 kg)  02/25/24 173 lb 9.6 oz (78.7 kg)  12/30/23 176 lb (79.8 kg)     Objective:  Physical Exam Vitals and nursing note reviewed.  Constitutional:      General: She is not in acute distress.    Appearance: Normal appearance. She is well-developed.  HENT:     Head: Normocephalic and atraumatic.     Right Ear: Hearing, tympanic membrane, ear canal and external ear normal. There is  no impacted cerumen.     Left Ear: Hearing, tympanic membrane, ear canal and external ear normal. There is no impacted cerumen.     Ears:     Comments: Erythematous canal    Nose: Nose normal.     Mouth/Throat:     Mouth: Mucous membranes are moist.  Eyes:     General: Lids are normal.     Extraocular Movements: Extraocular movements intact.     Conjunctiva/sclera: Conjunctivae normal.     Pupils: Pupils are equal, round, and reactive to light.     Funduscopic exam:    Right eye: No papilledema.        Left eye: No papilledema.  Neck:     Thyroid : No thyroid  mass.     Vascular: No carotid bruit.  Cardiovascular:     Rate and Rhythm: Normal rate and regular rhythm.     Pulses: Normal pulses.     Heart sounds: Normal heart sounds. No murmur heard. Pulmonary:     Effort: Pulmonary effort is normal. No respiratory distress.     Breath sounds: Normal breath sounds. No wheezing.  Chest:     Chest wall: No mass.  Breasts:    Tanner Score is 5.     Right: Normal. No mass or tenderness.     Left: Normal. No mass or tenderness.   Abdominal:     General: Abdomen is flat. Bowel sounds are normal. There is no distension.     Palpations: Abdomen is soft.     Tenderness: There is no abdominal tenderness.  Genitourinary:    Rectum: Guaiac result negative.  Musculoskeletal:        General: No swelling. Normal range of motion.     Cervical back: Normal, full passive range of motion without pain, normal range of motion and neck supple.     Right lower leg: No edema.     Left lower leg: No edema.     Comments:    Lymphadenopathy:     Upper Body:     Right upper body: No supraclavicular, axillary or pectoral adenopathy.     Left upper body: No supraclavicular, axillary or pectoral adenopathy.  Skin:    General: Skin is warm and dry.     Capillary Refill: Capillary refill takes less than 2 seconds.  Neurological:     General: No focal deficit present.     Mental Status: She is alert and oriented to person, place, and time.     Cranial Nerves: No cranial nerve deficit.     Sensory: No sensory deficit.     Motor: No weakness.  Psychiatric:        Mood and Affect: Mood normal.        Behavior: Behavior normal.        Thought Content: Thought content normal.        Judgment: Judgment normal.      Assessment And Plan:     Encounter for annual health examination Assessment & Plan: Breastfeeding contributing to weight loss. No significant family history of heart disease. No significant symptoms of concern. Intermittent constipation and increased migraines noted. - Check A1c for changes since last evaluation. - Encourage increased fiber intake to curb sugar cravings. - Continue current exercise routine, including weekend walks. - Monitor dietary habits, particularly sugar intake. - Encourage water  intake, aiming for 80 ounces per day. - Check labs for overall health status.  Orders: -     CMP14+EGFR  Vitamin D  deficiency  Assessment & Plan: Will check vitamin D  level and supplement as needed.    Also  encouraged to spend 15 minutes in the sun daily.    Orders: -     VITAMIN D  25 Hydroxy (Vit-D Deficiency, Fractures)  Goiter Assessment & Plan: Has seen ENT recently and prefers monitoring as long as asymptomatic and not affecting eating.  Orders: -     TSH + free T4  Nonintractable episodic headache, unspecified headache type Assessment & Plan: Increased migraine frequency over three weeks, almost daily. Pain localized behind the eye and temples. Managed with Tylenol . Prefers to avoid prescription medications due to breastfeeding. - Encourage reduction of sugar intake to assess impact on migraines. - Monitor for dietary triggers, including avocados and certain sandwich meats. - Continue using Tylenol  as needed. - Consider further evaluation if migraines worsen or require more than Tylenol .   Constipation, unspecified constipation type Assessment & Plan: Intermittent constipation with recent difficulty. Consumes vegetables and increasing water  intake. - Continue water  challenge, aiming for 80 ounces per day. - Monitor dietary fiber intake.   Other long term (current) drug therapy -     CBC with Differential/Platelet  Encounter for gynecological examination -     Ambulatory referral to Obstetrics / Gynecology  Encounter for lipid screening for cardiovascular disease -     Lipid panel  Overweight (BMI 25.0-29.9) -     Hemoglobin A1c  Encounter for screening -     Hepatitis B surface antibody,qualitative      Return for 1 year physical. Patient was given opportunity to ask questions. Patient verbalized understanding of the plan and was able to repeat key elements of the plan. All questions were answered to their satisfaction.   Gaines Ada, FNP   I, Gaines Ada, FNP, have reviewed all documentation for this visit. The documentation on 05/12/24 for the exam, diagnosis, procedures, and orders are all accurate and complete.

## 2024-05-12 NOTE — Patient Instructions (Signed)

## 2024-05-13 LAB — CMP14+EGFR
ALT: 9 IU/L (ref 0–32)
AST: 16 IU/L (ref 0–40)
Albumin: 4.3 g/dL (ref 3.9–4.9)
Alkaline Phosphatase: 76 IU/L (ref 44–121)
BUN/Creatinine Ratio: 10 (ref 9–23)
BUN: 6 mg/dL (ref 6–20)
Bilirubin Total: 0.2 mg/dL (ref 0.0–1.2)
CO2: 21 mmol/L (ref 20–29)
Calcium: 9 mg/dL (ref 8.7–10.2)
Chloride: 104 mmol/L (ref 96–106)
Creatinine, Ser: 0.62 mg/dL (ref 0.57–1.00)
Globulin, Total: 3.4 g/dL (ref 1.5–4.5)
Glucose: 100 mg/dL — ABNORMAL HIGH (ref 70–99)
Potassium: 4.3 mmol/L (ref 3.5–5.2)
Sodium: 139 mmol/L (ref 134–144)
Total Protein: 7.7 g/dL (ref 6.0–8.5)
eGFR: 119 mL/min/1.73 (ref >59)

## 2024-05-13 LAB — CBC WITH DIFFERENTIAL/PLATELET
Basophils Absolute: 0 x10E3/uL (ref 0.0–0.2)
Basos: 1 %
EOS (ABSOLUTE): 0.1 x10E3/uL (ref 0.0–0.4)
Eos: 1 %
Hematocrit: 35.2 % (ref 34.0–46.6)
Hemoglobin: 11 g/dL — ABNORMAL LOW (ref 11.1–15.9)
Immature Grans (Abs): 0 x10E3/uL (ref 0.0–0.1)
Immature Granulocytes: 0 %
Lymphocytes Absolute: 1.8 x10E3/uL (ref 0.7–3.1)
Lymphs: 41 %
MCH: 26.3 pg — ABNORMAL LOW (ref 26.6–33.0)
MCHC: 31.3 g/dL — ABNORMAL LOW (ref 31.5–35.7)
MCV: 84 fL (ref 79–97)
Monocytes Absolute: 0.5 x10E3/uL (ref 0.1–0.9)
Monocytes: 10 %
Neutrophils Absolute: 2 x10E3/uL (ref 1.4–7.0)
Neutrophils: 47 %
Platelets: 340 x10E3/uL (ref 150–450)
RBC: 4.18 x10E6/uL (ref 3.77–5.28)
RDW: 12.9 % (ref 11.7–15.4)
WBC: 4.4 x10E3/uL (ref 3.4–10.8)

## 2024-05-13 LAB — LIPID PANEL
Chol/HDL Ratio: 2.5 ratio (ref 0.0–4.4)
Cholesterol, Total: 135 mg/dL (ref 100–199)
HDL: 55 mg/dL (ref >39)
LDL Chol Calc (NIH): 67 mg/dL (ref 0–99)
Triglycerides: 64 mg/dL (ref 0–149)
VLDL Cholesterol Cal: 13 mg/dL (ref 5–40)

## 2024-05-13 LAB — TSH+FREE T4
Free T4: 0.82 ng/dL (ref 0.82–1.77)
TSH: 1.81 u[IU]/mL (ref 0.450–4.500)

## 2024-05-13 LAB — HEMOGLOBIN A1C
Est. average glucose Bld gHb Est-mCnc: 111 mg/dL
Hgb A1c MFr Bld: 5.5 % (ref 4.8–5.6)

## 2024-05-13 LAB — VITAMIN D 25 HYDROXY (VIT D DEFICIENCY, FRACTURES): Vit D, 25-Hydroxy: 26.4 ng/mL — ABNORMAL LOW (ref 30.0–100.0)

## 2024-05-13 LAB — HEPATITIS B SURFACE ANTIBODY,QUALITATIVE: Hep B Surface Ab, Qual: REACTIVE

## 2024-05-14 ENCOUNTER — Ambulatory Visit (INDEPENDENT_AMBULATORY_CARE_PROVIDER_SITE_OTHER): Admitting: Otolaryngology

## 2024-05-28 ENCOUNTER — Ambulatory Visit: Payer: Self-pay | Admitting: Nurse Practitioner

## 2024-05-28 DIAGNOSIS — K5900 Constipation, unspecified: Secondary | ICD-10-CM | POA: Insufficient documentation

## 2024-05-28 HISTORY — DX: Constipation, unspecified: K59.00

## 2024-05-28 NOTE — Assessment & Plan Note (Signed)
 Intermittent constipation with recent difficulty. Consumes vegetables and increasing water  intake. - Continue water  challenge, aiming for 80 ounces per day. - Monitor dietary fiber intake.

## 2024-05-28 NOTE — Assessment & Plan Note (Signed)
 Has seen ENT recently and prefers monitoring as long as asymptomatic and not affecting eating.

## 2024-05-28 NOTE — Assessment & Plan Note (Signed)
 Will check vitamin D  level and supplement as needed.    Also encouraged to spend 15 minutes in the sun daily.

## 2024-05-28 NOTE — Assessment & Plan Note (Signed)
 Increased migraine frequency over three weeks, almost daily. Pain localized behind the eye and temples. Managed with Tylenol . Prefers to avoid prescription medications due to breastfeeding. - Encourage reduction of sugar intake to assess impact on migraines. - Monitor for dietary triggers, including avocados and certain sandwich meats. - Continue using Tylenol  as needed. - Consider further evaluation if migraines worsen or require more than Tylenol .

## 2024-05-28 NOTE — Assessment & Plan Note (Signed)
 Breastfeeding contributing to weight loss. No significant family history of heart disease. No significant symptoms of concern. Intermittent constipation and increased migraines noted. - Check A1c for changes since last evaluation. - Encourage increased fiber intake to curb sugar cravings. - Continue current exercise routine, including weekend walks. - Monitor dietary habits, particularly sugar intake. - Encourage water  intake, aiming for 80 ounces per day. - Check labs for overall health status.

## 2024-07-01 ENCOUNTER — Other Ambulatory Visit: Payer: Self-pay | Admitting: Medical Genetics

## 2024-07-29 ENCOUNTER — Encounter: Payer: Commercial Managed Care - PPO | Admitting: Nurse Practitioner

## 2024-07-30 ENCOUNTER — Other Ambulatory Visit (HOSPITAL_BASED_OUTPATIENT_CLINIC_OR_DEPARTMENT_OTHER): Payer: Self-pay

## 2024-07-30 MED ORDER — ALBUTEROL SULFATE HFA 108 (90 BASE) MCG/ACT IN AERS
2.0000 | INHALATION_SPRAY | Freq: Four times a day (QID) | RESPIRATORY_TRACT | 2 refills | Status: DC | PRN
  Filled 2024-07-30: qty 8.5, 30d supply, fill #0

## 2024-08-01 ENCOUNTER — Other Ambulatory Visit (HOSPITAL_BASED_OUTPATIENT_CLINIC_OR_DEPARTMENT_OTHER): Payer: Self-pay

## 2024-08-04 ENCOUNTER — Other Ambulatory Visit

## 2024-08-04 DIAGNOSIS — Z006 Encounter for examination for normal comparison and control in clinical research program: Secondary | ICD-10-CM

## 2024-08-10 ENCOUNTER — Other Ambulatory Visit (HOSPITAL_BASED_OUTPATIENT_CLINIC_OR_DEPARTMENT_OTHER): Payer: Self-pay

## 2024-08-10 ENCOUNTER — Ambulatory Visit: Admitting: Family Medicine

## 2024-08-10 VITALS — BP 102/78 | HR 92 | Temp 98.1°F | Ht 68.0 in | Wt 172.7 lb

## 2024-08-10 DIAGNOSIS — R519 Headache, unspecified: Secondary | ICD-10-CM | POA: Diagnosis not present

## 2024-08-10 DIAGNOSIS — R0981 Nasal congestion: Secondary | ICD-10-CM | POA: Diagnosis not present

## 2024-08-10 DIAGNOSIS — J020 Streptococcal pharyngitis: Secondary | ICD-10-CM

## 2024-08-10 LAB — POCT RAPID STREP A (OFFICE): Rapid Strep A Screen: POSITIVE — AB

## 2024-08-10 LAB — POC COVID19 BINAXNOW: SARS Coronavirus 2 Ag: NEGATIVE

## 2024-08-10 MED ORDER — AMOXICILLIN 250 MG/5ML PO SUSR
500.0000 mg | Freq: Three times a day (TID) | ORAL | 0 refills | Status: AC
Start: 2024-08-10 — End: 2024-08-20
  Filled 2024-08-10: qty 300, 10d supply, fill #0

## 2024-08-10 NOTE — Progress Notes (Unsigned)
 Established Patient Office Visit  Subjective   Patient ID: Stacey Key, female    DOB: [birth year]  Age: 38 y.o. MRN: [id number]  Chief Complaint  Patient presents with  . Headache    X1 day  . Sore Throat    X1 day  . Dizziness    Intermittently for years, worse past day with other symptoms  . Nasal Congestion    Headache  Associated symptoms include dizziness.  Sore Throat  Associated symptoms include headaches.  Dizziness Associated symptoms include headaches.     Current Outpatient Medications  Medication Instructions  . albuterol  (VENTOLIN  HFA) 108 (90 Base) MCG/ACT inhaler 2 puffs, Inhalation, Every 6 hours PRN  . albuterol  (VENTOLIN  HFA) 108 (90 Base) MCG/ACT inhaler Inhale 2 puffs into the lungs every 6 (six) hours as needed for wheezing or shortness of breath  . amoxicillin  (AMOXIL ) 500 mg, Oral, 3 times daily  . azelastine  (ASTELIN ) 0.1 % nasal spray 2 sprays, Each Nare, 2 times daily, Use in each nostril as directed    Patient Active Problem List   Diagnosis Date Noted  . Constipation 05/28/2024  . Nonintractable episodic headache 05/12/2024  . Acute cough 02/29/2024  . Sore throat 02/29/2024  . Sinus pressure 02/29/2024  . Thyroid  nodule 12/30/2023  . Irregular periods 07/31/2023  . Menorrhagia 07/31/2023  . Breast lump 07/31/2023  . Keloid scar 07/31/2023  . Encounter for annual health examination 07/25/2023  . Vitamin D  deficiency 07/25/2023  . Influenza vaccination declined 07/25/2023  . COVID-19 vaccination declined 07/25/2023  . Chronic midline thoracic back pain 07/25/2023  . Migraine without aura and without status migrainosus, not intractable 07/25/2023  . Immunization due 07/25/2023  . Chest congestion 05/06/2023  . Nasal congestion 05/06/2023  . Delivery by emergency cesarean section 04/30/2023  . Uterine leiomyoma 02/19/2023  . Anemia 02/04/2023  . Goiter 10/16/2021  . Moderate persistent asthma 08/16/2017  . Endometriosis  10/05/2016  . Pain in pelvis 08/22/2016  . Encounter for initial insertion of intrauterine contraceptive device 04/20/2016  . Cyst of left ovary 05/05/2012     Review of Systems  Neurological:  Positive for dizziness and headaches.  All other systems reviewed and are negative.     Objective:     BP 102/78   Pulse 92   Temp 98.1 F (36.7 C) (Oral)   Ht 5' 8 (1.727 m)   Wt 172 lb 11.2 oz (78.3 kg)   LMP 07/30/2024 (Exact Date)   SpO2 98%   BMI 26.26 kg/m  {Vitals History (Optional):23777}  Physical Exam Vitals reviewed.  Constitutional:      Appearance: Normal appearance. She is well-groomed and normal weight.  Eyes:     Conjunctiva/sclera: Conjunctivae normal.  Neck:     Thyroid : No thyromegaly.  Cardiovascular:     Rate and Rhythm: Normal rate and regular rhythm.     Heart sounds: S1 normal and S2 normal.  Pulmonary:     Effort: Pulmonary effort is normal.     Breath sounds: Normal breath sounds and air entry.  Abdominal:     General: Bowel sounds are normal.  Neurological:     Mental Status: She is alert and oriented to person, place, and time. Mental status is at baseline.     Gait: Gait is intact.  Psychiatric:        Mood and Affect: Mood and affect normal.        Speech: Speech normal.        Behavior:  Behavior normal.        Judgment: Judgment normal.      Results for orders placed or performed in visit on 08/10/24  POC COVID-19  Result Value Ref Range   SARS Coronavirus 2 Ag Negative Negative  POC Rapid Strep A  Result Value Ref Range   Rapid Strep A Screen Positive (A) Negative    {Labs (Optional):23779}  The ASCVD Risk score (Arnett DK, et al., 2019) failed to calculate for the following reasons:   The 2019 ASCVD risk score is only valid for ages 73 to 70    Assessment & Plan:  Strep pharyngitis -     POC COVID-19 BinaxNow -     POCT rapid strep A -     Amoxicillin ; Take 10 mLs (500 mg total) by mouth 3 (three) times daily for 10  days.  Dispense: 300 mL; Refill: 0  Nonintractable headache, unspecified chronicity pattern, unspecified headache type -     POC COVID-19 BinaxNow  Nasal congestion -     POC COVID-19 BinaxNow     No follow-ups on file.    Heron CHRISTELLA Sharper, MD

## 2024-08-18 LAB — GENECONNECT MOLECULAR SCREEN: Genetic Analysis Overall Interpretation: NEGATIVE

## 2024-09-21 ENCOUNTER — Ambulatory Visit: Admitting: Family Medicine

## 2024-09-21 ENCOUNTER — Encounter: Payer: Self-pay | Admitting: Family Medicine

## 2024-09-21 ENCOUNTER — Other Ambulatory Visit (HOSPITAL_BASED_OUTPATIENT_CLINIC_OR_DEPARTMENT_OTHER): Payer: Self-pay

## 2024-09-21 VITALS — BP 124/78 | HR 83 | Temp 98.2°F | Ht 68.0 in | Wt 176.0 lb

## 2024-09-21 DIAGNOSIS — J069 Acute upper respiratory infection, unspecified: Secondary | ICD-10-CM

## 2024-09-21 MED ORDER — PROMETHAZINE-DM 6.25-15 MG/5ML PO SYRP
5.0000 mL | ORAL_SOLUTION | Freq: Four times a day (QID) | ORAL | 0 refills | Status: AC | PRN
  Filled 2024-09-21: qty 118, 6d supply, fill #0

## 2024-09-21 MED ORDER — AZITHROMYCIN 200 MG/5ML PO SUSR
500.0000 mg | Freq: Every day | ORAL | 0 refills | Status: AC
  Filled 2024-09-21: qty 90, 7d supply, fill #0

## 2024-09-21 MED ORDER — ALBUTEROL SULFATE HFA 108 (90 BASE) MCG/ACT IN AERS
2.0000 | INHALATION_SPRAY | Freq: Four times a day (QID) | RESPIRATORY_TRACT | 2 refills | Status: AC | PRN
  Filled 2024-09-21: qty 8.5, 30d supply, fill #0

## 2024-09-21 NOTE — Progress Notes (Unsigned)
" ° °  Acute Office Visit   Subjective:  Patient ID: Stacey Key, female    DOB: 05-Oct-1985, 38 y.o.   MRN: [id number]  Chief Complaint  Patient presents with   Cough   Nasal Congestion   Headache    HPI Patient is here for an acute visit.   Patient is having the following symptoms:  Sore throat Cough-productive with sputum changing from dark green to yellowish-white; thick but is thinner Nasal congestion /drainage  Chest pain only when coughing  Sore throat- first three days  Headache-started today  Neck soreness  Denies SHOB, ear pain/pressure/drainage, dizziness, lightheaded, fever, body aches, loss of appetite.   Symptoms started last Monday evening.   Covid test completed on Tuesday which was negative.   Patients spouse has been having similar symptoms.   Patient has tried Mucinex Cold and Flu and Severe Cough with mild relief.  ROS See HPI above      Objective:    BP 124/78   Pulse 83   Temp 98.2 F (36.8 C) (Oral)   Ht 5' 8 (1.727 m)   Wt 176 lb (79.8 kg)   SpO2 95%   BMI 26.76 kg/m  {Vitals History (Optional):23777}  Physical Exam  No results found for any visits on 09/21/24.      Assessment & Plan:  Upper respiratory tract infection, unspecified type -     Albuterol  Sulfate HFA; Inhale 2 puffs into the lungs every 6 (six) hours as needed for wheezing or shortness of breath  Dispense: 8.5 g; Refill: 2 -     Azithromycin ; Take 12.5 mLs (500 mg total) by mouth daily for 5 days.  Dispense: 62.5 mL; Refill: 0 -     Promethazine -DM; Take 5 mLs by mouth 4 (four) times daily as needed.  Dispense: 118 mL; Refill: 0    No follow-ups on file.  Erlene Devita, NP "

## 2024-09-22 NOTE — Patient Instructions (Addendum)
-  It was a pleasure to care for you.  -Due to longevity of symptoms with no improvement, prescribed Azithromycin  12.17mLs daily for 5 days for any secondary upper respiratory infection.  -Also, prescribed Promethazine -DM syrup for cough and to provide rest at night. May take 5mLs every 6 hours as needed. Caution: Medication can cause drowsiness.  -Refilled Albuterol  Inhaler to use for wheezing, shortness of breath, or coughing.  -Rest, hydrate.  -Follow up if not improved.

## 2024-09-23 ENCOUNTER — Other Ambulatory Visit (HOSPITAL_BASED_OUTPATIENT_CLINIC_OR_DEPARTMENT_OTHER): Payer: Self-pay

## 2024-10-02 ENCOUNTER — Other Ambulatory Visit (HOSPITAL_BASED_OUTPATIENT_CLINIC_OR_DEPARTMENT_OTHER): Payer: Self-pay

## 2024-10-22 ENCOUNTER — Encounter: Payer: Self-pay | Admitting: Nurse Practitioner

## 2024-10-22 ENCOUNTER — Telehealth: Admitting: Nurse Practitioner

## 2024-10-22 DIAGNOSIS — F32A Depression, unspecified: Secondary | ICD-10-CM

## 2024-10-22 DIAGNOSIS — F419 Anxiety disorder, unspecified: Secondary | ICD-10-CM

## 2024-10-22 NOTE — Assessment & Plan Note (Signed)
 Her depression screen score is 13 and anxiety score is 11, these are both up from 0 just a month ago. She would like to go to counseling to help with her mental health. Advised if has worsening thoughts or wanting to harm self or someone else to go to the [facility]

## 2024-10-22 NOTE — Progress Notes (Signed)
 "  Virtual Visit via Video Note  Stacey Stacey, CMA,acting as a scribe for Stacey Ada, FNP.,have documented all relevant documentation on the behalf of Stacey Ada, FNP,as directed by  Stacey Ada, FNP while in the presence of Stacey Ada, FNP.  I connected with Stacey Stacey on 10/22/24 at 11:20 AM EST by a video enabled telemedicine application and verified that I am speaking with the correct person using two identifiers.  Patient Location: Other:  [employer]: Office/Clinic  I discussed the limitations, risks, security, and privacy concerns of performing an evaluation and management service by video and the availability of in person appointments. I also discussed with the patient that there may be a patient responsible charge related to this service. The patient expressed understanding and agreed to proceed.  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 Patient reports she had her baby July 2024. Patient reports she didn't realize it before but now she noticed she is very emotional and irritable. She reports she is not sleeping well due to her toddler and not having enough help at home.    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 She is overwhelmed and irritable. She is snapping at a lot of things. She is tired and exhausted.when waking up she will get nervousness and anxiousness. She will sometimes not sleep because she doesn't want to miss him waking. She is currently doing counseling with [facility] for her marriage.      ROS: Per HPI Current Medications[1]  Observations/Objective: There were no vitals filed for this visit. Physical Exam Vitals reviewed.  Constitutional:      General: She is not in acute distress.    Appearance: Normal appearance. She is obese.   Pulmonary:     Effort: Pulmonary effort is normal. No respiratory distress.  Skin:    Capillary Refill: Capillary refill takes less than 2 seconds.  Neurological:     General: No focal deficit present.     Mental Status: She is alert and oriented to person, place, and time.     Cranial Nerves: No cranial nerve deficit.  Psychiatric:        Mood and Affect: Mood normal.        Behavior: Behavior normal.        Thought Content: Thought content normal.        Judgment: Judgment normal.        10/22/2024   10:59 AM 09/21/2024    3:39 PM 05/12/2024    2:09 PM 07/25/2023    2:19 PM 07/25/2023    2:11 PM  Depression screen PHQ 2/9  Decreased Interest 1 0 0 0 0  Down, Depressed, Hopeless 1 0 0 1 0  PHQ - 2 Score 2 0 0 1 0  Altered sleeping 3 0 0 2 0  Tired, decreased energy 3 0 0 1   Change in appetite 3 0 0 1   Feeling bad or failure about yourself  0 0 0 0   Trouble concentrating 1 0 0 0   Moving slowly or fidgety/restless 1 0 0 0   Suicidal thoughts 0 0 0 0   PHQ-9 Score 13 0 0  5  0   Difficult doing work/chores Extremely dIfficult Not difficult at all Not difficult at all Somewhat difficult  Data saved with a previous flowsheet row definition      10/22/2024   11:02 AM 09/21/2024    3:40 PM 05/12/2024    2:09 PM 07/25/2023    2:20 PM  GAD 7 : Generalized Anxiety Score  Nervous, Anxious, on Edge 1 0  0  1   Control/stop worrying 0 0  0  1   Worry too much - different things 3 0  0  1   Trouble relaxing 3 0  0  1   Restless 0 0  0  0   Easily annoyed or irritable 3 0  0  0   Afraid - awful might happen 1 0  0  1   Total GAD 7 Score 11 0 0 5  Anxiety Difficulty Very difficult Not difficult at all Not difficult at all Somewhat difficult     Data saved with a previous flowsheet row definition    Assessment and Plan: Anxiety and depression Assessment & Plan: Her depression screen score is 13 and anxiety score is 11, these are both up from 0 just a month ago. She  would like to go to counseling to help with her mental health. Advised if has worsening thoughts or wanting to harm self or someone else to go to the Behavioral Health Urgent care  Orders: -     Ambulatory referral to Psychiatry    Follow Up Instructions: Return for keep same next.   I discussed the assessment and treatment plan with the patient. The patient was provided an opportunity to ask questions, and all were answered. The patient agreed with the plan and demonstrated an understanding of the instructions.   The patient was advised to call back or seek an in-person evaluation if the symptoms worsen or if the condition fails to improve as anticipated.  The above assessment and management plan was discussed with the patient. The patient verbalized understanding of and has agreed to the management plan.   Stacey Stacey Ada, FNP, have reviewed all documentation for this visit. The documentation on 10/22/24 for the exam, diagnosis, procedures, and orders are all accurate and complete.      [1]  Current Outpatient Medications:    albuterol  (VENTOLIN  HFA) 108 (90 Base) MCG/ACT inhaler, Inhale 2 puffs into the lungs every 6 (six) hours as needed for wheezing or shortness of breath, Disp: 8.5 g, Rfl: 2   azelastine  (ASTELIN ) 0.1 % nasal spray, Place 2 sprays into both nostrils 2 (two) times daily. Use in each nostril as directed, Disp: 30 mL, Rfl: 1   promethazine -dextromethorphan (PROMETHAZINE -DM) 6.25-15 MG/5ML syrup, Take 5 mLs by mouth 4 (four) times daily as needed., Disp: 118 mL, Rfl: 0  "

## 2024-11-10 ENCOUNTER — Ambulatory Visit (HOSPITAL_COMMUNITY): Payer: Self-pay

## 2025-05-13 ENCOUNTER — Encounter: Payer: Self-pay | Admitting: Nurse Practitioner
